# Patient Record
Sex: Female | Born: 1980 | Race: Black or African American | Hispanic: No | Marital: Married | Smoking: Never smoker
Health system: Southern US, Community
[De-identification: ages and names within clinical notes are randomized; demographics above are authoritative.]

## PROBLEM LIST (undated history)

## (undated) ENCOUNTER — Inpatient Hospital Stay (HOSPITAL_COMMUNITY): Payer: Self-pay

## (undated) ENCOUNTER — Emergency Department (HOSPITAL_COMMUNITY): Admission: EM | Payer: Medicaid Other | Source: Home / Self Care

## (undated) DIAGNOSIS — F329 Major depressive disorder, single episode, unspecified: Secondary | ICD-10-CM

## (undated) DIAGNOSIS — Z3490 Encounter for supervision of normal pregnancy, unspecified, unspecified trimester: Secondary | ICD-10-CM

## (undated) DIAGNOSIS — I10 Essential (primary) hypertension: Secondary | ICD-10-CM

## (undated) DIAGNOSIS — D573 Sickle-cell trait: Secondary | ICD-10-CM

## (undated) DIAGNOSIS — F32A Depression, unspecified: Secondary | ICD-10-CM

## (undated) HISTORY — PX: WISDOM TOOTH EXTRACTION: SHX21

## (undated) HISTORY — DX: Depression, unspecified: F32.A

## (undated) HISTORY — DX: Major depressive disorder, single episode, unspecified: F32.9

## (undated) HISTORY — PX: TUBAL LIGATION: SHX77

---
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
## 2000-02-29 ENCOUNTER — Inpatient Hospital Stay (HOSPITAL_COMMUNITY): Admission: EM | Admit: 2000-02-29 | Discharge: 2000-02-29 | Payer: Self-pay | Admitting: Obstetrics & Gynecology

## 2000-04-02 ENCOUNTER — Emergency Department (HOSPITAL_COMMUNITY): Admission: EM | Admit: 2000-04-02 | Discharge: 2000-04-02 | Payer: Self-pay

## 2000-04-19 ENCOUNTER — Emergency Department (HOSPITAL_COMMUNITY): Admission: EM | Admit: 2000-04-19 | Discharge: 2000-04-19 | Payer: Self-pay | Admitting: Emergency Medicine

## 2000-04-26 ENCOUNTER — Encounter: Admission: RE | Admit: 2000-04-26 | Discharge: 2000-04-26 | Payer: Self-pay | Admitting: Family Medicine

## 2000-05-25 ENCOUNTER — Encounter: Admission: RE | Admit: 2000-05-25 | Discharge: 2000-05-25 | Payer: Self-pay | Admitting: Family Medicine

## 2000-05-25 ENCOUNTER — Emergency Department (HOSPITAL_COMMUNITY): Admission: EM | Admit: 2000-05-25 | Discharge: 2000-05-25 | Payer: Self-pay | Admitting: Emergency Medicine

## 2000-11-07 ENCOUNTER — Emergency Department (HOSPITAL_COMMUNITY): Admission: EM | Admit: 2000-11-07 | Discharge: 2000-11-07 | Payer: Self-pay | Admitting: Emergency Medicine

## 2001-02-15 ENCOUNTER — Emergency Department (HOSPITAL_COMMUNITY): Admission: EM | Admit: 2001-02-15 | Discharge: 2001-02-15 | Payer: Self-pay | Admitting: Emergency Medicine

## 2001-02-16 ENCOUNTER — Inpatient Hospital Stay (HOSPITAL_COMMUNITY): Admission: AD | Admit: 2001-02-16 | Discharge: 2001-02-16 | Payer: Self-pay | Admitting: Obstetrics & Gynecology

## 2001-02-17 ENCOUNTER — Inpatient Hospital Stay (HOSPITAL_COMMUNITY): Admission: AD | Admit: 2001-02-17 | Discharge: 2001-02-17 | Payer: Self-pay | Admitting: *Deleted

## 2001-02-18 ENCOUNTER — Inpatient Hospital Stay (HOSPITAL_COMMUNITY): Admission: AD | Admit: 2001-02-18 | Discharge: 2001-02-18 | Payer: Self-pay | Admitting: *Deleted

## 2001-03-26 ENCOUNTER — Inpatient Hospital Stay (HOSPITAL_COMMUNITY): Admission: AD | Admit: 2001-03-26 | Discharge: 2001-03-26 | Payer: Self-pay | Admitting: Obstetrics & Gynecology

## 2001-04-19 ENCOUNTER — Encounter: Admission: RE | Admit: 2001-04-19 | Discharge: 2001-04-19 | Payer: Self-pay | Admitting: Obstetrics & Gynecology

## 2001-04-28 ENCOUNTER — Ambulatory Visit (HOSPITAL_COMMUNITY): Admission: RE | Admit: 2001-04-28 | Discharge: 2001-04-28 | Payer: Self-pay | Admitting: Obstetrics

## 2001-05-10 ENCOUNTER — Encounter: Admission: RE | Admit: 2001-05-10 | Discharge: 2001-05-10 | Payer: Self-pay | Admitting: Obstetrics & Gynecology

## 2001-05-20 ENCOUNTER — Inpatient Hospital Stay (HOSPITAL_COMMUNITY): Admission: AD | Admit: 2001-05-20 | Discharge: 2001-05-20 | Payer: Self-pay | Admitting: Obstetrics

## 2001-05-30 ENCOUNTER — Inpatient Hospital Stay (HOSPITAL_COMMUNITY): Admission: AD | Admit: 2001-05-30 | Discharge: 2001-05-30 | Payer: Self-pay | Admitting: *Deleted

## 2001-06-07 ENCOUNTER — Encounter: Admission: RE | Admit: 2001-06-07 | Discharge: 2001-06-07 | Payer: Self-pay | Admitting: Obstetrics & Gynecology

## 2001-06-21 ENCOUNTER — Encounter: Admission: RE | Admit: 2001-06-21 | Discharge: 2001-06-21 | Payer: Self-pay | Admitting: Obstetrics & Gynecology

## 2001-06-22 ENCOUNTER — Encounter: Admission: RE | Admit: 2001-06-22 | Discharge: 2001-06-22 | Payer: Self-pay | Admitting: Obstetrics

## 2001-06-28 ENCOUNTER — Encounter: Payer: Self-pay | Admitting: Obstetrics

## 2001-06-28 ENCOUNTER — Inpatient Hospital Stay (HOSPITAL_COMMUNITY): Admission: AD | Admit: 2001-06-28 | Discharge: 2001-06-28 | Payer: Self-pay | Admitting: Obstetrics

## 2001-07-06 ENCOUNTER — Encounter: Admission: RE | Admit: 2001-07-06 | Discharge: 2001-07-06 | Payer: Self-pay | Admitting: Obstetrics

## 2001-07-13 ENCOUNTER — Encounter: Admission: RE | Admit: 2001-07-13 | Discharge: 2001-07-13 | Payer: Self-pay | Admitting: Obstetrics

## 2001-07-18 ENCOUNTER — Inpatient Hospital Stay (HOSPITAL_COMMUNITY): Admission: RE | Admit: 2001-07-18 | Discharge: 2001-07-18 | Payer: Self-pay | Admitting: *Deleted

## 2001-07-20 ENCOUNTER — Encounter: Admission: RE | Admit: 2001-07-20 | Discharge: 2001-07-20 | Payer: Self-pay | Admitting: Obstetrics

## 2001-07-26 ENCOUNTER — Encounter: Admission: RE | Admit: 2001-07-26 | Discharge: 2001-07-26 | Payer: Self-pay | Admitting: Obstetrics & Gynecology

## 2001-08-03 ENCOUNTER — Encounter: Admission: RE | Admit: 2001-08-03 | Discharge: 2001-08-03 | Payer: Self-pay | Admitting: Obstetrics

## 2001-08-07 ENCOUNTER — Observation Stay (HOSPITAL_COMMUNITY): Admission: AD | Admit: 2001-08-07 | Discharge: 2001-08-08 | Payer: Self-pay | Admitting: Obstetrics

## 2001-08-09 ENCOUNTER — Encounter: Admission: RE | Admit: 2001-08-09 | Discharge: 2001-08-09 | Payer: Self-pay | Admitting: Obstetrics & Gynecology

## 2001-08-17 ENCOUNTER — Encounter: Admission: RE | Admit: 2001-08-17 | Discharge: 2001-08-17 | Payer: Self-pay | Admitting: Obstetrics

## 2001-08-24 ENCOUNTER — Encounter: Admission: RE | Admit: 2001-08-24 | Discharge: 2001-08-24 | Payer: Self-pay | Admitting: Obstetrics

## 2001-08-24 ENCOUNTER — Inpatient Hospital Stay (HOSPITAL_COMMUNITY): Admission: RE | Admit: 2001-08-24 | Discharge: 2001-08-24 | Payer: Self-pay | Admitting: Obstetrics

## 2001-08-31 ENCOUNTER — Inpatient Hospital Stay (HOSPITAL_COMMUNITY): Admission: AD | Admit: 2001-08-31 | Discharge: 2001-08-31 | Payer: Self-pay | Admitting: Obstetrics & Gynecology

## 2001-09-06 ENCOUNTER — Inpatient Hospital Stay: Admission: AD | Admit: 2001-09-06 | Discharge: 2001-09-06 | Payer: Self-pay | Admitting: Obstetrics

## 2001-09-12 ENCOUNTER — Encounter (HOSPITAL_COMMUNITY): Admission: RE | Admit: 2001-09-12 | Discharge: 2001-09-28 | Payer: Self-pay | Admitting: Obstetrics & Gynecology

## 2001-09-18 ENCOUNTER — Inpatient Hospital Stay (HOSPITAL_COMMUNITY): Admission: AD | Admit: 2001-09-18 | Discharge: 2001-09-18 | Payer: Self-pay | Admitting: Obstetrics

## 2001-09-28 ENCOUNTER — Encounter: Admission: RE | Admit: 2001-09-28 | Discharge: 2001-09-28 | Payer: Self-pay | Admitting: Obstetrics

## 2001-09-30 ENCOUNTER — Observation Stay (HOSPITAL_COMMUNITY): Admission: AD | Admit: 2001-09-30 | Discharge: 2001-10-01 | Payer: Self-pay | Admitting: *Deleted

## 2001-10-02 ENCOUNTER — Inpatient Hospital Stay (HOSPITAL_COMMUNITY): Admission: AD | Admit: 2001-10-02 | Discharge: 2001-10-02 | Payer: Self-pay | Admitting: Obstetrics

## 2001-10-03 ENCOUNTER — Encounter (INDEPENDENT_AMBULATORY_CARE_PROVIDER_SITE_OTHER): Payer: Self-pay

## 2001-10-03 ENCOUNTER — Inpatient Hospital Stay (HOSPITAL_COMMUNITY): Admission: AD | Admit: 2001-10-03 | Discharge: 2001-10-08 | Payer: Self-pay | Admitting: *Deleted

## 2001-10-26 ENCOUNTER — Inpatient Hospital Stay (HOSPITAL_COMMUNITY): Admission: AD | Admit: 2001-10-26 | Discharge: 2001-10-26 | Payer: Self-pay | Admitting: Obstetrics & Gynecology

## 2001-11-26 ENCOUNTER — Inpatient Hospital Stay (HOSPITAL_COMMUNITY): Admission: AD | Admit: 2001-11-26 | Discharge: 2001-11-26 | Payer: Self-pay | Admitting: Obstetrics

## 2002-01-20 ENCOUNTER — Emergency Department (HOSPITAL_COMMUNITY): Admission: EM | Admit: 2002-01-20 | Discharge: 2002-01-20 | Payer: Self-pay | Admitting: Emergency Medicine

## 2002-05-23 ENCOUNTER — Emergency Department (HOSPITAL_COMMUNITY): Admission: EM | Admit: 2002-05-23 | Discharge: 2002-05-23 | Payer: Self-pay | Admitting: Emergency Medicine

## 2002-06-22 ENCOUNTER — Emergency Department (HOSPITAL_COMMUNITY): Admission: EM | Admit: 2002-06-22 | Discharge: 2002-06-22 | Payer: Self-pay | Admitting: Emergency Medicine

## 2002-10-30 ENCOUNTER — Inpatient Hospital Stay (HOSPITAL_COMMUNITY): Admission: AD | Admit: 2002-10-30 | Discharge: 2002-10-30 | Payer: Self-pay | Admitting: *Deleted

## 2002-10-31 ENCOUNTER — Inpatient Hospital Stay (HOSPITAL_COMMUNITY): Admission: AD | Admit: 2002-10-31 | Discharge: 2002-10-31 | Payer: Self-pay | Admitting: Obstetrics and Gynecology

## 2002-10-31 ENCOUNTER — Encounter: Payer: Self-pay | Admitting: Obstetrics and Gynecology

## 2003-01-21 ENCOUNTER — Emergency Department (HOSPITAL_COMMUNITY): Admission: EM | Admit: 2003-01-21 | Discharge: 2003-01-21 | Payer: Self-pay | Admitting: Emergency Medicine

## 2003-01-21 ENCOUNTER — Encounter: Payer: Self-pay | Admitting: Emergency Medicine

## 2003-04-11 ENCOUNTER — Emergency Department (HOSPITAL_COMMUNITY): Admission: EM | Admit: 2003-04-11 | Discharge: 2003-04-11 | Payer: Self-pay | Admitting: Emergency Medicine

## 2003-04-17 ENCOUNTER — Encounter: Payer: Self-pay | Admitting: Obstetrics and Gynecology

## 2003-04-17 ENCOUNTER — Inpatient Hospital Stay (HOSPITAL_COMMUNITY): Admission: AD | Admit: 2003-04-17 | Discharge: 2003-04-17 | Payer: Self-pay | Admitting: Obstetrics and Gynecology

## 2003-05-08 ENCOUNTER — Inpatient Hospital Stay (HOSPITAL_COMMUNITY): Admission: AD | Admit: 2003-05-08 | Discharge: 2003-05-08 | Payer: Self-pay | Admitting: Obstetrics and Gynecology

## 2003-05-23 ENCOUNTER — Other Ambulatory Visit: Admission: RE | Admit: 2003-05-23 | Discharge: 2003-05-23 | Payer: Self-pay | Admitting: Obstetrics and Gynecology

## 2003-05-23 ENCOUNTER — Other Ambulatory Visit: Admission: RE | Admit: 2003-05-23 | Discharge: 2003-05-23 | Payer: Self-pay | Admitting: *Deleted

## 2003-06-07 ENCOUNTER — Inpatient Hospital Stay (HOSPITAL_COMMUNITY): Admission: AD | Admit: 2003-06-07 | Discharge: 2003-06-07 | Payer: Self-pay | Admitting: Obstetrics and Gynecology

## 2003-08-06 ENCOUNTER — Inpatient Hospital Stay (HOSPITAL_COMMUNITY): Admission: AD | Admit: 2003-08-06 | Discharge: 2003-08-06 | Payer: Self-pay | Admitting: Obstetrics and Gynecology

## 2003-10-07 ENCOUNTER — Emergency Department (HOSPITAL_COMMUNITY): Admission: EM | Admit: 2003-10-07 | Discharge: 2003-10-07 | Payer: Self-pay | Admitting: Emergency Medicine

## 2003-10-28 ENCOUNTER — Inpatient Hospital Stay (HOSPITAL_COMMUNITY): Admission: AD | Admit: 2003-10-28 | Discharge: 2003-10-28 | Payer: Self-pay | Admitting: Obstetrics and Gynecology

## 2003-12-02 ENCOUNTER — Inpatient Hospital Stay (HOSPITAL_COMMUNITY): Admission: AD | Admit: 2003-12-02 | Discharge: 2003-12-02 | Payer: Self-pay | Admitting: Obstetrics and Gynecology

## 2003-12-05 ENCOUNTER — Inpatient Hospital Stay (HOSPITAL_COMMUNITY): Admission: RE | Admit: 2003-12-05 | Discharge: 2003-12-08 | Payer: Self-pay | Admitting: Obstetrics and Gynecology

## 2003-12-12 ENCOUNTER — Encounter: Admission: RE | Admit: 2003-12-12 | Discharge: 2004-01-11 | Payer: Self-pay | Admitting: Obstetrics and Gynecology

## 2004-01-12 ENCOUNTER — Encounter: Admission: RE | Admit: 2004-01-12 | Discharge: 2004-02-11 | Payer: Self-pay | Admitting: Obstetrics and Gynecology

## 2004-03-14 ENCOUNTER — Encounter: Admission: RE | Admit: 2004-03-14 | Discharge: 2004-04-13 | Payer: Self-pay | Admitting: Obstetrics and Gynecology

## 2004-05-14 ENCOUNTER — Encounter: Admission: RE | Admit: 2004-05-14 | Discharge: 2004-06-13 | Payer: Self-pay | Admitting: Obstetrics and Gynecology

## 2004-06-04 ENCOUNTER — Emergency Department (HOSPITAL_COMMUNITY): Admission: EM | Admit: 2004-06-04 | Discharge: 2004-06-04 | Payer: Self-pay | Admitting: Family Medicine

## 2004-06-29 ENCOUNTER — Emergency Department (HOSPITAL_COMMUNITY): Admission: EM | Admit: 2004-06-29 | Discharge: 2004-06-29 | Payer: Self-pay | Admitting: Family Medicine

## 2005-03-01 ENCOUNTER — Emergency Department (HOSPITAL_COMMUNITY): Admission: EM | Admit: 2005-03-01 | Discharge: 2005-03-01 | Payer: Self-pay | Admitting: Family Medicine

## 2005-03-04 ENCOUNTER — Inpatient Hospital Stay (HOSPITAL_COMMUNITY): Admission: AD | Admit: 2005-03-04 | Discharge: 2005-03-04 | Payer: Self-pay | Admitting: *Deleted

## 2005-03-17 ENCOUNTER — Inpatient Hospital Stay (HOSPITAL_COMMUNITY): Admission: AD | Admit: 2005-03-17 | Discharge: 2005-03-17 | Payer: Self-pay | Admitting: Obstetrics and Gynecology

## 2005-04-18 ENCOUNTER — Emergency Department (HOSPITAL_COMMUNITY): Admission: EM | Admit: 2005-04-18 | Discharge: 2005-04-18 | Payer: Self-pay | Admitting: Emergency Medicine

## 2005-04-22 ENCOUNTER — Inpatient Hospital Stay (HOSPITAL_COMMUNITY): Admission: AD | Admit: 2005-04-22 | Discharge: 2005-04-22 | Payer: Self-pay | Admitting: Obstetrics & Gynecology

## 2005-05-07 ENCOUNTER — Inpatient Hospital Stay (HOSPITAL_COMMUNITY): Admission: AD | Admit: 2005-05-07 | Discharge: 2005-05-07 | Payer: Self-pay | Admitting: Obstetrics and Gynecology

## 2005-07-05 ENCOUNTER — Emergency Department (HOSPITAL_COMMUNITY): Admission: EM | Admit: 2005-07-05 | Discharge: 2005-07-05 | Payer: Self-pay | Admitting: Emergency Medicine

## 2005-07-08 ENCOUNTER — Inpatient Hospital Stay (HOSPITAL_COMMUNITY): Admission: AD | Admit: 2005-07-08 | Discharge: 2005-07-08 | Payer: Self-pay | Admitting: *Deleted

## 2005-07-08 ENCOUNTER — Ambulatory Visit: Payer: Self-pay | Admitting: Family Medicine

## 2005-07-18 ENCOUNTER — Ambulatory Visit: Payer: Self-pay | Admitting: Obstetrics and Gynecology

## 2005-07-18 ENCOUNTER — Inpatient Hospital Stay (HOSPITAL_COMMUNITY): Admission: AD | Admit: 2005-07-18 | Discharge: 2005-07-18 | Payer: Self-pay | Admitting: Obstetrics & Gynecology

## 2005-09-05 ENCOUNTER — Inpatient Hospital Stay (HOSPITAL_COMMUNITY): Admission: AD | Admit: 2005-09-05 | Discharge: 2005-09-05 | Payer: Self-pay | Admitting: Obstetrics & Gynecology

## 2005-09-09 ENCOUNTER — Encounter: Payer: Self-pay | Admitting: Family Medicine

## 2005-09-09 ENCOUNTER — Ambulatory Visit: Payer: Self-pay | Admitting: Family Medicine

## 2005-09-20 ENCOUNTER — Inpatient Hospital Stay (HOSPITAL_COMMUNITY): Admission: AD | Admit: 2005-09-20 | Discharge: 2005-09-20 | Payer: Self-pay | Admitting: Obstetrics and Gynecology

## 2005-09-20 ENCOUNTER — Ambulatory Visit: Payer: Self-pay | Admitting: Obstetrics and Gynecology

## 2005-10-05 ENCOUNTER — Ambulatory Visit: Payer: Self-pay | Admitting: *Deleted

## 2005-10-11 ENCOUNTER — Ambulatory Visit: Payer: Self-pay | Admitting: *Deleted

## 2005-10-11 ENCOUNTER — Encounter (INDEPENDENT_AMBULATORY_CARE_PROVIDER_SITE_OTHER): Payer: Self-pay | Admitting: Specialist

## 2005-10-11 ENCOUNTER — Inpatient Hospital Stay (HOSPITAL_COMMUNITY): Admission: RE | Admit: 2005-10-11 | Discharge: 2005-10-14 | Payer: Self-pay | Admitting: Family Medicine

## 2005-10-15 ENCOUNTER — Inpatient Hospital Stay (HOSPITAL_COMMUNITY): Admission: AD | Admit: 2005-10-15 | Discharge: 2005-10-15 | Payer: Self-pay | Admitting: *Deleted

## 2006-07-12 ENCOUNTER — Emergency Department (HOSPITAL_COMMUNITY): Admission: EM | Admit: 2006-07-12 | Discharge: 2006-07-12 | Payer: Self-pay | Admitting: Emergency Medicine

## 2006-10-07 ENCOUNTER — Emergency Department (HOSPITAL_COMMUNITY): Admission: EM | Admit: 2006-10-07 | Discharge: 2006-10-07 | Payer: Self-pay | Admitting: Emergency Medicine

## 2006-10-19 ENCOUNTER — Emergency Department (HOSPITAL_COMMUNITY): Admission: EM | Admit: 2006-10-19 | Discharge: 2006-10-19 | Payer: Self-pay | Admitting: Emergency Medicine

## 2006-11-07 ENCOUNTER — Emergency Department (HOSPITAL_COMMUNITY): Admission: EM | Admit: 2006-11-07 | Discharge: 2006-11-07 | Payer: Self-pay | Admitting: Family Medicine

## 2006-11-23 ENCOUNTER — Emergency Department (HOSPITAL_COMMUNITY): Admission: EM | Admit: 2006-11-23 | Discharge: 2006-11-23 | Payer: Self-pay | Admitting: Emergency Medicine

## 2007-01-30 ENCOUNTER — Emergency Department (HOSPITAL_COMMUNITY): Admission: EM | Admit: 2007-01-30 | Discharge: 2007-01-30 | Payer: Self-pay | Admitting: Family Medicine

## 2007-03-08 ENCOUNTER — Inpatient Hospital Stay (HOSPITAL_COMMUNITY): Admission: AD | Admit: 2007-03-08 | Discharge: 2007-03-08 | Payer: Self-pay | Admitting: Gynecology

## 2007-03-09 ENCOUNTER — Inpatient Hospital Stay (HOSPITAL_COMMUNITY): Admission: AD | Admit: 2007-03-09 | Discharge: 2007-03-09 | Payer: Self-pay | Admitting: Gynecology

## 2007-03-09 ENCOUNTER — Emergency Department (HOSPITAL_COMMUNITY): Admission: EM | Admit: 2007-03-09 | Discharge: 2007-03-09 | Payer: Self-pay | Admitting: Family Medicine

## 2007-03-11 ENCOUNTER — Emergency Department (HOSPITAL_COMMUNITY): Admission: EM | Admit: 2007-03-11 | Discharge: 2007-03-12 | Payer: Self-pay | Admitting: Emergency Medicine

## 2007-03-11 ENCOUNTER — Inpatient Hospital Stay (HOSPITAL_COMMUNITY): Admission: AD | Admit: 2007-03-11 | Discharge: 2007-03-11 | Payer: Self-pay | Admitting: Family Medicine

## 2007-03-14 ENCOUNTER — Inpatient Hospital Stay (HOSPITAL_COMMUNITY): Admission: AD | Admit: 2007-03-14 | Discharge: 2007-03-14 | Payer: Self-pay | Admitting: Gynecology

## 2007-03-22 ENCOUNTER — Emergency Department (HOSPITAL_COMMUNITY): Admission: EM | Admit: 2007-03-22 | Discharge: 2007-03-22 | Payer: Self-pay | Admitting: Emergency Medicine

## 2007-04-03 ENCOUNTER — Emergency Department (HOSPITAL_COMMUNITY): Admission: EM | Admit: 2007-04-03 | Discharge: 2007-04-03 | Payer: Self-pay | Admitting: Emergency Medicine

## 2007-04-04 ENCOUNTER — Other Ambulatory Visit: Admission: RE | Admit: 2007-04-04 | Discharge: 2007-04-04 | Payer: Self-pay | Admitting: Obstetrics and Gynecology

## 2007-05-19 ENCOUNTER — Inpatient Hospital Stay (HOSPITAL_COMMUNITY): Admission: AD | Admit: 2007-05-19 | Discharge: 2007-05-19 | Payer: Self-pay | Admitting: Obstetrics and Gynecology

## 2007-05-31 ENCOUNTER — Inpatient Hospital Stay (HOSPITAL_COMMUNITY): Admission: AD | Admit: 2007-05-31 | Discharge: 2007-05-31 | Payer: Self-pay | Admitting: Obstetrics and Gynecology

## 2007-06-09 ENCOUNTER — Ambulatory Visit (HOSPITAL_COMMUNITY): Admission: RE | Admit: 2007-06-09 | Discharge: 2007-06-09 | Payer: Self-pay | Admitting: Obstetrics and Gynecology

## 2007-06-22 ENCOUNTER — Emergency Department (HOSPITAL_COMMUNITY): Admission: EM | Admit: 2007-06-22 | Discharge: 2007-06-22 | Payer: Self-pay | Admitting: Emergency Medicine

## 2007-06-26 ENCOUNTER — Ambulatory Visit (HOSPITAL_COMMUNITY): Admission: RE | Admit: 2007-06-26 | Discharge: 2007-06-26 | Payer: Self-pay | Admitting: Obstetrics and Gynecology

## 2007-07-07 ENCOUNTER — Inpatient Hospital Stay (HOSPITAL_COMMUNITY): Admission: AD | Admit: 2007-07-07 | Discharge: 2007-07-07 | Payer: Self-pay | Admitting: Obstetrics and Gynecology

## 2007-07-20 ENCOUNTER — Emergency Department (HOSPITAL_COMMUNITY): Admission: EM | Admit: 2007-07-20 | Discharge: 2007-07-20 | Payer: Self-pay | Admitting: Emergency Medicine

## 2007-08-30 ENCOUNTER — Inpatient Hospital Stay (HOSPITAL_COMMUNITY): Admission: AD | Admit: 2007-08-30 | Discharge: 2007-08-30 | Payer: Self-pay | Admitting: Obstetrics and Gynecology

## 2007-09-21 ENCOUNTER — Emergency Department (HOSPITAL_COMMUNITY): Admission: EM | Admit: 2007-09-21 | Discharge: 2007-09-21 | Payer: Self-pay | Admitting: Emergency Medicine

## 2007-10-30 ENCOUNTER — Encounter (INDEPENDENT_AMBULATORY_CARE_PROVIDER_SITE_OTHER): Payer: Self-pay | Admitting: Obstetrics and Gynecology

## 2007-10-30 ENCOUNTER — Inpatient Hospital Stay (HOSPITAL_COMMUNITY): Admission: RE | Admit: 2007-10-30 | Discharge: 2007-11-05 | Payer: Self-pay | Admitting: Obstetrics and Gynecology

## 2007-12-14 ENCOUNTER — Inpatient Hospital Stay (HOSPITAL_COMMUNITY): Admission: AD | Admit: 2007-12-14 | Discharge: 2007-12-14 | Payer: Self-pay | Admitting: Obstetrics and Gynecology

## 2008-02-05 ENCOUNTER — Emergency Department (HOSPITAL_COMMUNITY): Admission: EM | Admit: 2008-02-05 | Discharge: 2008-02-05 | Payer: Self-pay | Admitting: Emergency Medicine

## 2008-02-19 ENCOUNTER — Emergency Department (HOSPITAL_COMMUNITY): Admission: EM | Admit: 2008-02-19 | Discharge: 2008-02-19 | Payer: Self-pay | Admitting: Emergency Medicine

## 2008-05-05 ENCOUNTER — Emergency Department (HOSPITAL_COMMUNITY): Admission: EM | Admit: 2008-05-05 | Discharge: 2008-05-05 | Payer: Self-pay | Admitting: Emergency Medicine

## 2008-08-01 ENCOUNTER — Emergency Department (HOSPITAL_COMMUNITY): Admission: EM | Admit: 2008-08-01 | Discharge: 2008-08-01 | Payer: Self-pay | Admitting: Emergency Medicine

## 2008-10-15 ENCOUNTER — Emergency Department (HOSPITAL_COMMUNITY): Admission: EM | Admit: 2008-10-15 | Discharge: 2008-10-15 | Payer: Self-pay | Admitting: Emergency Medicine

## 2008-11-19 ENCOUNTER — Emergency Department (HOSPITAL_COMMUNITY): Admission: EM | Admit: 2008-11-19 | Discharge: 2008-11-19 | Payer: Self-pay | Admitting: Emergency Medicine

## 2008-12-14 ENCOUNTER — Inpatient Hospital Stay (HOSPITAL_COMMUNITY): Admission: AD | Admit: 2008-12-14 | Discharge: 2008-12-14 | Payer: Self-pay | Admitting: Obstetrics & Gynecology

## 2009-02-22 ENCOUNTER — Ambulatory Visit: Payer: Self-pay | Admitting: Obstetrics and Gynecology

## 2009-02-22 ENCOUNTER — Inpatient Hospital Stay (HOSPITAL_COMMUNITY): Admission: AD | Admit: 2009-02-22 | Discharge: 2009-02-22 | Payer: Self-pay | Admitting: Obstetrics & Gynecology

## 2009-03-09 ENCOUNTER — Inpatient Hospital Stay (HOSPITAL_COMMUNITY): Admission: AD | Admit: 2009-03-09 | Discharge: 2009-03-09 | Payer: Self-pay | Admitting: Obstetrics and Gynecology

## 2009-03-11 ENCOUNTER — Inpatient Hospital Stay (HOSPITAL_COMMUNITY): Admission: AD | Admit: 2009-03-11 | Discharge: 2009-03-11 | Payer: Self-pay | Admitting: Obstetrics & Gynecology

## 2009-05-13 ENCOUNTER — Inpatient Hospital Stay (HOSPITAL_COMMUNITY): Admission: AD | Admit: 2009-05-13 | Discharge: 2009-05-13 | Payer: Self-pay | Admitting: Obstetrics and Gynecology

## 2009-05-26 ENCOUNTER — Ambulatory Visit: Payer: Self-pay | Admitting: Physician Assistant

## 2009-05-26 ENCOUNTER — Inpatient Hospital Stay (HOSPITAL_COMMUNITY): Admission: AD | Admit: 2009-05-26 | Discharge: 2009-05-26 | Payer: Self-pay | Admitting: Obstetrics & Gynecology

## 2009-06-11 ENCOUNTER — Inpatient Hospital Stay (HOSPITAL_COMMUNITY): Admission: AD | Admit: 2009-06-11 | Discharge: 2009-06-11 | Payer: Self-pay | Admitting: Obstetrics and Gynecology

## 2009-07-29 ENCOUNTER — Inpatient Hospital Stay (HOSPITAL_COMMUNITY): Admission: RE | Admit: 2009-07-29 | Discharge: 2009-08-02 | Payer: Self-pay | Admitting: Obstetrics and Gynecology

## 2009-08-08 ENCOUNTER — Emergency Department (HOSPITAL_COMMUNITY): Admission: EM | Admit: 2009-08-08 | Discharge: 2009-08-08 | Payer: Self-pay | Admitting: Emergency Medicine

## 2009-08-10 ENCOUNTER — Inpatient Hospital Stay (HOSPITAL_COMMUNITY): Admission: AD | Admit: 2009-08-10 | Discharge: 2009-08-10 | Payer: Self-pay | Admitting: Obstetrics and Gynecology

## 2009-11-23 ENCOUNTER — Emergency Department (HOSPITAL_COMMUNITY): Admission: EM | Admit: 2009-11-23 | Discharge: 2009-11-23 | Payer: Self-pay | Admitting: Family Medicine

## 2009-12-26 ENCOUNTER — Emergency Department (HOSPITAL_COMMUNITY): Admission: EM | Admit: 2009-12-26 | Discharge: 2009-12-26 | Payer: Self-pay | Admitting: Family Medicine

## 2010-05-03 ENCOUNTER — Emergency Department (HOSPITAL_COMMUNITY): Admission: EM | Admit: 2010-05-03 | Discharge: 2010-05-03 | Payer: Self-pay | Admitting: Emergency Medicine

## 2010-05-04 ENCOUNTER — Emergency Department (HOSPITAL_COMMUNITY): Admission: EM | Admit: 2010-05-04 | Discharge: 2010-05-04 | Payer: Self-pay | Admitting: Emergency Medicine

## 2010-06-11 ENCOUNTER — Emergency Department (HOSPITAL_COMMUNITY): Admission: EM | Admit: 2010-06-11 | Discharge: 2010-06-11 | Payer: Self-pay | Admitting: Emergency Medicine

## 2010-11-19 ENCOUNTER — Emergency Department (HOSPITAL_COMMUNITY): Admission: EM | Admit: 2010-11-19 | Discharge: 2010-07-14 | Payer: Self-pay | Admitting: Emergency Medicine

## 2010-12-12 ENCOUNTER — Emergency Department (HOSPITAL_COMMUNITY)
Admission: EM | Admit: 2010-12-12 | Discharge: 2010-12-12 | Payer: Self-pay | Source: Home / Self Care | Admitting: Emergency Medicine

## 2011-01-03 ENCOUNTER — Encounter: Payer: Self-pay | Admitting: *Deleted

## 2011-02-26 LAB — RAPID URINE DRUG SCREEN, HOSP PERFORMED
Amphetamines: NOT DETECTED
Benzodiazepines: NOT DETECTED
Tetrahydrocannabinol: NOT DETECTED

## 2011-03-20 LAB — CBC
HCT: 26.3 % — ABNORMAL LOW (ref 36.0–46.0)
HCT: 39.9 % (ref 36.0–46.0)
Hemoglobin: 13.5 g/dL (ref 12.0–15.0)
MCHC: 33.8 g/dL (ref 30.0–36.0)
MCHC: 34.3 g/dL (ref 30.0–36.0)
MCV: 85.8 fL (ref 78.0–100.0)
Platelets: 146 10*3/uL — ABNORMAL LOW (ref 150–400)
RBC: 3.07 MIL/uL — ABNORMAL LOW (ref 3.87–5.11)
RBC: 4.66 MIL/uL (ref 3.87–5.11)
RDW: 14.6 % (ref 11.5–15.5)
WBC: 8.4 10*3/uL (ref 4.0–10.5)

## 2011-03-20 LAB — WOUND CULTURE: Gram Stain: NONE SEEN

## 2011-03-22 LAB — URINALYSIS, ROUTINE W REFLEX MICROSCOPIC
Bilirubin Urine: NEGATIVE
Glucose, UA: NEGATIVE mg/dL
Hgb urine dipstick: NEGATIVE
Ketones, ur: NEGATIVE mg/dL
Nitrite: NEGATIVE
Protein, ur: NEGATIVE mg/dL
Specific Gravity, Urine: 1.02 (ref 1.005–1.030)
Urobilinogen, UA: 0.2 mg/dL (ref 0.0–1.0)
pH: 6 (ref 5.0–8.0)

## 2011-03-25 LAB — CBC
HCT: 35.8 % — ABNORMAL LOW (ref 36.0–46.0)
MCHC: 33.2 g/dL (ref 30.0–36.0)
MCV: 85.3 fL (ref 78.0–100.0)
Platelets: 261 10*3/uL (ref 150–400)
Platelets: 275 10*3/uL (ref 150–400)
RBC: 4.3 MIL/uL (ref 3.87–5.11)
RBC: 3.96 MIL/uL (ref 3.87–5.11)
RDW: 14.1 % (ref 11.5–15.5)
RDW: 14.5 % (ref 11.5–15.5)
WBC: 6.6 10*3/uL (ref 4.0–10.5)
WBC: 7.4 10*3/uL (ref 4.0–10.5)

## 2011-03-25 LAB — URINALYSIS, ROUTINE W REFLEX MICROSCOPIC
Bilirubin Urine: NEGATIVE
Glucose, UA: NEGATIVE mg/dL
Hgb urine dipstick: NEGATIVE
Ketones, ur: NEGATIVE mg/dL
Specific Gravity, Urine: 1.015 (ref 1.005–1.030)
pH: 5.5 (ref 5.0–8.0)
pH: 6 (ref 5.0–8.0)

## 2011-03-25 LAB — URINE CULTURE
Colony Count: NO GROWTH
Culture: NO GROWTH

## 2011-03-25 LAB — GC/CHLAMYDIA PROBE AMP, GENITAL
Chlamydia, DNA Probe: NEGATIVE
GC Probe Amp, Genital: NEGATIVE

## 2011-03-25 LAB — RAPID URINE DRUG SCREEN, HOSP PERFORMED
Amphetamines: NOT DETECTED
Barbiturates: NOT DETECTED
Benzodiazepines: NOT DETECTED
Cocaine: NOT DETECTED
Opiates: NOT DETECTED

## 2011-03-25 LAB — COMPREHENSIVE METABOLIC PANEL
AST: 23 U/L (ref 0–37)
CO2: 26 mEq/L (ref 19–32)
Calcium: 9 mg/dL (ref 8.4–10.5)
Creatinine, Ser: 0.54 mg/dL (ref 0.4–1.2)
GFR calc Af Amer: >60 mL/min (ref >60)
GFR calc non Af Amer: >60 mL/min (ref >60)
Glucose, Bld: 76 mg/dL (ref 70–99)
Total Protein: 7.1 g/dL (ref 6.0–8.3)

## 2011-03-25 LAB — DIFFERENTIAL
Basophils Absolute: 0 10*3/uL (ref 0.0–0.1)
Eosinophils Relative: 2 % (ref 0–5)
Lymphocytes Relative: 17 % (ref 12–46)
Lymphocytes Relative: 21 % (ref 12–46)
Lymphs Abs: 1.5 10*3/uL (ref 0.7–4.0)
Neutro Abs: 4.7 10*3/uL (ref 1.7–7.7)
Neutrophils Relative %: 71 % (ref 43–77)
Neutrophils Relative %: 71 % (ref 43–77)

## 2011-03-25 LAB — WET PREP, GENITAL
Clue Cells Wet Prep HPF POC: NONE SEEN
Trich, Wet Prep: NONE SEEN

## 2011-03-25 LAB — URINE MICROSCOPIC-ADD ON

## 2011-03-29 LAB — WET PREP, GENITAL
Clue Cells Wet Prep HPF POC: NONE SEEN
Yeast Wet Prep HPF POC: NONE SEEN

## 2011-03-29 LAB — URINALYSIS, ROUTINE W REFLEX MICROSCOPIC
Glucose, UA: NEGATIVE mg/dL
Hgb urine dipstick: NEGATIVE
Specific Gravity, Urine: 1.02 (ref 1.005–1.030)
Urobilinogen, UA: 0.2 mg/dL (ref 0.0–1.0)
pH: 5.5 (ref 5.0–8.0)

## 2011-03-29 LAB — POCT PREGNANCY, URINE: Preg Test, Ur: POSITIVE

## 2011-04-27 NOTE — Op Note (Signed)
[name] [name], [name] [name]               ACCOUNT NO.:  [id number]   MEDICAL RECORD NO.:  [id number]          PATIENT TYPE:  INP   LOCATION:  9372                          FACILITY:  [facility]   PHYSICIAN:  Rudy Jew. Ashley Royalty, M.D.DATE OF BIRTH:  Dec 27, 1980   DATE OF PROCEDURE:  10/30/2007  DATE OF DISCHARGE:                               OPERATIVE REPORT   PREOPERATIVE DIAGNOSES:  1. Intrauterine pregnancy at [redacted] weeks gestation.  2. Previous cesarean section x3.   POSTOPERATIVE DIAGNOSES:  1. Intrauterine pregnancy at [redacted] weeks gestation.  2. Adhesions - dense from the uterus to the anterior abdominal wall.   PROCEDURE:  Repeat low transverse cesarean section.   SURGEON:  Sylvester Harder, M.D.   ANESTHESIA:  Spinal.   FINDINGS:  A 6 pound 7 ounces female, Apgar's 9 at 1 minute and 9 at 5  minutes sent to newborn nursery.   ESTIMATED BLOOD LOSS:   900 ML DRAINS:  1. Foley.  2. Jackson-Pratt to the anterior cul-de-sac.   PROCEDURE:  The patient was taken to the operating room and placed in  the sitting position.  After spinal anesthetic was administered she was  placed in the dorsal supine position, prepped and draped in the usual  manner for abdominal surgery.  Foley catheter was placed.  A  Pfannenstiel incision was made down to the level of the fascia which was  nicked with a knife and incised transversely with Mayo scissors.  The  underlying rectus muscles were separated from the fascia using sharp and  blunt dissection.  The rectus muscles were separated in the midline.  An  attempt was made to enter the peritoneal cavity, but this was not  possible in the fundal area.  It became apparent that the uterus was  adherent to the anterior abdominal wall.  There was no discernible  intervening peritoneum.  Dissection was carried inferiorly.  It was  possible to enter the peritoneal cavity below the fundal area.  It was  necessary to dissect the anterior aspect of the uterus free from  the  anterior abdominal wall in order to accomplish the cesarean section.  This was accomplished completely on the right and nearly completely on  the left.  A bladder flap was created by incising the intrauterine  serosa and sharply and bluntly dissecting the bladder inferiorly.  It  was held in place with a bladder blade.  The uterus was then entered  through a low transverse incision through sharp and blunt dissection.  The fluid was clear.  The infant was delivered from a vertex  presentation.  The infant was suctioned.  The cord was doubly clamped,  cut and the infant given immediately to the awaiting pediatrics team.  The placenta and membranes were removed in their entirety and submitted  to pathology for histologic studies.  The uterus was then closed in a  running locking layer of #1 Vicryl.  Additional figure-of-eight sutures  were required to obtain hemostasis.  At this point the anterior fundal  aspect of the uterus in the area of the adhesions to the anterior  abdominal  wall was bleeding from numerous sites.  It was reconstructed  as best as possible using a combination of interrupted figure-of-eight  sutures as well as running sutures of 0 chromic catgut.  Ultimately the  peritoneal cavity was successfully entered after the superior adhesions  were lysed and the adnexa were visualized.  There were both noted to be  normal.  Hemostasis was difficult to achieve due to the raw surface area  left over from the previous adhesions and the necessary dissection.  Ultimately it was felt that the patient was adequately hemostatic to  close.  Surgicel was placed over the denuded area and a Jackson-Pratt  drain was brought in to drain the area immediately anterior to the  uterus and brought out through a separate stab incision.  As mentioned,  hemostasis appeared to be adequate to close and closure was initiated.  The fascia was closed 0 Vicryl in a running fashion.  The skin was   closed with staples.  At the conclusion of the procedure, the urine was  clear and copious with only a modest amount of fluid in the [employer] grenade.  The patient was then taken to the recovery room in  satisfactory condition.      James A. Ashley Royalty, M.D.  Electronically Signed     JAM/MEDQ  D:  10/30/2007  T:  10/31/2007  Job:  [id number]

## 2011-04-27 NOTE — H&P (Signed)
[name] [name], [name] [name]               ACCOUNT NO.:  [id number]   MEDICAL RECORD NO.:  [id number]          PATIENT TYPE:  INP   LOCATION:  9199                          FACILITY:  [facility]   PHYSICIAN:  Rudy Jew. Ashley Royalty, M.D.DATE OF BIRTH:  02-Jul-1981   DATE OF ADMISSION:  10/30/2007  DATE OF DISCHARGE:                              HISTORY & PHYSICAL   This is a 30 year old, gravida 5, para 3-0-1-3, [facility] November 06, 2007,  [redacted] weeks gestation. The patient has had 3 previous C sections. She has  sickle trait. The pregnancy has also been complicated by noncompliance  with her care. She is for repeat cesarean section.   MEDICATIONS:  Vitamins.   PAST MEDICAL HISTORY:  As above.   PAST SURGICAL HISTORY:  As above.   ALLERGIES:  None.   FAMILY HISTORY:  Noncontributory.   SOCIAL HISTORY:  The patient denies the use of tobacco or significant  alcohol.   REVIEW OF SYSTEMS:  Noncontributory.   PHYSICAL EXAMINATION:  Well-developed, well-nourished, pleasant, black  female in no acute distress.  Afebrile, vital signs stable.  CHEST:  Lungs are clear.  CARDIAC:  Regular rate and rhythm.  ABDOMEN:  Gravid with a term fundal height. Fetal heart tones are  auscultated.  MUSCULOSKELETAL:  No significant edema.  PELVIC:  Deferred.   IMPRESSION:  1. Intrauterine pregnancy at [redacted] weeks gestation.  2. Previous cesarean section x3.  3. Sickle trait.  4. History of noncompliance with OB/GYN care.   PLAN:  Repeat low transverse cesarean section. The risks and benefits,  complications, and alternatives were fully discussed with the patient.  She states she understands and accepts. Questions were invited and  answered.      James A. Ashley Royalty, M.D.  Electronically Signed     JAM/MEDQ  D:  10/30/2007  T:  10/30/2007  Job:  [id number]

## 2011-04-27 NOTE — Op Note (Signed)
[name] [name], [name] [name]            ACCOUNT NO.:  [id number]   MEDICAL RECORD NO.:  [id number]          PATIENT TYPE:  INP   LOCATION:  9130                          [facility]ACILITY:  [facility]   PHYSICIAN:  Malva Limes, M.D.    DATE OF BIRTH:  1981/01/07   DATE OF PROCEDURE:  07/29/2009  DATE OF DISCHARGE:                               OPERATIVE REPORT   PREOPERATIVE DIAGNOSES:  1. Intrauterine pregnancy at term.  2. History of prior cesarean section x4.  3. The patient desires repeat cesarean section.   POSTOPERATIVE DIAGNOSES:  1. Intrauterine pregnancy at term.  2. History of prior cesarean section x4.  3. The patient desires repeat cesarean section.  4. Dense adhesions.   PROCEDURE:  1. Repeat low transverse cesarean section.  2. Lysis of adhesions.   SURGEON:  Malva Limes, MD   ANESTHESIA:  Spinal.   ANTIBIOTICS:  Ancef 1 g.   ESTIMATED BLOOD LOSS:  900 mL.   SPECIMENS:  None.   COMPLICATIONS:  None.   FINDINGS:  The patient had multiple omental adhesions to the anterior  abdominal wall.  Also, the uterus was adherent to the anterior abdominal  wall with thick, dense adhesions.   PROCEDURE:  The patient was taken to the operating room where spinal  anesthetic was administered without difficulty.  She was then placed in  dorsal supine position with a left lateral tilt.  Once an adequate level  was reached, the patient was prepped and draped in usual fashion for  this procedure.  A Foley catheter was placed.  A Pfannenstiel incision  was made through the previous scar.  On reaching the fascia, it was  noted that there was omentum coming through a fascia window in the  superior margin of the incision.  This was taken down with the Bovie.  Once this was completed, it was noted that the uterus was adherent to  the anterior abdominal wall with thick, dense adhesions in multiple  areas.  These were all taken down with the Bovie.  Once this was  accomplished, a low-transverse  uterine incision was made in the midline  and extended laterally with blunt dissection.  Amniotic fluid was noted  to be clear.  The infant was delivered in vertex presentation.  On  delivery of the head, the oropharynx and nostrils bulb suctioned.  The  cord doubly clamped and cut and the infant handed to awaiting NICU team.  The placenta was manually removed.  The uterus was exteriorized and  wiped with wet lap.  Uterine cavity appeared to be normal.  The incision  was closed in a single layer of 0 Monocryl suture in a running locking  fashion.  The remaining omental adhesions were taken down with Bovie.  Once this was accomplished, the rectus muscles and the peritoneum were  reapproximated in the midline using 2-0 Monocryl in an interrupted  fashion.  The fascia was closed using 0 Monocryl suture in a running  fashion.  Fascia was made hemostatic with Bovie.  Stainless clips used  to close the skin.  The patient tolerated procedure well.  She was  taken  to recovery room in stable condition.  Instrument counts correct x2.           ______________________________  Malva Limes, M.D.     MA/MEDQ  D:  07/29/2009  T:  07/30/2009  Job:  [id number]

## 2011-04-30 NOTE — Op Note (Signed)
[facility]  Patient:    [name] [name], [name] [name] Visit Number: [id number] MRN: [id number]          Service Type: OBS Location: 9400 [id number] Attending Physician:  Michaelle Copas Dictated by:   Bing Neighbors Clearance Coots, M.D. Proc. Date: 10/05/01 Admit Date:  10/03/2001                             Operative Report  PREOPERATIVE DIAGNOSES:       Arrest of first stage of labor, preeclampsia, oliguria.  POSTOPERATIVE DIAGNOSES:      Arrest of first stage of labor, preeclampsia, oliguria, occiput posterior position.  PROCEDURE:                    Primary low transverse cesarean section.  SURGEON:                      Charles A. Clearance Coots, M.D.  ASSISTANT:                    Ed Blalock. Burnadette Peter, M.D.  ANESTHESIA:                   Epidural.  ESTIMATED BLOOD LOSS:         800 ml.  INTRAVENOUS FLUIDS:           1800 ml.  URINE OUTPUT:                 300 ml concentrated.  COMPLICATIONS:                None.  DRAINS:                       Foley to gravity.  FINDINGS:                     Viable female at 2353.  Apgars of 5 at one minute, 8 at five minutes.  Weight 7 pounds 11 ounces.  Cord pH 7.30.  Normal uterus, ovaries, and fallopian tubes.  OPERATION:                    The patient was brought to the operating room and after satisfactory redosing of the epidural the abdomen was prepped and draped in the usual sterile fashion.  Pfannenstiel skin incision was made with the scalpel that was deep and down to the fascia with the scalpel.  The fascia was nicked in the midline.  The fascial incision was extended to the left and to the right with curved Mayo scissors.  The superior and inferior fascial edges were taken off of the rectus muscle with both blunt and sharp dissection.  Rectus muscle was bluntly and sharply divided in the midline. Peritoneum was entered digitally and was digitally extended to the left and to the right.  Bladder blade was positioned  and the vesicouterine fold of peritoneum above the reflection of the urinary bladder was grasped with forceps and was incised and undermined with Metzenbaum scissors.  The incision was extended to the left and to the right with the Metzenbaum scissors.  The bladder flap was bluntly developed and the bladder blade was repositioned in front of the urinary bladder, placing it well out of the operative field. Uterus was entered in the lower uterine segment transversely with the scalpel. Clear amniotic fluid was expelled.  The  uterine incision was extended to the left and to the right with the bandage scissors.  The vertex was noted to be left occiput posterior in position.  The occiput was elevated into the incision and rotated to the occiput anterior position and was delivered with the aid of fundal pressure from the assistant.  The delivery was then completed with the aid of fundal pressure from the assistant.  Infants mouth and nose were suctioned with the suction bulb.  The umbilical cord was doubly clamped and cut and the infant was handed off to the nursing staff.  Cord pH and cord blood were obtained.  Placenta was then spontaneously expelled from the uterine cavity intact.  The uterus was exteriorized and the endometrial surface was thoroughly debrided with a dry lap sponge.  The edges of the uterine incision were grasped with ring forceps and the uterus was closed with a continuous interlocking suture of 0 Monocryl from each corner to the center. Hemostasis was excellent.  The uterus was placed back in its normal anatomic position.  Pelvic cavity was thoroughly irrigated with warm saline solution and all clots were removed.  Closure of the uterus was again observed for hemostasis and there was no active bleeding noted.  The abdomen was then closed as follows.  The fascia was closed with a continuous suture of 0 PDS from each corner to the center.  Subcutaneous tissue was thoroughly  irrigated with warm saline solution and all areas of subcutaneous bleeding were coagulated with the Bovie.  Skin was then approximated with surgical stainless steel staples.  Surgical technician indicated that all needle, sponge, and instrument counts were correct.  Sterile bandage was applied to the incision closure.  The patient tolerated the procedure well.  Was transported to the recovery room in satisfactory condition. Dictated by:   Bing Neighbors Clearance Coots, M.D. Attending Physician:  Michaelle Copas DD:  10/05/01 TD:  10/06/01 Job: [id number]

## 2011-04-30 NOTE — Discharge Summary (Signed)
[facility]  Patient:    [name] [name], [name] [name] Visit Number: [id number] MRN: [id number]          Service Type: EMS Location: [facility] Attending Physician:  Nelia Shi Dictated by:   Andrey Spearman, M.D. Admit Date:  01/20/2002 Discharge Date: 01/20/2002                             Discharge Summary  DISCHARGE DIAGNOSES:          1. Status post preeclampsia.                               2. Status post low transverse cesarean section                                  for nonfunctional labor and failure to                                  progress productive of a viable female infant.                               3. Status post maternal fever during labor and                                  group B Strep positivity treated with                                  antibiotics.  DISCHARGE MEDICATIONS:        1. Tylox one to two p.o. q.4-6h. p.r.n.                               2. Ibuprofen 600 mg q.6h. p.r.n.                               3. Prenatal vitamins one p.o. q.d.                               4. Ferrous sulfate 325 mg one p.o. b.i.d.  BRIEF HISTORY AND PHYSICAL:   This is a 30 year old G1, now P1-0-0-1 who presented at 40 and 2 weeks for induction of labor for post dates and preeclampsia.  On admission she was having contractions every three minutes with good fetal activity.  Her membranes were intact.  She denied any symptoms of preeclampsia including headache, visual changes, or right upper quadrant pain.  Her prenatal course had been significant for sickle cell trait and multiple AMA hospital leaves and refusing examinations and induction of labor. On examination her cervix was fingertip, long, and high.  Fetal heart rate was baseline of 150 with good variability and reactivity and no decelerations. Her [facility] laboratories showed platelets 162,000.  No urine protein.  Uric acid 6.8, AST 38, ALT 19, LDH 239.  The patient was admitted as planned  for induction of labor.  HOSPITAL  COURSE:              Patient was admitted.  She was given Cytotec x1 for induction of labor.  Her cervix at the time of placement of Cytotec was a loose 1, 60%, and -2.  She was having contractions every one to two minutes. Therefore, after the Cytotec she was allowed to labor on her own with blood pressures in the 140s-150s/80s-90s.  After approximately two hours her cervix was a tight 2, 80%, and -2 with a bulging bag of water.  Therefore, she underwent artificial rupture of membranes with clear fluid and Ancef was started for group B Strep positivity.  After six hours her cervix had progressed to 3-4, 100%, -1 and she continued to labor naturally with contractions every one to two minutes.  After an additional four hours she did have a temperature to 101.1 so the Ancef was changed to Unasyn and at this point her cervix had not changed.  She also was started on magnesium when her cervix reached 4 cm for preeclampsia.  After approximately 10 hours in labor her cervix still had not changed so she was started on low dose Pitocin to try to augment her labor, continued on Unasyn and magnesium.  Her cervix had progressed to 8, 100%, at a 0 station.  Pitocin was continued as well as magnesium and antibiotics.  However, after an additional several hours her cervix had not progressed.  Her urine output had decreased and she continued with high blood pressures.  For this reason she was diagnosed with abnormal labor, decreased fetal variability, and failure to progress and a low transverse cesarean section was performed (please see operative note) with production of a viable female with Apgars 5 and 8 and a cord pH of 7.30.  The baby was taken to the neonatal intensive care unit for respiratory distress while the patient received fairly routine postoperative care.  She continued IV antibiotics x2 doses status post delivery and remained afebrile after the delivery.   She remained on magnesium for two days postpartum at which point she had improvement of her blood pressures of 100s-114s/50s-60s as well as good urine output.  She was breast and bottle feeding off antibiotics, off magnesium and doing well and deemed ready for discharge on October 27.  She used Depo for birth control prior to discharge and she will return to MAU in two days for staple removal.  CONDITION ON DISCHARGE:       Stable. Dictated by:   Andrey Spearman, M.D. Attending Physician:  Nelia Shi DD:  02/19/02 TD:  02/20/02 Job: [id number]

## 2011-04-30 NOTE — Discharge Summary (Signed)
[facility]  Patient:    [name] [name], [name] [name] Visit Number: [id number] MRN: [id number]          Service Type: GYN Location: [facility] Attending Physician:  Tammi Sou Dictated by:   Marlinda Mike, CNM Proc. Date: 09/30/01 Admit Date:  11/26/2001 Discharge Date: 11/26/2001                             Discharge Summary  ADMISSION DIAGNOSES:          1. Thirty-nine weeks gestation.                               2. Preeclampsia.  HOSPITAL COURSE:              The patient was to collect a 24-hour urine, receive serial blood pressures, to remain on bed rest with continuous fetal monitoring and to have PIH labs completed.  LABORATORY DATA:              On September 30, 2001, CBC: Hemoglobin was 11.5, hematocrit 32.9, platelet count was 162,000. Comprehensive metabolic panel results included a creatinine of 0.8, AST of 33, ALT of 15, LDH of 226, and a uric acid of 6.8.  Blood pressures during hospitalization were elevated: 148/105, 146/83, 140/90. The patient was noncompliant with care. She refused to remain on bed rest. The patient was AMA from the hospital on October 01, 2001.  DISCHARGE DIAGNOSES:          1. Thirty-nine weeks gestation.                               2. Preeclampsia.                               3. Noncompliance.  SIGNIFICANT OBSTETRICAL HISTORY:                      [facility]. She was a gravida 1, para 0 with EDC of October 07, 2001. She was followed in [facility] related to adolescence, sickle cell trait, psychiatric disorder, and preeclampsia. Dictated by:   Marlinda Mike, CNM Attending Physician:  Tammi Sou DD:  12/20/01 TD:  12/21/01 Job: [id number]

## 2011-04-30 NOTE — Op Note (Signed)
[name] [name], [name] [name]               ACCOUNT NO.:  [id number]   MEDICAL RECORD NO.:  [id number]          PATIENT TYPE:  INP   LOCATION:  9120                          [facility]   PHYSICIAN:  Tanya S. Shawnie Pons, M.D.   DATE OF BIRTH:  28-Aug-1981   DATE OF PROCEDURE:  10/11/2005  DATE OF DISCHARGE:                                 OPERATIVE REPORT   PREOPERATIVE DIAGNOSES:  1.  A 39-week intrauterine pregnancy.  2.  History of two previous cesarean sections.   POSTOPERATIVE DIAGNOSES:  1.  A 39-week intrauterine pregnancy.  2.  History of two previous cesarean sections.   PROCEDURE:  Repeat low transverse cesarean section via Pfannenstiel.   SURGEON:  Shelbie Proctor. Shawnie Pons, M.D.   ASSISTANTSMichele Mcalpine D. Okey Dupre, M.D.; Marc Morgans. Mayford Knife, M.D.   ANESTHESIA:  Spinal.   COMPLICATIONS:  None.   ESTIMATED BLOOD LOSS:  600 mL.   FLUIDS:  3900 mL lactated Ringer's.   URINE OUTPUT:  200 mL clear urine.   INDICATION:  The patient is a 30 year old, G4, P2-0-1-2, at 39-4/[redacted] weeks  gestation who presented for scheduled repeat cesarean section.  The patient  had been counseled on repeat cesarean section and wanted to procedure.   FINDINGS:  Female infant in a cephalic presentation.  Apgars 7 and 9.  Significant scarring in her abdominal cavity with her uterus being adhesed  to her anterior abdominal wall.   DESCRIPTION OF PROCEDURE:  The patient was taken to the operating room where  spinal anesthesia was found to be adequate.  She was then prepped and draped  in the normal sterile fashion in the dorsal supine position with a leftward  tilt.  A Pfannenstiel skin incision was then made with the scalpel and  carried through to the underlying layer of fascia.  The fascia was nicked in  the midline and the incision extended laterally with Mayo scissors and also  using the Bovie.  The superior aspect of the fascial incision was then  grasped with Kocher clamps, elevated, and the underlying rectus  muscles  dissected off.  The Bovie was also used.  Please note that there was a  significant amount of scarring and the planes were not very well defined.   Attention was then turned to the inferior aspect of the incision which, in a  similar fashion, was grasped, tented up with Kocher clamps, and the rectus  muscles dissected off bluntly.  The rectus muscles were separated in the  midline and it was attempted to take in superiorly to avoid any bladder.  Once again, there was a significant amount of adhesions.  We were able to  get in just a little more inferior going down by layers using the Bovie.  Once in the peritoneal cavity, examination revealed that the uterus was  indeed adhesed to the anterior abdominal wall.  The peritoneum incision was  extended superiorly and inferiorly.  The bladder flap was adhesed to the  uterus.   Bladder blade was inserted and the vesicouterine peritoneum identified,  grasped with pickups, and entered with the Metzenbaum scissors.  A bladder  flap was attempted and the bladder was able to be taken down somewhat.  It  was adhesed higher on the right side.  The bladder blade was then reinserted  and the lower uterine segment incised in a transverse fashion with the  scalpel.  The uterine incision was then extended laterally with bandage  scissors.  The bladder blade was removed and the infant's head and body  delivered atraumatically.   The nose and mouth were bulb suctioned and the cord clamped and cut.  The  infant was handed off to the awaiting pediatricians.   The placenta was then removed manually.  The uterus was cleared of all clots  and debris.  The uterine incision was then repaired with 0 Vicryl in a  running locked fashion.  The area was hemostatic.   Attention was then turned to the superior area of the uterus where there was  some bleeding.  A running stitch using 2-0 chromic was done and excellent  hemostasis was obtained.   Attention  was then returned back down to the uterine incision and there was  a little bit of oozing so a second layer of 0 Vicryl was used to reinforce  it.  Finally, excellent hemostasis was obtained.  The fascia was  reapproximated with 0 Vicryl in a running fashion.  The skin was then closed  with staples.   The patient tolerated the procedure well.  Sponge, lap, and needle counts  were correct x2.  One gram of Ancef was given at cord clamp.  The patient  was taken to the recovery room in stable condition and there were no  complications.     ______________________________  Marc Morgans Mayford Knife, M.D.    ______________________________  Shelbie Proctor. Shawnie Pons, M.D.    TLW/MEDQ  D:  10/11/2005  T:  10/11/2005  Job:  [id number]

## 2011-04-30 NOTE — Discharge Summary (Signed)
[name] [name], STRENG               ACCOUNT NO.:  [id number]   MEDICAL RECORD NO.:  [id number]          PATIENT TYPE:  INP   LOCATION:  9120                          [facility]   PHYSICIAN:  Tracy L. Mayford Knife, M.D.DATE OF BIRTH:  09-27-1981   DATE OF ADMISSION:  10/11/2005  DATE OF DISCHARGE:  10/14/2005                                 DISCHARGE SUMMARY   REASON FOR ADMISSION:  Scheduled repeat cesarean section.   DISCHARGE DIAGNOSES:  Status post repeat low transverse cesarean section.   Dictation ended at this point.           ______________________________  Marc Morgans. Mayford Knife, M.D.     TLW/MEDQ  D:  10/14/2005  T:  10/14/2005  Job:  [id number]

## 2011-04-30 NOTE — Discharge Summary (Signed)
[name] [name], [name] [name]               ACCOUNT NO.:  [id number]   MEDICAL RECORD NO.:  [id number]          PATIENT TYPE:  INP   LOCATION:  9120                          [facility]   PHYSICIAN:  [name] [name], M.D.   DATE OF BIRTH:  01-Mar-1981   DATE OF ADMISSION:  10/11/2005  DATE OF DISCHARGE:  10/14/2005                                 DISCHARGE SUMMARY   REASON FOR ADMISSION:  Scheduled repeat low transverse Cesarean section.   DISCHARGE DIAGNOSES:  1.  Status post repeat low transverse Cesarean section.  2.  Anemia of pregnancy aggravated by acute blood loss.   PROCEDURE:  Repeat low transverse Cesarean section.   PERTINENT LABORATORY DATA:  Admit hemoglobin 10.9, postoperative day #1  hemoglobin 9.1.   HOSPITAL COURSE:  The patient is a 30 year old G4, P2-0-1-2 at 39-4/7 weeks  who presented for scheduled repeat low transverse Cesarean section.  She had  two prior Cesarean sections.  Initially she had late prenatal care with this  pregnancy and reported that she had broken up with the father of the baby  which she would not have done if she had known she was pregnant.  She  initially said that she wanted a tubal but then changed her mind and then  decided that she did want to have a tubal but she wanted to wait until six  week postpartum visit.  I explained to her that this would be another  surgery and that is still what she wanted to do.  Please see the operative  note for full details of the Cesarean section.  There was a significant  amount of adhesions but a viable female infant was born without complications.  Postoperative course was complicated by the patient's lower than average  intelligence.  She was prone to answering yes to any question that you would  ask.  She complained of some abdominal pain that was improved with Percocet.  She did develop a seroma but did not show any signs of infection and  remained afebrile during her hospital course.  Social work was  consulted and  was extremely helpful.  The patient is followed by Bonnetta Barry, a case  Child psychotherapist.  She has been followed closely since the birth of her first  child and they felt comfortable with her being discharged to home with the  baby.   DISPOSITION:  To home.   FOLLOW UP:  1.  She will be seen by Seward Grater who is with Siri Cole tomorrow to      evaluate the incision and seroma.  2.  She will be seen in OB/GYN Clinic in three weeks.  3.  Case worker will see her a couple of times a week along with [facility] who will see her two to three times a week.  4.  She will need to follow up at [facility] for a six week appointment.  5.  Social work is going to arrange for [employer] to have her sign the      tubal paper and then  they will arrange her appointment with Dr. Kirkland Hun for a tubal if that is still what she desires.   DISCHARGE MEDICATIONS:  1.  Percocet 5, one to two p.o. q.4-6h. PRN pain.  2.  Motrin 600 mg p.o. q.6h. PRN pain.  3.  Colace 100 mg p.o. b.i.d.  4.  Prenatal vitamins p.o. daily.   DISCHARGE INSTRUCTIONS:  The patient was counseled when to return to the MAU  including for signs of infection.   DIET:  Regular.   ACTIVITY:  As tolerated but no heavy lifting and nothing per vagina for 6  weeks.     ______________________________  [name] [name], M.D.    ______________________________  [name] [name], M.D.    TLW/MEDQ  D:  10/14/2005  T:  10/14/2005  Job:  [id number]
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
## 2011-04-30 NOTE — Discharge Summary (Signed)
NAMEARIADNA, [name]               ACCOUNT NO.:  [id number]   MEDICAL RECORD NO.:  [id number]          PATIENT TYPE:  INP   LOCATION:  9117                          [facility]   PHYSICIAN:  Rudy Jew. Ashley [name], M.D.DATE OF BIRTH:  Apr 01, 1981   DATE OF ADMISSION:  10/30/2007  DATE OF DISCHARGE:  11/05/2007                               DISCHARGE SUMMARY   DISCHARGE DIAGNOSES:  1. Intrauterine pregnancy at [redacted] weeks gestation, delivered.  2. Previous cesarean section x3.  3. Sickle trait.  4. History of noncompliance with gynecological care.  5. Adhesions - dense, from the uterus to the anterior abdominal wall.   OPERATIONS AND SPECIAL PROCEDURES:  Repeat low transverse cesarean  section.   COMPLICATIONS:  None.   DISCHARGE MEDICATIONS:  Percocet, Motrin, iron.   HISTORY AND PHYSICAL:  This is a 25-year gravida 5, para 3-0-1-3 with an  EDC of November 06, 2007, 39 weeks' gestation.  The patient had previous  cesarean sections x3.  Also, she has sickle trait.  Pregnancy also  complicated by noncompliance with obstetrical care.  She was admitted  for repeat cesarean section.  Operative notes were reviewed, and all but  the first cesarean section was complicated by severe adhesions from the  uterus to the abdominal wall.  For the remainder of the history and  physical, please see chart.   HOSPITAL COURSE:  The patient was admitted to [facility] of  [place].  Admission laboratory studies were drawn.  On October 30, 2007, she was taken to the operating room and underwent repeat low  transverse cesarean section.  The procedure was characterized by dense  adhesions from the uterus to the anterior abdominal wall requiring  extensive dissection and some difficulty with hemostasis of the uterine  wall.  The procedure yielded a 6 pound 7 ounce female, Apgars 9 at 1  minute and 9 at 5 minutes,  sent to the newborn nursery.  A Al Pimple drain was placed in the anterior  cul-de-sac to aid in  postoperative drainage of that space.  A Foley catheter was left in  place as well.   Initial postoperative course was characterized by substantial Al Pimple output.  Over time, however, this output diminished.  The patient  was noted to have a hemoglobin on November 01, 2007 of 8.0.  At that  point, it was asymptomatic.  On November 11, 2007, hemoglobin was 6.9.  The patient was complaining of slight dizziness with ambulating.  It was  advised that she accept 2 units of packed red blood cells.  However,  after discussion of the risks and benefits, the patient refused the  transfusion.  She was hence maintained in the hospital on iron therapy  and careful observation.  Later on November 02, 2007, the hemoglobin was  noted to be 8.0.  The Jackson-Pratt drain was removed.  On November 03, 2007, the hemoglobin was noted to be 6.7.  The patient continued to  refuse transfusion, and supportive care was rendered.  On November 04, 2007, the hemoglobin was noted to be 7.5.  As the patient still voiced  slight dizziness on ambulating, she was maintained in the hospital for  supportive care.  By November 05, 2007, the patient admitted her  dizziness had resolved.  Pulse was 84 and vital signs stable.  She was  felt to be a candidate for discharge at this point and was discharged  home afebrile and in satisfactory condition.   DISPOSITION:  The patient is return to [facility] on approximately November 24, 2007 for postpartum evaluation.      [name] [name], M.D.  Electronically Signed     JAM/MEDQ  D:  11/22/2007  T:  11/23/2007  Job:  [id number]

## 2011-04-30 NOTE — Discharge Summary (Signed)
NAME:  [name] [name], [name] [name]                         ACCOUNT NO.:  [id number]   MEDICAL RECORD NO.:  [id number]                   PATIENT TYPE:  INP   LOCATION:  9111                                 [facility]   PHYSICIAN:  Janine Limbo, M.D.            DATE OF BIRTH:  Apr 22, 1981   DATE OF ADMISSION:  12/05/2003  DATE OF DISCHARGE:  12/08/2003                                 DISCHARGE SUMMARY   ADMISSION DIAGNOSES:  1. Term pregnancy.  2. Prior cesarean section.  3. Desires repeat cesarean section.   DISCHARGE DIAGNOSES:  1. Term pregnancy.  2. Prior cesarean section.  3. Desires repeat cesarean section.  4. Status post repeat low transverse cesarean section of a viable female     infant named Marcelino Duster, 7 pounds 15 ounces, Apgars 7 and 8.  5. Adhesions.   HOSPITAL PROCEDURES:  1. Spinal anesthesia.  2. Repeat low transverse cesarean section.   HOSPITAL COURSE:  Patient was admitted for an elective C-section which was  performed by Dr. Stefano Gaul under spinal anesthesia with no complications.  EBL was 800 mL.  On postop day #1 she was ambulating and voiding without  difficulty, bottle-feeding her infant and breasts were soft, abdomen was  soft and appropriately tender, incision was clean, dry, and intact with JP  draining scant amount.  The JP drain was removed on postop day #2 without  difficulty.  She continued to recover and on postop day #3 her vital signs  were stable, she was afebrile, abdomen was soft and appropriately tender,  chest was clear, heart rate regular rate and rhythm, incision was clean,  dry, and intact, lochia was small, extremities within normal limits, and she  was deemed to have received full benefit of her hospital stay and was  discharged home.   DISCHARGE MEDICATIONS:  1. Motrin 600 mg p.o. q.6h. p.r.n.  2. Tylox one to two p.o. q.4h. p.r.n.  3. Prenatal vitamins.   DISCHARGE LABORATORIES:  Hemoglobin 9.8.   DISCHARGE INSTRUCTIONS:  Per [facility]  handout.   DISCHARGE FOLLOWUP:  In 6 weeks or p.r.n.     Marie L. Williams, C.N.M.                 Janine Limbo, M.D.    MLW/MEDQ  D:  12/08/2003  T:  12/08/2003  Job:  [id number]

## 2011-04-30 NOTE — Op Note (Signed)
NAME:  [name] [name], [name] [name]                         ACCOUNT NO.:  [id number]   MEDICAL RECORD NO.:  [id number]                   PATIENT TYPE:  INP   LOCATION:  9111                                 [facility]   PHYSICIAN:  Janine Limbo, M.D.            DATE OF BIRTH:  [birth year]   DATE OF PROCEDURE:  12/05/2003  DATE OF DISCHARGE:                                 OPERATIVE REPORT   PREOPERATIVE DIAGNOSES:  1. Term intrauterine pregnancy.  2. Prior cesarean section.  3. Desires repeat cesarean section.   POSTOPERATIVE DIAGNOSES:  1. Term intrauterine pregnancy.  2. Prior cesarean section.  3. Desires repeat cesarean section.  4. Severe adhesions.   PROCEDURE:  Repeat low transverse cesarean section.   SURGEON:  Janine Limbo, M.D.   FIRST ASSISTANT:  Renaldo Reel. Emilee Hero, C.N.M.   ANESTHESIA:  Spinal.   DISPOSITION:  [name] is a 30 year old female, gravida 3, para 1-0-1-1,  who presents at 39-1/2 weeks' gestation.  She has the above-mentioned  diagnoses.  She had previously considered an attempt at vaginal delivery but  now has decided to proceed with cesarean section.  She understands the  indications for her procedure and she accepts the risks of, but not limited  to, anesthetic complications, bleeding, infections, and possible damage to  the surrounding organs.   FINDINGS:  A 7 pound 15 ounce female infant Marcelino Duster) was delivered with  the assistance of a Mityvac vacuum extractor.  The Apgars were 7 at one  minute and 9 at five minutes.  There were dense adhesions between the  anterior uterus and the anterior peritoneum.  The fallopian tubes and the  ovaries appeared normal.   DESCRIPTION OF PROCEDURE:  The patient was taken to the operating room,  where a spinal anesthetic was given.  The patient's abdomen, perineum, and  vagina were prepped with multiple layers of Betadine.  The Foley catheter  was placed in the bladder.  The patient was sterilely draped.   The lower  abdomen was injected with 15 mL of 0.5% Marcaine with epinephrine.  A low  transverse incision was made and carried sharply through the subcutaneous  tissue, the fascia, and the anterior peritoneum.  Dense adhesions were  encountered between the anterior uterus and the anterior peritoneum.  A  combination of sharp and blunt dissection was required to lyse the  adhesions.  It was difficult to reach the lower uterine segment but once we  were able to reach the lower uterine segment, an incision was made and the  incision was extended transversely.  The fetal head was delivered with the  assistance of a Mityvac vacuum extractor.  The mouth and nose were  suctioned.  The infant was handed to the awaiting pediatric team.  Routine  cord blood studies were obtained.  The placenta was removed.  The uterine  cavity was cleaned of amniotic fluid, clotted blood, and membranes.  The  uterine incision was closed using a running locking suture of 2-0 Vicryl,  followed by an imbricating suture of 2-0 Vicryl.  Hemostasis was adequate.  There was some bleeding from the anterior uterus where the dense adhesions  were cut.  Figure-of-eight sutures were required for hemostasis.  Interceed  was placed over the anterior uterus to hopefully decrease the possibility of  future adhesions.  The anterior peritoneum and the abdominal musculature  were reapproximated in the midline using 2-0 Vicryl.  The abdominal  musculature and the fascia were irrigated.  Hemostasis was adequate.  The  fascia was closed using a running suture of 0 Vicryl, followed by three  interrupted sutures of 0 Vicryl.  A Jackson-Pratt drain was placed in the  subcutaneous space and brought out through the left lower quadrant.  The  Jackson-Pratt drain was sutured into place using 4-0 silk.  The subcutaneous  layer was closed using 0 Vicryl.  The skin was reapproximated using a 4-0  suture of Monocryl.  Sponge, needle, and instrument  counts were correct on  two occasions.  The estimated blood loss was 800 mL.  The patient tolerated  her procedure well.  The infant was taken to the full-term nursery in stable  condition.  The patient was taken to the recovery room in stable condition.  She was noted to drain clear yellow urine at the end of her procedure.                                               Janine Limbo, M.D.    AVS/MEDQ  D:  12/06/2003  T:  12/06/2003  Job:  [id number]

## 2011-07-28 ENCOUNTER — Emergency Department (HOSPITAL_COMMUNITY)
Admission: EM | Admit: 2011-07-28 | Discharge: 2011-07-28 | Disposition: A | Discharge status: Home / Self Care | Payer: Medicaid Other | Attending: Emergency Medicine | Admitting: Emergency Medicine

## 2011-07-28 DIAGNOSIS — Z79899 Other long term (current) drug therapy: Secondary | ICD-10-CM | POA: Insufficient documentation

## 2011-07-28 DIAGNOSIS — F411 Generalized anxiety disorder: Secondary | ICD-10-CM | POA: Insufficient documentation

## 2011-07-28 DIAGNOSIS — F319 Bipolar disorder, unspecified: Secondary | ICD-10-CM | POA: Insufficient documentation

## 2011-07-28 LAB — COMPREHENSIVE METABOLIC PANEL
Albumin: 3.8 g/dL (ref 3.5–5.2)
BUN: 10 mg/dL (ref 6–23)
Creatinine, Ser: 0.85 mg/dL (ref 0.50–1.10)
Potassium: 3.6 mEq/L (ref 3.5–5.1)
Total Protein: 8.3 g/dL (ref 6.0–8.3)

## 2011-07-28 LAB — DIFFERENTIAL
Basophils Relative: 0 % (ref 0–1)
Eosinophils Absolute: 0.2 10*3/uL (ref 0.0–0.7)
Eosinophils Relative: 2 % (ref 0–5)
Lymphs Abs: 1.9 10*3/uL (ref 0.7–4.0)

## 2011-07-28 LAB — CBC
MCH: 27.6 pg (ref 26.0–34.0)
MCV: 81.5 fL (ref 78.0–100.0)
Platelets: 302 10*3/uL (ref 150–400)
RDW: 13.5 % (ref 11.5–15.5)
WBC: 6.4 10*3/uL (ref 4.0–10.5)

## 2011-07-28 LAB — URINE MICROSCOPIC-ADD ON

## 2011-07-28 LAB — URINALYSIS, ROUTINE W REFLEX MICROSCOPIC
Protein, ur: 100 mg/dL — AB
Urobilinogen, UA: 0.2 mg/dL (ref 0.0–1.0)

## 2011-07-28 LAB — RAPID URINE DRUG SCREEN, HOSP PERFORMED
Amphetamines: NOT DETECTED
Tetrahydrocannabinol: NOT DETECTED

## 2011-07-28 LAB — ETHANOL: Alcohol, Ethyl (B): <11 mg/dL (ref 0–11)

## 2011-07-28 LAB — LIPASE, BLOOD: Lipase: 39 U/L (ref 11–59)

## 2011-09-02 LAB — CBC
HCT: 28.9 — ABNORMAL LOW
MCV: 78.4
RBC: 3.69 — ABNORMAL LOW
WBC: 5.6

## 2011-09-02 LAB — POCT PREGNANCY, URINE
Operator id: 280671
Preg Test, Ur: NEGATIVE

## 2011-09-02 LAB — WET PREP, GENITAL

## 2011-09-17 LAB — URINALYSIS, ROUTINE W REFLEX MICROSCOPIC
Glucose, UA: NEGATIVE mg/dL
Ketones, ur: NEGATIVE mg/dL
Specific Gravity, Urine: 1.014 (ref 1.005–1.030)
pH: 5.5 (ref 5.0–8.0)

## 2011-09-21 LAB — CBC
HCT: 19.5 — ABNORMAL LOW
HCT: 19.9 — ABNORMAL LOW
HCT: 21.8 — ABNORMAL LOW
HCT: 23.4 — ABNORMAL LOW
HCT: 24.9 — ABNORMAL LOW
Hemoglobin: 6.9 — CL
Hemoglobin: 7.2 — CL
Hemoglobin: 8 — ABNORMAL LOW
MCHC: 33.8
MCHC: 34
MCHC: 34
MCHC: 34.5
MCHC: 34.5
MCHC: 34.7
MCV: 84.3
MCV: 85.6
MCV: 85.7
MCV: 87.1
Platelets: 173
Platelets: 187
Platelets: 189
Platelets: 189
Platelets: 209
Platelets: 218
Platelets: 234
RBC: 4.25
RDW: 14.4
RDW: 14.6
RDW: 14.6
RDW: 14.8
RDW: 14.9
WBC: 11 — ABNORMAL HIGH
WBC: 11.7 — ABNORMAL HIGH
WBC: 11.8 — ABNORMAL HIGH
WBC: 5.7
WBC: 7.1

## 2011-09-21 LAB — DIFFERENTIAL
Basophils Absolute: 0
Basophils Absolute: 0
Basophils Relative: 0
Eosinophils Relative: 1
Lymphocytes Relative: 10 — ABNORMAL LOW
Lymphocytes Relative: 7 — ABNORMAL LOW
Lymphs Abs: 1.1
Monocytes Absolute: 1.1 — ABNORMAL HIGH
Neutro Abs: 8.8 — ABNORMAL HIGH
Neutro Abs: 9.8 — ABNORMAL HIGH

## 2011-09-21 LAB — PROTIME-INR: Prothrombin Time: 13.7

## 2011-09-21 LAB — HEMOGLOBIN AND HEMATOCRIT, BLOOD: Hemoglobin: 7.5 — CL

## 2011-09-21 LAB — TYPE AND SCREEN: ABO/RH(D): B POS

## 2011-09-21 LAB — RPR: RPR Ser Ql: NONREACTIVE

## 2011-09-27 LAB — URINALYSIS, ROUTINE W REFLEX MICROSCOPIC
Bilirubin Urine: NEGATIVE
Nitrite: NEGATIVE
Specific Gravity, Urine: 1.01
Urobilinogen, UA: 0.2
pH: 6

## 2011-09-27 LAB — GC/CHLAMYDIA PROBE AMP, GENITAL: Chlamydia, DNA Probe: NEGATIVE

## 2011-09-27 LAB — WET PREP, GENITAL

## 2011-09-27 LAB — URINE MICROSCOPIC-ADD ON

## 2011-09-28 LAB — I-STAT 8, (EC8 V) (CONVERTED LAB)
Acid-base deficit: 3 — ABNORMAL HIGH
Chloride: 105
Glucose, Bld: 86
Hemoglobin: 11.6 — ABNORMAL LOW
Potassium: 3.7
Sodium: 138
TCO2: 23

## 2011-09-28 LAB — POCT I-STAT CREATININE: Operator id: 288831

## 2011-09-28 LAB — POCT CARDIAC MARKERS: CKMB, poc: <1 — ABNORMAL LOW

## 2011-09-29 LAB — URINE MICROSCOPIC-ADD ON

## 2011-09-29 LAB — URINALYSIS, ROUTINE W REFLEX MICROSCOPIC
Bilirubin Urine: NEGATIVE
Nitrite: NEGATIVE
Specific Gravity, Urine: 1.02
pH: 6

## 2011-09-30 LAB — URINALYSIS, ROUTINE W REFLEX MICROSCOPIC
Glucose, UA: NEGATIVE
Hgb urine dipstick: NEGATIVE
Specific Gravity, Urine: 1.02
pH: 5.5

## 2011-09-30 LAB — WET PREP, GENITAL: Yeast Wet Prep HPF POC: NONE SEEN

## 2011-09-30 LAB — URINE MICROSCOPIC-ADD ON

## 2011-10-07 ENCOUNTER — Inpatient Hospital Stay (INDEPENDENT_AMBULATORY_CARE_PROVIDER_SITE_OTHER)
Admission: RE | Admit: 2011-10-07 | Discharge: 2011-10-07 | Disposition: A | Discharge status: Home / Self Care | Payer: Medicaid Other | Source: Ambulatory Visit | Attending: Family Medicine | Admitting: Family Medicine

## 2011-10-07 DIAGNOSIS — J019 Acute sinusitis, unspecified: Secondary | ICD-10-CM

## 2011-10-09 ENCOUNTER — Emergency Department (HOSPITAL_COMMUNITY)
Admission: EM | Admit: 2011-10-09 | Discharge: 2011-10-09 | Disposition: A | Discharge status: Home / Self Care | Payer: Medicaid Other | Attending: Emergency Medicine | Admitting: Emergency Medicine

## 2011-10-09 DIAGNOSIS — I10 Essential (primary) hypertension: Secondary | ICD-10-CM | POA: Insufficient documentation

## 2011-10-09 DIAGNOSIS — R51 Headache: Secondary | ICD-10-CM | POA: Insufficient documentation

## 2011-10-09 DIAGNOSIS — H53149 Visual discomfort, unspecified: Secondary | ICD-10-CM | POA: Insufficient documentation

## 2011-11-13 ENCOUNTER — Encounter: Payer: Self-pay | Admitting: Emergency Medicine

## 2011-11-13 ENCOUNTER — Emergency Department (HOSPITAL_COMMUNITY)
Admission: EM | Admit: 2011-11-13 | Discharge: 2011-11-13 | Disposition: A | Discharge status: Home / Self Care | Payer: Medicaid Other | Attending: Emergency Medicine | Admitting: Emergency Medicine

## 2011-11-13 DIAGNOSIS — H53149 Visual discomfort, unspecified: Secondary | ICD-10-CM | POA: Insufficient documentation

## 2011-11-13 DIAGNOSIS — Z79899 Other long term (current) drug therapy: Secondary | ICD-10-CM | POA: Insufficient documentation

## 2011-11-13 DIAGNOSIS — G43909 Migraine, unspecified, not intractable, without status migrainosus: Secondary | ICD-10-CM | POA: Insufficient documentation

## 2011-11-13 DIAGNOSIS — B9789 Other viral agents as the cause of diseases classified elsewhere: Secondary | ICD-10-CM | POA: Insufficient documentation

## 2011-11-13 DIAGNOSIS — I10 Essential (primary) hypertension: Secondary | ICD-10-CM | POA: Insufficient documentation

## 2011-11-13 DIAGNOSIS — H9209 Otalgia, unspecified ear: Secondary | ICD-10-CM | POA: Insufficient documentation

## 2011-11-13 DIAGNOSIS — R070 Pain in throat: Secondary | ICD-10-CM | POA: Insufficient documentation

## 2011-11-13 DIAGNOSIS — B349 Viral infection, unspecified: Secondary | ICD-10-CM

## 2011-11-13 DIAGNOSIS — R0982 Postnasal drip: Secondary | ICD-10-CM | POA: Insufficient documentation

## 2011-11-13 HISTORY — DX: Essential (primary) hypertension: I10

## 2011-11-13 HISTORY — DX: Sickle-cell trait: D57.3

## 2011-11-13 MED ORDER — KETOROLAC TROMETHAMINE 60 MG/2ML IM SOLN
60.0000 mg | Freq: Once | INTRAMUSCULAR | Status: AC
  Administered 2011-11-13: 60 mg via INTRAMUSCULAR
  Filled 2011-11-13: qty 2

## 2011-11-13 NOTE — ED Provider Notes (Signed)
History     CSN: [id number] Arrival date & time: 11/13/2011 10:54 AM   First MD Initiated Contact with Patient 11/13/11 1130      Chief Complaint  Patient presents with  . Migraine    (Consider location/radiation/quality/duration/timing/severity/associated sxs/prior treatment) Patient is a 30 y.o. female presenting with migraine. The history is provided by the patient.  Migraine This is a new problem. The current episode started 3 to 5 hours ago. The problem occurs constantly. The problem has been gradually worsening. Associated symptoms include headaches. Pertinent negatives include no chest pain, no abdominal pain and no shortness of breath. Associated symptoms comments: Right ear pain and sore throat for the last 2 days. The symptoms are aggravated by stress ( Light, sound). The symptoms are relieved by acetaminophen. She has tried acetaminophen and rest for the symptoms. The treatment provided no relief.    Past Medical History  Diagnosis Date  . Migraine   . Hypertension   . Sickle cell trait     Past Surgical History  Procedure Date  . Cesarean section     History reviewed. No pertinent family history.  History  Substance Use Topics  . Smoking status: Never Smoker   . Smokeless tobacco: Never Used  . Alcohol Use: No    OB History    Grav Para Term Preterm Abortions TAB SAB Ect Mult Living                  Review of Systems  Constitutional: Negative for fever and chills.  HENT: Positive for ear pain, sore throat and postnasal drip. Negative for congestion, rhinorrhea, neck pain, neck stiffness, dental problem, sinus pressure and ear discharge.   Respiratory: Negative for shortness of breath.   Cardiovascular: Negative for chest pain.  Gastrointestinal: Negative for abdominal pain.  Genitourinary: Negative for dysuria.  Neurological: Positive for headaches.  All other systems reviewed and are negative.    Allergies  Review of patient's allergies  indicates no known allergies.  Home Medications   Current Outpatient Rx  Name Route Sig Dispense Refill  . DIPHENHYDRAMINE-APAP (SLEEP) 25-500 MG PO TABS Oral Take 1 tablet by mouth at bedtime as needed. For headache and sleep.     . DULOXETINE HCL 30 MG PO CPEP Oral Take 30 mg by mouth daily.      Marland Kitchen LISINOPRIL 10 MG PO TABS Oral Take 10 mg by mouth daily.      Marland Kitchen RIZATRIPTAN BENZOATE 10 MG PO TBDP Oral Take 10 mg by mouth as needed. May repeat in 2 hours if needed. For migraine.       BP 131/81  Pulse 88  Temp(Src) 98.3 F (36.8 C) (Oral)  Resp 18  SpO2 100%  LMP 10/15/2011  Physical Exam  Nursing note and vitals reviewed. Constitutional: She is oriented to person, place, and time. She appears well-developed and well-nourished. She appears distressed.  HENT:  Head: Normocephalic and atraumatic.  Right Ear: Tympanic membrane and ear canal normal.  Left Ear: Tympanic membrane and ear canal normal.  Mouth/Throat: Mucous membranes are not dry. Posterior oropharyngeal erythema present.  Eyes: EOM are normal. Pupils are equal, round, and reactive to light.  Cardiovascular: Normal rate, regular rhythm, normal heart sounds and intact distal pulses.  Exam reveals no friction rub.   No murmur heard. Pulmonary/Chest: Effort normal and breath sounds normal. She has no wheezes. She has no rales.  Abdominal: Soft. Bowel sounds are normal. She exhibits no distension. There is no tenderness. There  is no rebound and no guarding.  Musculoskeletal: Normal range of motion. She exhibits no tenderness.       No edema  Lymphadenopathy:    She has no cervical adenopathy.  Neurological: She is alert and oriented to person, place, and time. No cranial nerve deficit.  Skin: Skin is warm and dry. No rash noted.  Psychiatric: She has a normal mood and affect. Her behavior is normal.    ED Course  Procedures (including critical care time)  Results for orders placed during the hospital encounter of  11/13/11  RAPID STREP SCREEN      Component Value Range   Streptococcus, Group A Screen (Direct) NEGATIVE  NEGATIVE    No results found.    No diagnosis found.    MDM   Pt with symptoms consistent with viral URI with ear pain and sore throat.  Well appearing here.  No signs of breathing difficulty  No signs of  otitis or abnormal abdominal findings. Lungs sounds are clear and denies any shortness of breath or cough.  Rapid strep sent to to pharyngeal erythema. Feel that the symptoms which have been going on for 2 days had triggered her to develop a migraine. Patient has frequent migraines. She states this feels just like her normal migraines.  Patient given IM Toradol for migraine.   12:40 PM Rapid strep negative. Will discharge home with viral URI and migraine headache.     Gwyneth Sprout, MD 11/13/11 1240

## 2011-11-13 NOTE — ED Notes (Signed)
Pt reports sore throat, right ear pain onset x 2 days. Pt also reports migraine onset 0300 this morning.

## 2011-11-17 NOTE — ED Notes (Signed)
11/30  1710 Monique Watts from [facility]. called and said she sent a release of information for patient [name] [name].  She asked if we did a pregnancy test on the pt. 1745 Fax received with a signed release form. I called and left message for Ms. Watts to call. 1810  She called back and  I said no, we did not do one.  She would have to check with the ED to see if they did one. ( no U- preg. tests done by [facility] since 2010 that I could see). Vassie Moselle 11/17/2011

## 2011-11-25 ENCOUNTER — Emergency Department (HOSPITAL_COMMUNITY)
Admission: EM | Admit: 2011-11-25 | Discharge: 2011-11-25 | Disposition: A | Discharge status: Home / Self Care | Payer: Medicaid Other | Attending: Emergency Medicine | Admitting: Emergency Medicine

## 2011-11-25 ENCOUNTER — Encounter (HOSPITAL_COMMUNITY): Payer: Self-pay | Admitting: *Deleted

## 2011-11-25 DIAGNOSIS — G43909 Migraine, unspecified, not intractable, without status migrainosus: Secondary | ICD-10-CM | POA: Insufficient documentation

## 2011-11-25 DIAGNOSIS — Z79899 Other long term (current) drug therapy: Secondary | ICD-10-CM | POA: Insufficient documentation

## 2011-11-25 DIAGNOSIS — I10 Essential (primary) hypertension: Secondary | ICD-10-CM | POA: Insufficient documentation

## 2011-11-25 DIAGNOSIS — R42 Dizziness and giddiness: Secondary | ICD-10-CM | POA: Insufficient documentation

## 2011-11-25 MED ORDER — KETOROLAC TROMETHAMINE 30 MG/ML IJ SOLN
30.0000 mg | Freq: Once | INTRAMUSCULAR | Status: AC
  Administered 2011-11-25: 30 mg via INTRAVENOUS
  Filled 2011-11-25: qty 1

## 2011-11-25 MED ORDER — METOCLOPRAMIDE HCL 5 MG/ML IJ SOLN
10.0000 mg | Freq: Once | INTRAMUSCULAR | Status: AC
  Administered 2011-11-25: 10 mg via INTRAVENOUS
  Filled 2011-11-25: qty 2

## 2011-11-25 MED ORDER — SODIUM CHLORIDE 0.9 % IV BOLUS (SEPSIS)
1000.0000 mL | Freq: Once | INTRAVENOUS | Status: AC
  Administered 2011-11-25: 1000 mL via INTRAVENOUS

## 2011-11-25 MED ORDER — MORPHINE SULFATE 4 MG/ML IJ SOLN
4.0000 mg | Freq: Once | INTRAMUSCULAR | Status: DC
  Filled 2011-11-25: qty 1

## 2011-11-25 MED ORDER — DIPHENHYDRAMINE HCL 50 MG/ML IJ SOLN
12.5000 mg | Freq: Once | INTRAMUSCULAR | Status: AC
  Administered 2011-11-25: 12.5 mg via INTRAVENOUS
  Filled 2011-11-25: qty 1

## 2011-11-25 NOTE — ED Provider Notes (Signed)
History    this is a generally healthy 30 year old female presenting to the ED with a chief complaint of migraine headache. Patient states she has a history of migraine headache and has recurrent headache. The headache today is described as sharp, throbbing, and affecting her right scalp region. Patient states light and sounds does worsen her headache. She described the headache as gradual in onset. She does have some lightheadedness. She denies fever, chills, vision changes, hearing change, sore throat, sneezing, coughing, neck pain, chest pain, shortness of breath, rash, abdominal pain. Her last headache that was similar were 3 days ago. Patient states usually Excedrin migraine medication usually helps but today it did not.  CSN: [id number] Arrival date & time: 11/25/2011  5:05 PM   First MD Initiated Contact with Patient 11/25/11 1836      Chief Complaint  Patient presents with  . Migraine    (Consider location/radiation/quality/duration/timing/severity/associated sxs/prior treatment) HPI  Past Medical History  Diagnosis Date  . Migraine   . Hypertension   . Sickle cell trait     Past Surgical History  Procedure Date  . Cesarean section     No family history on file.  History  Substance Use Topics  . Smoking status: Never Smoker   . Smokeless tobacco: Never Used  . Alcohol Use: No    OB History    Grav Para Term Preterm Abortions TAB SAB Ect Mult Living                  Review of Systems  All other systems reviewed and are negative.    Allergies  Review of patient's allergies indicates no known allergies.  Home Medications   Current Outpatient Rx  Name Route Sig Dispense Refill  . ASPIRIN-ACETAMINOPHEN-CAFFEINE 250-250-65 MG PO TABS Oral Take 4 tablets by mouth every 8 (eight) hours as needed. For migraine.     Marland Kitchen DIPHENHYDRAMINE-APAP (SLEEP) 25-500 MG PO TABS Oral Take 1 tablet by mouth at bedtime as needed. For headache and sleep.     Marland Kitchen LEVONORGEST-ETH  ESTRAD 91-DAY 0.15-0.03 &0.01 MG PO TABS Oral Take 1 tablet by mouth daily.        BP 133/91  Pulse 92  Temp(Src) 98.5 F (36.9 C) (Oral)  Resp 16  SpO2 100%  LMP 10/15/2011  Physical Exam  Constitutional: She is oriented to person, place, and time. She appears well-developed and well-nourished. No distress.  HENT:  Head: Normocephalic and atraumatic.  Right Ear: External ear normal.  Left Ear: External ear normal.  Eyes: Conjunctivae are normal.  Neck: Normal range of motion and full passive range of motion without pain. Neck supple. No Brudzinski's sign noted.  Cardiovascular: Normal rate and regular rhythm.  Exam reveals no gallop and no friction rub.   No murmur heard. Pulmonary/Chest: Effort normal. No respiratory distress. She has no wheezes.  Abdominal: Soft. Bowel sounds are normal. There is no tenderness.  Musculoskeletal: Normal range of motion.  Neurological: She is alert and oriented to person, place, and time.    ED Course  Procedures (including critical care time)  Labs Reviewed - No data to display No results found.   No diagnosis found.    MDM  Patient with recurrent headache, and no concerning signs for meningitis. No URI symptoms.  She is afebrile and her vital signs stable. Migraine cocktail given. I will continue to monitor her care.   9:27 PM sxs improves after migraine cocktail.  Pt request to be discharge.  She is  currently in NAD.       Fayrene Helper, [place] 11/25/11 2127

## 2011-11-25 NOTE — ED Notes (Signed)
Pt stated that she has been having a migraine x1 day. No nausea/vomiting or  blurred vision. However, pt states that she is having light sensitivity. Pt stated that she took Tylenol at home, but it did not help with the headache. Pt stated she has a hx of migraines, and normally takes Tylenol PM and Excedrin. But did not take this because she had her kids. Will continue to monitor pt.

## 2011-11-25 NOTE — ED Notes (Signed)
Peripheral IV removed at discharge.

## 2011-11-25 NOTE — ED Provider Notes (Signed)
Medical screening examination/treatment/procedure(s) were performed by non-physician practitioner and as supervising physician I was immediately available for consultation/collaboration.  Flint Melter, MD 11/25/11 [phone]

## 2011-11-25 NOTE — ED Notes (Signed)
Pt began having HA this morning. Associated with "lightheadedness", no vision changes.  Pt has some sensitivity to light.  No neuro deficits.  No URI with this.

## 2011-11-25 NOTE — ED Notes (Signed)
Pt stated that she does not want pain medication anymore, but instead she would like to go home and take her Tylenol PM. Dr. Anitra Lauth is aware.

## 2011-12-18 ENCOUNTER — Emergency Department (HOSPITAL_COMMUNITY)
Admission: EM | Admit: 2011-12-18 | Discharge: 2011-12-19 | Disposition: A | Discharge status: Home / Self Care | Payer: Medicaid Other | Attending: Emergency Medicine | Admitting: Emergency Medicine

## 2011-12-18 DIAGNOSIS — Z79899 Other long term (current) drug therapy: Secondary | ICD-10-CM | POA: Insufficient documentation

## 2011-12-18 DIAGNOSIS — T887XXA Unspecified adverse effect of drug or medicament, initial encounter: Secondary | ICD-10-CM

## 2011-12-18 DIAGNOSIS — R4182 Altered mental status, unspecified: Secondary | ICD-10-CM | POA: Insufficient documentation

## 2011-12-18 DIAGNOSIS — D573 Sickle-cell trait: Secondary | ICD-10-CM | POA: Insufficient documentation

## 2011-12-18 DIAGNOSIS — T426X5A Adverse effect of other antiepileptic and sedative-hypnotic drugs, initial encounter: Secondary | ICD-10-CM | POA: Insufficient documentation

## 2011-12-18 DIAGNOSIS — I10 Essential (primary) hypertension: Secondary | ICD-10-CM | POA: Insufficient documentation

## 2011-12-18 MED ORDER — AMMONIA AROMATIC IN INHA
RESPIRATORY_TRACT | Status: AC
  Administered 2011-12-18: 21:00:00
  Filled 2011-12-18: qty 30

## 2011-12-18 NOTE — ED Notes (Signed)
Left wrist 18 gauge PTA  EMS

## 2011-12-19 NOTE — ED Provider Notes (Signed)
History     CSN: [id number]  Arrival date & time 12/18/11  2029   First MD Initiated Contact with Patient 12/18/11 2035      Chief Complaint  Patient presents with  . Altered Mental Status    (Consider location/radiation/quality/duration/timing/severity/associated sxs/prior treatment) Patient is a 31 y.o. female presenting with altered mental status. The history is provided by the patient.  Altered Mental Status   the patient reports she's been struggling with sleep as of recently and was recently prescribed Ambien 5 mg at bedtime.  She reports at 5 PM today she took an Ambien which made her nauseated and according to her mom, "made her eyes flip back in her head and she started saying odd things and acting strangely".  Her mother reports she is at baseline mental status now.  The patient reports she is no longer nauseated.  She reports she is only sleeping at this time.  Nothing worsens her symptoms.  Nothing improves her symptoms.  Her symptoms have resolved  Past Medical History  Diagnosis Date  . Migraine   . Hypertension   . Sickle cell trait     Past Surgical History  Procedure Date  . Cesarean section     No family history on file.  History  Substance Use Topics  . Smoking status: Never Smoker   . Smokeless tobacco: Never Used  . Alcohol Use: No    OB History    Grav Para Term Preterm Abortions TAB SAB Ect Mult Living                  Review of Systems  Psychiatric/Behavioral: Positive for altered mental status.  All other systems reviewed and are negative.    Allergies  Review of patient's allergies indicates no known allergies.  Home Medications   Current Outpatient Rx  Name Route Sig Dispense Refill  . ASPIRIN-ACETAMINOPHEN-CAFFEINE 250-250-65 MG PO TABS Oral Take 4 tablets by mouth every 8 (eight) hours as needed. For migraine.     Marland Kitchen DIPHENHYDRAMINE-APAP (SLEEP) 25-500 MG PO TABS Oral Take 1 tablet by mouth at bedtime as needed. For headache and  sleep.     Marland Kitchen LEVONORGEST-ETH ESTRAD 91-DAY 0.15-0.03 &0.01 MG PO TABS Oral Take 1 tablet by mouth daily.      Marland Kitchen ZOLPIDEM TARTRATE 5 MG PO TABS Oral Take 5 mg by mouth at bedtime as needed. For insomnia        BP 119/69  Pulse 109  Temp(Src) 98.4 F (36.9 C) (Oral)  Resp 20  SpO2 100%  Physical Exam  Nursing note and vitals reviewed. Constitutional: She is oriented to person, place, and time. She appears well-developed and well-nourished. No distress.  HENT:  Head: Normocephalic and atraumatic.  Eyes: EOM are normal.  Neck: Normal range of motion.  Cardiovascular: Normal rate, regular rhythm and normal heart sounds.   Pulmonary/Chest: Effort normal and breath sounds normal.  Abdominal: Soft. She exhibits no distension. There is no tenderness. There is no rebound and no guarding.  Musculoskeletal: Normal range of motion.  Neurological: She is alert and oriented to person, place, and time.  Skin: Skin is warm and dry.  Psychiatric: She has a normal mood and affect. Judgment normal.    ED Course  Procedures (including critical care time)   Labs Reviewed  POCT PREGNANCY, URINE  POCT PREGNANCY, URINE   No results found.   1. Medication side effect       MDM  Discharge home as this is  likely a reaction to the patient's Ambien.  Her pulse rate when I checked her was 94.         Lyanne Co, MD 12/19/11 [phone]

## 2012-03-12 ENCOUNTER — Encounter (HOSPITAL_COMMUNITY): Payer: Self-pay

## 2012-03-12 ENCOUNTER — Emergency Department (HOSPITAL_COMMUNITY)
Admission: EM | Admit: 2012-03-12 | Discharge: 2012-03-12 | Discharge status: Against Medical Advice | Payer: Medicaid Other | Attending: Emergency Medicine | Admitting: Emergency Medicine

## 2012-03-12 ENCOUNTER — Emergency Department (HOSPITAL_COMMUNITY)
Admission: EM | Admit: 2012-03-12 | Discharge: 2012-03-12 | Disposition: A | Discharge status: Home / Self Care | Payer: Medicaid Other | Source: Home / Self Care | Attending: Family Medicine | Admitting: Family Medicine

## 2012-03-12 DIAGNOSIS — G43909 Migraine, unspecified, not intractable, without status migrainosus: Secondary | ICD-10-CM

## 2012-03-12 DIAGNOSIS — M79609 Pain in unspecified limb: Secondary | ICD-10-CM | POA: Insufficient documentation

## 2012-03-12 DIAGNOSIS — R51 Headache: Secondary | ICD-10-CM | POA: Insufficient documentation

## 2012-03-12 LAB — GLUCOSE, CAPILLARY: Glucose-Capillary: 76 mg/dL (ref 70–99)

## 2012-03-12 MED ORDER — RIZATRIPTAN BENZOATE 5 MG PO TABS: 5.0000 mg | ORAL_TABLET | ORAL | Status: DC | PRN

## 2012-03-12 MED ORDER — CYCLOBENZAPRINE HCL 10 MG PO TABS: 10.0000 mg | ORAL_TABLET | Freq: Three times a day (TID) | ORAL | Status: AC | PRN

## 2012-03-12 MED ORDER — IBUPROFEN 600 MG PO TABS: 600.0000 mg | ORAL_TABLET | Freq: Three times a day (TID) | ORAL | Status: AC

## 2012-03-12 NOTE — ED Notes (Signed)
Pt called in triage and main ED waiting areas twice by Mellody Dance, EMT and Verlon Au, RN with no response

## 2012-03-12 NOTE — ED Notes (Signed)
Pt. Reports that she is having lt. Leg pain and a headache,  She thinks it started this morning.
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
## 2012-03-12 NOTE — Discharge Instructions (Signed)
Your neuro exam is normal here today. My impression is that your headache is caused by your migraines. Take the prescribed medications as instructed. Followup with your primary care provider or with a neurology specialist for recurrent headaches. Or go to the emergency department if new symptoms like loss of consciousness difficulty moving arms or legs, difficult with balance or gait, visual changes, seizures, not keeping fluids down or worsening headache that does not improve with your prescribed medications.
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
## 2012-03-15 ENCOUNTER — Encounter (HOSPITAL_COMMUNITY): Payer: Self-pay | Admitting: Emergency Medicine

## 2012-03-15 ENCOUNTER — Emergency Department (HOSPITAL_COMMUNITY)
Admission: EM | Admit: 2012-03-15 | Discharge: 2012-03-15 | Disposition: A | Discharge status: Home / Self Care | Payer: Medicaid Other | Attending: Emergency Medicine | Admitting: Emergency Medicine

## 2012-03-15 ENCOUNTER — Emergency Department (HOSPITAL_COMMUNITY): Payer: Medicaid Other

## 2012-03-15 DIAGNOSIS — S139XXA Sprain of joints and ligaments of unspecified parts of neck, initial encounter: Secondary | ICD-10-CM | POA: Insufficient documentation

## 2012-03-15 DIAGNOSIS — I10 Essential (primary) hypertension: Secondary | ICD-10-CM | POA: Insufficient documentation

## 2012-03-15 DIAGNOSIS — S161XXA Strain of muscle, fascia and tendon at neck level, initial encounter: Secondary | ICD-10-CM

## 2012-03-15 DIAGNOSIS — Z79899 Other long term (current) drug therapy: Secondary | ICD-10-CM | POA: Insufficient documentation

## 2012-03-15 DIAGNOSIS — M542 Cervicalgia: Secondary | ICD-10-CM | POA: Insufficient documentation

## 2012-03-15 DIAGNOSIS — R51 Headache: Secondary | ICD-10-CM | POA: Insufficient documentation

## 2012-03-15 MED ORDER — HYDROCODONE-ACETAMINOPHEN 5-325 MG PO TABS: 1.0000 | ORAL_TABLET | ORAL | Status: DC | PRN
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
## 2012-03-15 NOTE — Discharge Instructions (Signed)
X-ray of head and neck were normal. Will be sore for several days. Medication for pain. Alternate heat and ice

## 2012-03-15 NOTE — ED Notes (Signed)
Patient being transported to CT
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
## 2012-03-15 NOTE — ED Notes (Signed)
Patient ask for her mother Gillian Scarce [phone])  And brother (Commie Roney Marion [phone]) to be notified that the patient is in the [facility]. Both were called and brief message was left per patient request.
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
## 2012-03-15 NOTE — ED Notes (Signed)
Patient resting with NAD at this tijme.

## 2012-03-24 ENCOUNTER — Encounter (HOSPITAL_COMMUNITY): Payer: Self-pay | Admitting: *Deleted

## 2012-03-24 ENCOUNTER — Emergency Department (HOSPITAL_COMMUNITY)
Admission: EM | Admit: 2012-03-24 | Discharge: 2012-03-24 | Disposition: A | Discharge status: Home / Self Care | Payer: Medicaid Other | Attending: Emergency Medicine | Admitting: Emergency Medicine

## 2012-03-24 ENCOUNTER — Emergency Department (HOSPITAL_COMMUNITY)
Admission: EM | Admit: 2012-03-24 | Discharge: 2012-03-24 | Disposition: A | Discharge status: Other Acute Inpatient Hospital | Payer: Medicaid Other | Source: Home / Self Care

## 2012-03-24 MED ORDER — METHYLPREDNISOLONE SODIUM SUCC 125 MG IJ SOLR: 125.0000 mg | Freq: Once | INTRAMUSCULAR | Status: DC

## 2012-03-24 MED ORDER — SODIUM CHLORIDE 0.9 % IV BOLUS (SEPSIS): 1000.0000 mL | Freq: Once | INTRAVENOUS | Status: DC

## 2012-03-24 MED ORDER — MAGNESIUM SULFATE 40 MG/ML IJ SOLN
2.0000 g | INTRAMUSCULAR | Status: DC
  Filled 2012-03-24: qty 50

## 2012-03-24 NOTE — ED Notes (Signed)
Patient not in stretcher, called for in waiting room, no reply

## 2012-03-24 NOTE — ED Notes (Signed)
Patient not in room.  Patient not in triage.

## 2012-03-24 NOTE — ED Notes (Signed)
Patient reports she has a real bad headache since last night.  She reports she does have a hx of migraines

## 2012-05-06 ENCOUNTER — Emergency Department (HOSPITAL_COMMUNITY)
Admission: EM | Admit: 2012-05-06 | Discharge: 2012-05-06 | Disposition: A | Discharge status: Home Health | Payer: Medicaid Other | Attending: Emergency Medicine | Admitting: Emergency Medicine

## 2012-05-06 ENCOUNTER — Encounter (HOSPITAL_COMMUNITY): Payer: Self-pay

## 2012-05-06 DIAGNOSIS — N898 Other specified noninflammatory disorders of vagina: Secondary | ICD-10-CM | POA: Insufficient documentation

## 2012-05-06 DIAGNOSIS — Z711 Person with feared health complaint in whom no diagnosis is made: Secondary | ICD-10-CM

## 2012-05-06 DIAGNOSIS — D573 Sickle-cell trait: Secondary | ICD-10-CM | POA: Insufficient documentation

## 2012-05-06 DIAGNOSIS — I10 Essential (primary) hypertension: Secondary | ICD-10-CM | POA: Insufficient documentation

## 2012-05-06 NOTE — Discharge Instructions (Signed)
Return here as needed. °

## 2012-05-06 NOTE — ED Provider Notes (Signed)
Medical screening examination/treatment/procedure(s) were performed by non-physician practitioner and as supervising physician I was immediately available for consultation/collaboration.   Lyanne Co, MD 05/06/12 2029

## 2012-05-06 NOTE — ED Notes (Signed)
Vaginal bleeding for 3 weeks and also developed headache, dizziness and memory loss 3 days ago.

## 2012-05-06 NOTE — ED Provider Notes (Signed)
History     CSN: [id number]  Arrival date & time 05/06/12  1209   First MD Initiated Contact with Patient 05/06/12 1255      Chief Complaint  Patient presents with  . Vaginal Bleeding    headache, dizziness and memory loss , Hx of migraines    (Consider location/radiation/quality/duration/timing/severity/associated sxs/prior treatment) HPI I walk-in to the patient's room to evaluate her and she states that she has no complaints and does not want to be treated or seen for anything.  I asked her if she was having vaginal bleeding or headaches and she states to me no.  I asked her if there is a reason why she came to the hospital and she is does not feel comfortable talking about it and she says no.  Patient states that she does not want to be seen for anything. Past Medical History  Diagnosis Date  . Migraine   . Hypertension   . Sickle cell trait     Past Surgical History  Procedure Date  . Cesarean section     No family history on file.  History  Substance Use Topics  . Smoking status: Never Smoker   . Smokeless tobacco: Never Used  . Alcohol Use: Yes    OB History    Grav Para Term Preterm Abortions TAB SAB Ect Mult Living                  Review of Systems None done Allergies  Fish allergy and Penicillins  Home Medications   Current Outpatient Rx  Name Route Sig Dispense Refill  . DROSPIRENONE-ETHINYL ESTRADIOL 3-0.02 MG PO TABS Oral Take 1 tablet by mouth daily.    Marland Kitchen RIZATRIPTAN BENZOATE 10 MG PO TBDP Oral Take 10 mg by mouth 2 (two) times daily as needed. May repeat in 2 hours if needed. For migraine      BP 118/75  Pulse 92  Temp(Src) 98.7 F (37.1 C) (Oral)  Resp 18  SpO2 100%  LMP 04/08/2012  Physical Exam No PE as patient does not want to be evaluated ED Course  Procedures (including critical care time)   I asked the patient if she is sure she does not want to be treated or seen for any complaint.  She again reaffirms that she is fine  and does not want to be seen or treated.  She is refused lab test and evaluation  MDM          Carlyle Dolly, PA-C 05/06/12 1406

## 2012-05-06 NOTE — ED Notes (Signed)
Pt decide not to receive treatment for c/o voiced in triage.  Informed pt to return as needed.

## 2012-09-24 ENCOUNTER — Emergency Department (HOSPITAL_COMMUNITY)
Admission: EM | Admit: 2012-09-24 | Discharge: 2012-09-24 | Disposition: A | Discharge status: Home / Self Care | Payer: Medicaid Other | Attending: Emergency Medicine | Admitting: Emergency Medicine

## 2012-09-24 ENCOUNTER — Encounter (HOSPITAL_COMMUNITY): Payer: Self-pay | Admitting: Emergency Medicine

## 2012-09-24 DIAGNOSIS — I10 Essential (primary) hypertension: Secondary | ICD-10-CM | POA: Insufficient documentation

## 2012-09-24 DIAGNOSIS — R51 Headache: Secondary | ICD-10-CM | POA: Insufficient documentation

## 2012-09-24 DIAGNOSIS — Z880 Allergy status to penicillin: Secondary | ICD-10-CM | POA: Insufficient documentation

## 2012-09-24 DIAGNOSIS — D573 Sickle-cell trait: Secondary | ICD-10-CM | POA: Insufficient documentation

## 2012-09-24 DIAGNOSIS — Z91018 Allergy to other foods: Secondary | ICD-10-CM | POA: Insufficient documentation

## 2012-09-24 MED ORDER — DIPHENHYDRAMINE HCL 25 MG PO CAPS
25.0000 mg | ORAL_CAPSULE | Freq: Once | ORAL | Status: AC
  Administered 2012-09-24: 25 mg via ORAL
  Filled 2012-09-24: qty 1

## 2012-09-24 MED ORDER — METOCLOPRAMIDE HCL 5 MG/ML IJ SOLN: 10.0000 mg | Freq: Once | INTRAMUSCULAR | Status: DC

## 2012-09-24 MED ORDER — HYDROCODONE-ACETAMINOPHEN 5-325 MG PO TABS
1.0000 | ORAL_TABLET | Freq: Once | ORAL | Status: AC
  Administered 2012-09-24: 1 via ORAL
  Filled 2012-09-24: qty 1

## 2012-09-24 MED ORDER — SODIUM CHLORIDE 0.9 % IV BOLUS (SEPSIS): 1000.0000 mL | Freq: Once | INTRAVENOUS | Status: DC

## 2012-09-24 MED ORDER — DIPHENHYDRAMINE HCL 50 MG/ML IJ SOLN: 12.5000 mg | Freq: Once | INTRAMUSCULAR | Status: DC

## 2012-09-24 MED ORDER — METOCLOPRAMIDE HCL 10 MG PO TABS
10.0000 mg | ORAL_TABLET | Freq: Once | ORAL | Status: AC
  Administered 2012-09-24: 10 mg via ORAL
  Filled 2012-09-24: qty 1

## 2012-09-24 NOTE — ED Notes (Signed)
C/o headache and dizziness since 5am.  Reports bilateral ear pain x 3 weeks. Reports history of migraines.

## 2012-09-24 NOTE — ED Provider Notes (Signed)
History     CSN: [id number]  Arrival date & time 09/24/12  1607   First MD Initiated Contact with Patient 09/24/12 1627      Chief Complaint  Patient presents with  . Headache     HPI     31 year old female history of migraines presents with headache similar to prior migraine flareups. Patient states she has some photophobia, sonophobia but denies nausea and vomiting. Patient has not tried any OTC analgesics for her headache. She denies any trauma, fevers, neck stiffness. She further denies any numbness tingling or weakness of the extremities. Headache is global and has not noted any alleviating factors.  Past Medical History  Diagnosis Date  . Migraine   . Hypertension   . Sickle cell trait     Past Surgical History  Procedure Date  . Cesarean section     No family history on file.  History  Substance Use Topics  . Smoking status: Never Smoker   . Smokeless tobacco: Never Used  . Alcohol Use: Yes    OB History    Grav Para Term Preterm Abortions TAB SAB Ect Mult Living                  Review of Systems  Constitutional: Negative for fever, chills, activity change and appetite change.  HENT: Negative for ear pain, congestion, rhinorrhea and neck pain.   Eyes: Negative for pain.  Respiratory: Negative for cough and shortness of breath.   Cardiovascular: Negative for chest pain and palpitations.  Gastrointestinal: Negative for nausea, vomiting and abdominal pain.  Genitourinary: Negative for dysuria, difficulty urinating and pelvic pain.  Musculoskeletal: Negative for back pain.  Skin: Negative for rash and wound.  Neurological: Positive for headaches. Negative for weakness.  Psychiatric/Behavioral: Negative for behavioral problems, confusion and agitation.    Allergies  Fish allergy and Penicillins  Home Medications   Current Outpatient Rx  Name Route Sig Dispense Refill  . DROSPIRENONE-ETHINYL ESTRADIOL 3-0.02 MG PO TABS Oral Take 1 tablet by mouth  daily.    Marland Kitchen RIZATRIPTAN BENZOATE 10 MG PO TBDP Oral Take 10 mg by mouth 2 (two) times daily as needed. May repeat in 2 hours if needed. For migraine      BP 128/74  Pulse 102  Temp 98.5 F (36.9 C) (Oral)  Resp 16  SpO2 100%  LMP 08/09/2012  Physical Exam  Constitutional: She is oriented to person, place, and time. She appears well-developed and well-nourished. No distress.  HENT:  Head: Normocephalic and atraumatic.  Nose: Nose normal.  Mouth/Throat: Oropharynx is clear and moist.  Eyes: EOM are normal. Pupils are equal, round, and reactive to light.  Neck: Normal range of motion. Neck supple. No tracheal deviation present.  Cardiovascular: Normal rate, regular rhythm, normal heart sounds and intact distal pulses.   Pulmonary/Chest: Effort normal and breath sounds normal. She has no rales.  Abdominal: Soft. Bowel sounds are normal. She exhibits no distension. There is no tenderness. There is no rebound and no guarding.  Musculoskeletal: Normal range of motion. She exhibits no tenderness.  Neurological: She is alert and oriented to person, place, and time. She has normal strength. No cranial nerve deficit or sensory deficit. She displays a negative Romberg sign. GCS eye subscore is 4. GCS verbal subscore is 5. GCS motor subscore is 6.  Reflex Scores:      Patellar reflexes are 2+ on the right side and 2+ on the left side.      Achilles  reflexes are 2+ on the right side and 2+ on the left side. Skin: Skin is warm and dry. No rash noted.  Psychiatric: She has a normal mood and affect. Her behavior is normal.    ED Course  Procedures    1. Head ache       MDM    31 year old female in no acute distress, afebrile, vital signs stable, non toxic appearing who presents with headache. Headache described as similar to prior migraine headaches. Offered migraine cocktail, patient declined. Gave by mouth Reglan, Benadryl, and Norco. Neuro exam nonfocal. Funduscopic exam normal. After  administration of medications waited approximately one hour to recheck patient. Patient was no longer in her room. Patient reportedly eloped prior to completion of care.         Nadara Mustard, MD 09/25/12 [phone]

## 2012-09-24 NOTE — ED Notes (Signed)
In to see patient to start IV reporting does not want IV or IV meds.

## 2012-09-24 NOTE — ED Notes (Signed)
Pt not in room x 30 minutes.  Cannot find pt in waiting.

## 2012-09-24 NOTE — ED Notes (Signed)
Pt states that she does not want an IV unless that is the only option. MD notified.

## 2012-09-25 NOTE — ED Provider Notes (Signed)
I was present consultation and the patient in question during their ED stay and agree with the resident's documentation and resident's medical plan. Patient did elope prior to completion of medical therapy or prior to my personal examination.  Jones Skene, MD   Jones Skene, MD 09/25/12 3086

## 2012-10-10 ENCOUNTER — Emergency Department (HOSPITAL_COMMUNITY): Payer: Medicaid Other

## 2012-10-10 ENCOUNTER — Emergency Department (HOSPITAL_COMMUNITY)
Admission: EM | Admit: 2012-10-10 | Discharge: 2012-10-10 | Disposition: A | Discharge status: Home / Self Care | Payer: Medicaid Other | Attending: Emergency Medicine | Admitting: Emergency Medicine

## 2012-10-10 DIAGNOSIS — Z79899 Other long term (current) drug therapy: Secondary | ICD-10-CM | POA: Insufficient documentation

## 2012-10-10 DIAGNOSIS — S134XXA Sprain of ligaments of cervical spine, initial encounter: Secondary | ICD-10-CM

## 2012-10-10 DIAGNOSIS — I10 Essential (primary) hypertension: Secondary | ICD-10-CM | POA: Insufficient documentation

## 2012-10-10 DIAGNOSIS — Y9389 Activity, other specified: Secondary | ICD-10-CM | POA: Insufficient documentation

## 2012-10-10 DIAGNOSIS — D573 Sickle-cell trait: Secondary | ICD-10-CM | POA: Insufficient documentation

## 2012-10-10 DIAGNOSIS — S0993XA Unspecified injury of face, initial encounter: Secondary | ICD-10-CM | POA: Insufficient documentation

## 2012-10-10 DIAGNOSIS — S199XXA Unspecified injury of neck, initial encounter: Secondary | ICD-10-CM | POA: Insufficient documentation

## 2012-10-10 DIAGNOSIS — G43909 Migraine, unspecified, not intractable, without status migrainosus: Secondary | ICD-10-CM | POA: Insufficient documentation

## 2012-10-10 MED ORDER — OXYCODONE-ACETAMINOPHEN 5-325 MG PO TABS
2.0000 | ORAL_TABLET | Freq: Once | ORAL | Status: AC
  Administered 2012-10-10: 2 via ORAL
  Filled 2012-10-10: qty 2

## 2012-10-10 MED ORDER — CYCLOBENZAPRINE HCL 10 MG PO TABS: 10.0000 mg | ORAL_TABLET | Freq: Two times a day (BID) | ORAL | Status: DC | PRN

## 2012-10-10 MED ORDER — IBUPROFEN 800 MG PO TABS: 800.0000 mg | ORAL_TABLET | Freq: Three times a day (TID) | ORAL | Status: DC

## 2012-10-10 MED ORDER — CYCLOBENZAPRINE HCL 10 MG PO TABS
10.0000 mg | ORAL_TABLET | Freq: Once | ORAL | Status: AC
  Administered 2012-10-10: 10 mg via ORAL
  Filled 2012-10-10: qty 1

## 2012-10-10 MED ORDER — OXYCODONE-ACETAMINOPHEN 5-325 MG PO TABS: 1.0000 | ORAL_TABLET | Freq: Four times a day (QID) | ORAL | Status: DC | PRN

## 2012-10-10 MED ORDER — HYDROMORPHONE HCL PF 1 MG/ML IJ SOLN
1.0000 mg | Freq: Once | INTRAMUSCULAR | Status: DC
  Filled 2012-10-10: qty 1

## 2012-10-10 NOTE — ED Notes (Signed)
Pt refused Dilaudid injection, PA aware.

## 2012-10-10 NOTE — ED Notes (Signed)
Pt taken off backboard per protocol, c-collar remains on pt.
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
## 2012-10-10 NOTE — ED Notes (Signed)
Patient transported to X-ray 

## 2012-10-11 NOTE — ED Provider Notes (Signed)
Medical screening examination/treatment/procedure(s) were performed by non-physician practitioner and as supervising physician I was immediately available for consultation/collaboration.  Lorri Fukuhara, MD 10/11/12 0035 

## 2012-11-25 ENCOUNTER — Emergency Department (HOSPITAL_COMMUNITY)
Admission: EM | Admit: 2012-11-25 | Discharge: 2012-11-25 | Discharge status: Against Medical Advice | Payer: Medicaid Other | Attending: Emergency Medicine | Admitting: Emergency Medicine

## 2012-11-25 ENCOUNTER — Encounter (HOSPITAL_COMMUNITY): Payer: Self-pay | Admitting: Physical Medicine and Rehabilitation

## 2012-11-25 DIAGNOSIS — R51 Headache: Secondary | ICD-10-CM | POA: Insufficient documentation

## 2012-11-25 DIAGNOSIS — R42 Dizziness and giddiness: Secondary | ICD-10-CM | POA: Insufficient documentation

## 2012-11-25 NOTE — ED Notes (Signed)
Pt called x3 w/ no answer  

## 2012-11-25 NOTE — ED Notes (Signed)
Pt presents to department for evaluation of headache and dizziness. Onset Thursday. States she has chronic issues with migraine headaches. Denies N/V. She is conscious alert and oriented x4. 9/10 pain at the time.

## 2012-11-29 ENCOUNTER — Emergency Department (HOSPITAL_COMMUNITY)
Admission: EM | Admit: 2012-11-29 | Discharge: 2012-11-29 | Disposition: A | Discharge status: Home / Self Care | Payer: Medicaid Other | Source: Home / Self Care

## 2012-11-29 ENCOUNTER — Emergency Department (HOSPITAL_COMMUNITY)
Admission: EM | Admit: 2012-11-29 | Discharge: 2012-11-30 | Discharge status: Against Medical Advice | Payer: Medicaid Other | Attending: Emergency Medicine | Admitting: Emergency Medicine

## 2012-11-29 ENCOUNTER — Encounter (HOSPITAL_COMMUNITY): Payer: Self-pay | Admitting: Emergency Medicine

## 2012-11-29 DIAGNOSIS — Z9889 Other specified postprocedural states: Secondary | ICD-10-CM | POA: Insufficient documentation

## 2012-11-29 DIAGNOSIS — R109 Unspecified abdominal pain: Secondary | ICD-10-CM | POA: Insufficient documentation

## 2012-11-29 DIAGNOSIS — Z8679 Personal history of other diseases of the circulatory system: Secondary | ICD-10-CM | POA: Insufficient documentation

## 2012-11-29 DIAGNOSIS — I10 Essential (primary) hypertension: Secondary | ICD-10-CM | POA: Insufficient documentation

## 2012-11-29 DIAGNOSIS — O9989 Other specified diseases and conditions complicating pregnancy, childbirth and the puerperium: Secondary | ICD-10-CM | POA: Insufficient documentation

## 2012-11-29 DIAGNOSIS — D573 Sickle-cell trait: Secondary | ICD-10-CM | POA: Insufficient documentation

## 2012-11-29 DIAGNOSIS — R110 Nausea: Secondary | ICD-10-CM | POA: Insufficient documentation

## 2012-11-29 NOTE — ED Notes (Signed)
C/o R sided abd pain and nausea since yesterday.  Pt reports being [redacted] weeks pregnant and relates symptoms to prenatal vitamins that she started taking 2-3 days ago.

## 2012-11-29 NOTE — ED Notes (Signed)
Pt presented to ED with abdominal pain since yesterday.

## 2012-11-29 NOTE — ED Provider Notes (Signed)
History     CSN: [id number]  Arrival date & time 11/29/12  2133   First MD Initiated Contact with Patient 11/29/12 2325      Chief Complaint  Patient presents with  . Abdominal Pain  . Nausea    (Consider location/radiation/quality/duration/timing/severity/associated sxs/prior treatment) Patient is a 31 y.o. female presenting with abdominal pain. The history is provided by the patient.  Abdominal Pain The primary symptoms of the illness include abdominal pain and nausea. The primary symptoms of the illness do not include vomiting, dysuria, vaginal discharge or vaginal bleeding. The current episode started yesterday. The onset of the illness was gradual. The problem has not changed since onset. The abdominal pain began yesterday. The pain came on gradually. The abdominal pain has been unchanged since its onset. The abdominal pain is located in the suprapubic region. The abdominal pain does not radiate. The abdominal pain is relieved by nothing. Exacerbated by: nothing.  Nausea began more than 1 week ago. Associated with: positive pregnancy test.  The patient has not had a change in bowel habit. Significant associated medical issues do not include HIV.  Originally states LMP was October the first.    Past Medical History  Diagnosis Date  . Migraine   . Hypertension   . Sickle cell trait     Past Surgical History  Procedure Date  . Cesarean section     No family history on file.  History  Substance Use Topics  . Smoking status: Never Smoker   . Smokeless tobacco: Never Used  . Alcohol Use: Yes    OB History    Grav Para Term Preterm Abortions TAB SAB Ect Mult Living   1               Review of Systems  Gastrointestinal: Positive for nausea and abdominal pain. Negative for vomiting.  Genitourinary: Negative for dysuria, vaginal bleeding and vaginal discharge.  All other systems reviewed and are negative.    Allergies  Fish allergy and Penicillins  Home  Medications   Current Outpatient Rx  Name  Route  Sig  Dispense  Refill  . PRENATAL 27-0.8 MG PO TABS   Oral   Take 1 tablet by mouth daily.           BP 127/80  Pulse 84  Temp 98.1 F (36.7 C) (Oral)  Resp 18  SpO2 98%  LMP 09/09/2012  Physical Exam  Constitutional: She is oriented to person, place, and time. She appears well-developed and well-nourished.  HENT:  Head: Normocephalic and atraumatic.  Mouth/Throat: Oropharynx is clear and moist.  Eyes: Pupils are equal, round, and reactive to light.  Neck: Normal range of motion. Neck supple.  Cardiovascular: Normal rate and regular rhythm.   Pulmonary/Chest: Effort normal and breath sounds normal.  Abdominal: Soft. Bowel sounds are normal. There is no tenderness. There is no rebound and no guarding.  Musculoskeletal: Normal range of motion.  Neurological: She is alert and oriented to person, place, and time.  Skin: Skin is warm and dry.  Psychiatric: She has a normal mood and affect.    ED Course  Procedures (including critical care time)   Labs Reviewed  URINALYSIS, ROUTINE W REFLEX MICROSCOPIC   No results found.   No diagnosis found.    MDM  Awaiting urine for pregnancy test patient decided to leave against medical advice.  Patient has decision making capacity to refuse care.  Risks informed are but are not limited to death, perforation from  ectopic, hemoperitoneum, ongoing morbidity.  Patient verbalizes understanding.  She is welcomed to return at any time.         Jasmine Awe, MD 11/30/12 [phone]

## 2012-11-30 NOTE — ED Notes (Signed)
Pt discharged herself against  medical advice.GCS 15 and painfree.Risk explained by Dr Dewayne Shorter but pt discharged herself.

## 2012-11-30 NOTE — ED Notes (Signed)
Pt  Discharged herself against medical advice.Risk explained to her but she insisted to leave and did not want to sign the AMA form.

## 2012-12-05 ENCOUNTER — Emergency Department (HOSPITAL_COMMUNITY)
Admission: EM | Admit: 2012-12-05 | Discharge: 2012-12-05 | Disposition: A | Discharge status: Home / Self Care | Payer: Medicaid Other | Source: Home / Self Care

## 2012-12-05 ENCOUNTER — Encounter (HOSPITAL_COMMUNITY): Payer: Self-pay | Admitting: Emergency Medicine

## 2012-12-05 DIAGNOSIS — R51 Headache: Secondary | ICD-10-CM

## 2012-12-05 DIAGNOSIS — Z3490 Encounter for supervision of normal pregnancy, unspecified, unspecified trimester: Secondary | ICD-10-CM

## 2012-12-05 DIAGNOSIS — R110 Nausea: Secondary | ICD-10-CM

## 2012-12-05 MED ORDER — ACETAMINOPHEN 325 MG PO TABS
650.0000 mg | ORAL_TABLET | Freq: Once | ORAL | Status: AC
  Administered 2012-12-05: 650 mg via ORAL

## 2012-12-05 MED ORDER — ONDANSETRON 4 MG PO TBDP
ORAL_TABLET | ORAL | Status: AC
  Filled 2012-12-05: qty 1

## 2012-12-05 MED ORDER — ACETAMINOPHEN 325 MG PO TABS
ORAL_TABLET | ORAL | Status: AC
  Filled 2012-12-05: qty 2

## 2012-12-05 MED ORDER — ONDANSETRON HCL 4 MG PO TABS: 4.0000 mg | ORAL_TABLET | Freq: Four times a day (QID) | ORAL | Status: DC

## 2012-12-05 MED ORDER — ONDANSETRON 4 MG PO TBDP
4.0000 mg | ORAL_TABLET | Freq: Once | ORAL | Status: AC
  Administered 2012-12-05: 4 mg via ORAL

## 2012-12-05 NOTE — ED Notes (Signed)
Pt c/o constant headache since 02:00 this am... Sx include: nauseas, dizziness.. Denies: vomiting, diarrhea, fevers... Pt is 5 weeks preg... She is alert w/no signs of acute distress.
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
## 2012-12-05 NOTE — Discharge Instructions (Signed)
General Headache Without Cause A headache is pain or discomfort felt around the head or neck area. The specific cause of a headache may not be found. There are many causes and types of headaches. A few common ones are:  Tension headaches.  Migraine headaches.  Cluster headaches.  Chronic daily headaches. HOME CARE INSTRUCTIONS   Keep all follow-up appointments with your caregiver or any specialist referral.  Only take over-the-counter or prescription medicines for pain or discomfort as directed by your caregiver.  Lie down in a dark, quiet room when you have a headache.  Keep a headache journal to find out what may trigger your migraine headaches. For example, write down:  What you eat and drink.  How much sleep you get.  Any change to your diet or medicines.  Try massage or other relaxation techniques.  Put ice packs or heat on the head and neck. Use these 3 to 4 times per day for 15 to 20 minutes each time, or as needed.  Limit stress.  Sit up straight, and do not tense your muscles.  Quit smoking if you smoke.  Limit alcohol use.  Decrease the amount of caffeine you drink, or stop drinking caffeine.  Eat and sleep on a regular schedule.  Get 7 to 9 hours of sleep, or as recommended by your caregiver.  Keep lights dim if bright lights bother you and make your headaches worse. SEEK MEDICAL CARE IF:   You have problems with the medicines you were prescribed.  Your medicines are not working.  You have a change from the usual headache.  You have nausea or vomiting. SEEK IMMEDIATE MEDICAL CARE IF:   Your headache becomes severe.  You have a fever.  You have a stiff neck.  You have loss of vision.  You have muscular weakness or loss of muscle control.  You start losing your balance or have trouble walking.  You feel faint or pass out.  You have severe symptoms that are different from your first symptoms. MAKE SURE YOU:   Understand these  instructions.  Will watch your condition.  Will get help right away if you are not doing well or get worse. Document Released: 11/29/2005 Document Revised: 02/21/2012 Document Reviewed: 12/15/2011 [facility] Patient Information 2013 [place], [place]. Medicines During Pregnancy During pregnancy, there are medicines that are either safe or unsafe to take. Medicines include prescriptions from your caregiver, over-the-counter medicines, topical creams applied to the skin, and all herbal substances. Medicines are put into either Class A, B, C, or D. Class A and B medicines have been shown to be safe in pregnancy. Class C medicines are also considered to be safe in pregnancy, but these medicines should only be used when necessary. Class D medicines should not be used at all in pregnancy. They can be harmful to a baby.  It is best to take as little medicine as possible while pregnant. However, some medicines are necessary to take for the mother and baby's health. Sometimes, it is more dangerous to stop taking certain medicines than to stay on them. This is often the case for people with long-term (chronic) conditions such as asthma, diabetes, or high blood pressure (hypertension). If you are pregnant and have a chronic illness, call your caregiver right away. Bring a list of your medicines and their doses to your appointments. If you are planning to become pregnant, schedule a doctor's appointment and discuss your medicines with your caregiver. Lastly, write down the phone number to your pharmacist.  They can answer questions regarding a medicine's class and safety. They cannot give advice as to whether you should or should not be on a medicine.  SAFE AND UNSAFE MEDICINES There is a long list of medicines that are considered safe in pregnancy. Below is a shorter list. For specific medicines, ask your caregiver.  AllergyMedicines Loratadine, cetirizine, and chlorpheniramine are safe to take. Certain nasal steroid  sprays are safe. Talk to your caregiver about specific brands that are safe. Analgesics Acetaminophen and acetaminophen with codeine are safe to take. All other nonsteroidal anti-inflammatory drugs (NSAIDS) are not safe. This includes ibuprofen.  Antacids Many over-the-counter antacids are safe to take. Talk to your caregiver about specific brands that are safe. Famotidine, ranitidine, and lansoprazole are safe. Omepresole is considered safe to take in the second trimester. Antibiotic Medicines There are several antibiotics to avoid. These include, but are not limited to, tetracyline, quinolones, and sulfa medications. Talk to your caregiver before taking any antibiotic.  Antihistamines Talk to your caregiver about specific brands that are safe.  Asthma Medicines Most asthma steroid inhalers are safe to take. Talk to your caregiver for specific details. Calcium Calcium supplements are safe to take. Do not take oyster shell calcium.  Cough and Cold Medicines It is safe to take products with guaifenesin or dextromethorphan. Talk to your caregiver about specific brands that are safe. It is not safe to take products that contain aspirin or ibuprofen. Decongestant Medicines Pseudoephedrine-containing products are safe to take in the second and third trimester.  Depression Medicines Talk about these medicines with your caregiver.  Antidiarrheal Medicines It is safe to take loperamide. Talk to your caregiver about specific brands that are safe. It is not safe to take any antidiarrheal medicine that contains bismuth. Eyedrops Allergy eyedrops should be limited.  Iron It is safe to use certain iron-containing medicines for anemia in pregnancy. They require a prescription.  Antinausea Medicines It is safe to take doxylamine and vitamin B6 as directed. There are other prescription medicines available, if needed.  Sleep aids It is safe to take diphenhydramine and acetaminophen with  diphenhydramine.  Steroids Hydrocortisone creams are safe to use as directed. Oral steroids require a prescription. It is not safe to take any hemorrhoid cream with pramoxine or phenylephrine. Stool softener It is safe to take stool softener medicines. Avoid daily or prolonged use of stool softeners. Thyroid Medicine It is important to stay on this thyroid medicine. It needs to be followed by your caregiver.  Vaginal Medicines Your caregiver will prescribe a medicine to you if you have a vaginal infection. Certain antifungal medicines are safe to use if you have a sexually transmitted infection (STI). Talk to your caregiver.  Document Released: 11/29/2005 Document Revised: 02/21/2012 Document Reviewed: 11/30/2011 [facility] Patient Information 2013 [place], [place]. Nausea, Adult Nausea is the feeling that you have an upset stomach or have to vomit. Nausea by itself is not likely a serious concern, but it may be an early sign of more serious medical problems. As nausea gets worse, it can lead to vomiting. If vomiting develops, there is the risk of dehydration.  CAUSES   Viral infections.  Food poisoning.  Medicines.  Pregnancy.  Motion sickness.  Migraine headaches.  Emotional distress.  Severe pain from any source.  Alcohol intoxication. HOME CARE INSTRUCTIONS  Get plenty of rest.  Ask your caregiver about specific rehydration instructions.  Eat small amounts of food and sip liquids more often.  Take all medicines as told by your  caregiver. SEEK MEDICAL CARE IF:  You have not improved after 2 days, or you get worse.  You have a headache. SEEK IMMEDIATE MEDICAL CARE IF:   You have a fever.  You faint.  You keep vomiting or have blood in your vomit.  You are extremely weak or dehydrated.  You have dark or bloody stools.  You have severe chest or abdominal pain. MAKE SURE YOU:  Understand these instructions.  Will watch your condition.  Will get help  right away if you are not doing well or get worse. Document Released: 01/06/2005 Document Revised: 02/21/2012 Document Reviewed: 08/11/2011 [facility] Patient Information 2013 [place], [place].

## 2012-12-11 NOTE — ED Provider Notes (Signed)
Medical screening examination/treatment/procedure(s) were performed by resident physician or non-physician practitioner and as supervising physician I was immediately available for consultation/collaboration.   KINDL,JAMES DOUGLAS MD.    James D Kindl, MD 12/11/12 2049 

## 2012-12-19 ENCOUNTER — Emergency Department (HOSPITAL_COMMUNITY)
Admission: EM | Admit: 2012-12-19 | Discharge: 2012-12-19 | Disposition: A | Discharge status: Home / Self Care | Payer: Medicaid Other

## 2012-12-19 NOTE — ED Notes (Signed)
Pt did not answer x 1 

## 2012-12-19 NOTE — ED Notes (Signed)
Pt did not answer x 2 

## 2012-12-19 NOTE — ED Notes (Signed)
Pt did not answer x 3 

## 2013-02-21 ENCOUNTER — Encounter (HOSPITAL_COMMUNITY): Payer: Self-pay | Admitting: *Deleted

## 2013-02-21 ENCOUNTER — Emergency Department (HOSPITAL_COMMUNITY)
Admission: EM | Admit: 2013-02-21 | Discharge: 2013-02-21 | Disposition: A | Discharge status: Home / Self Care | Payer: Medicaid Other | Source: Home / Self Care | Attending: Emergency Medicine | Admitting: Emergency Medicine

## 2013-02-21 DIAGNOSIS — K047 Periapical abscess without sinus: Secondary | ICD-10-CM

## 2013-02-21 DIAGNOSIS — G43909 Migraine, unspecified, not intractable, without status migrainosus: Secondary | ICD-10-CM

## 2013-02-21 MED ORDER — DEXAMETHASONE SODIUM PHOSPHATE 10 MG/ML IJ SOLN
INTRAMUSCULAR | Status: AC
  Filled 2013-02-21: qty 1

## 2013-02-21 MED ORDER — IBUPROFEN 800 MG PO TABS: 800.0000 mg | ORAL_TABLET | Freq: Three times a day (TID) | ORAL | Status: DC

## 2013-02-21 MED ORDER — KETOROLAC TROMETHAMINE 60 MG/2ML IM SOLN
INTRAMUSCULAR | Status: AC
  Filled 2013-02-21: qty 2

## 2013-02-21 MED ORDER — DEXAMETHASONE SODIUM PHOSPHATE 10 MG/ML IJ SOLN: 10.0000 mg | Freq: Once | INTRAMUSCULAR | Status: DC

## 2013-02-21 MED ORDER — HYDROCODONE-ACETAMINOPHEN 5-325 MG PO TABS: ORAL_TABLET | ORAL | Status: DC

## 2013-02-21 MED ORDER — SUMATRIPTAN SUCCINATE 6 MG/0.5ML ~~LOC~~ SOLN
SUBCUTANEOUS | Status: AC
  Filled 2013-02-21: qty 0.5

## 2013-02-21 MED ORDER — KETOROLAC TROMETHAMINE 60 MG/2ML IM SOLN: 60.0000 mg | Freq: Once | INTRAMUSCULAR | Status: DC

## 2013-02-21 MED ORDER — AMOXICILLIN 500 MG PO CAPS: 500.0000 mg | ORAL_CAPSULE | Freq: Three times a day (TID) | ORAL | Status: DC

## 2013-02-21 MED ORDER — SUMATRIPTAN SUCCINATE 6 MG/0.5ML ~~LOC~~ SOLN
6.0000 mg | Freq: Once | SUBCUTANEOUS | Status: DC
Start: 2013-02-21 — End: 2013-02-21

## 2013-02-21 MED ORDER — SUMATRIPTAN SUCCINATE 50 MG PO TABS: 50.0000 mg | ORAL_TABLET | ORAL | Status: DC | PRN

## 2013-02-21 NOTE — ED Provider Notes (Signed)
Chief Complaint  Patient presents with  . Migraine    History of Present Illness:   [name] [name] is a 32 year old female who presents today with toothache and migraine headache. The toothache involves the right, upper, first molar. It hurts to chew on that side. She denies any injury to the tooth. There is no swelling of the gingiva, no purulent drainage, no difficulty swallowing, breathing, fever, or chills. She also has had a migraine headache since yesterday in the right side. This is rated 10 over 10. It's throbbing and pounding. She has no nausea or vomiting but does have photophobia, phonophobia, and worsening of the pain with activity. The pain is better if she lies down and a cool, dark, quiet room. She's had some blurring of her vision in both eyes. No spots or flashing lights. No diplopia. No other neurological symptoms such as paresthesias, numbness, weakness, difficulty with speech, coordination, dizziness, or ambulation. She has had migraines for about 8 years. She's tried ibuprofen and another medicine which he did not recall the name of.  Review of Systems:  Other than noted above, the patient denies any of the following symptoms: Systemic:  No fever, chills, fatigue, photophobia, stiff neck. Eye:  No redness, eye pain, discharge, blurred vision, or diplopia. ENT:  No nasal congestion, rhinorrhea, sinus pressure or pain, sneezing, earache, or sore throat.  No jaw claudication. Neuro:  No paresthesias, loss of consciousness, seizure activity, muscle weakness, trouble with coordination or gait, trouble speaking or swallowing. Psych:  No depression, anxiety or trouble sleeping.  PMFSH:  Past medical history, family history, social history, meds, and allergies were reviewed.  Physical Exam:   Vital signs:  BP 128/72  Pulse 72  Temp(Src) 98.6 F (37 C) (Oral)  SpO2 18%  LMP 09/09/2012 General:  Alert and oriented.  In no distress. Eye:  Lids and conjunctivas normal.  PERRL,   Full EOMs.  Fundi benign with normal discs and vessels. ENT:  No cranial or facial tenderness to palpation.  TMs and canals clear.  Nasal mucosa was normal and uncongested without any drainage. No intra oral lesions, pharynx clear, mucous membranes moist, dentition normal. There is pain to percussion over the right, upper, first molar. The tooth has a filling, and there is no visible decay. No swelling of the gingiva or the floor the mouth. Neck:  Supple, full ROM, no tenderness to palpation.  No adenopathy or mass. Neuro:  Alert and orented times 3.  Speech was clear, fluent, and appropriate.  Cranial nerves intact. No pronator drift, muscle strength normal. Finger to nose normal.  DTRs were 2+ and symmetrical.Station and gait were normal.  Romberg's sign was normal.  Able to perform tandem gait well. Psych:  Normal affect.  Medications given in U[facility]:  She was offered injections of Toradol, Decadron, Imitrex, however she refuses these.  Assessment:  The primary encounter diagnosis was Migraine headache. A diagnosis of Dental abscess was also pertinent to this visit.  Plan:   1.  The following meds were prescribed:   Discharge Medication List as of 02/21/2013  8:02 PM    START taking these medications   Details  amoxicillin (AMOXIL) 500 MG capsule Take 1 capsule (500 mg total) by mouth 3 (three) times daily., Starting 02/21/2013, Until Discontinued, Normal    HYDROcodone-acetaminophen (NORCO/VICODIN) 5-325 MG per tablet 1 to 2 tabs every 4 to 6 hours as needed for pain., Print    ibuprofen (ADVIL,MOTRIN) 800 MG tablet Take 1 tablet (800  mg total) by mouth 3 (three) times daily., Starting 02/21/2013, Until Discontinued, Normal    SUMAtriptan (IMITREX) 50 MG tablet Take 1 tablet (50 mg total) by mouth every 2 (two) hours as needed for migraine., Starting 02/21/2013, Until Discontinued, Normal       2.  The patient was instructed in symptomatic care and handouts were given. 3.  The patient was  told to return if becoming worse in any way, if no better in 3 or 4 days, and given some red flag symptoms that would indicate earlier return.    Reuben Likes, MD 02/21/13 2117

## 2013-02-21 NOTE — ED Notes (Signed)
Pt  Refused  All  The  Injections

## 2013-02-21 NOTE — ED Notes (Signed)
Pt  Has   History  opf  Migraines   In  Past      She  Reports  Headache  As  Well  As   A  Toothache    Which  She reports  Started  About      2  Days  Ago           denys  Any  Vomiting

## 2013-03-11 ENCOUNTER — Emergency Department (HOSPITAL_COMMUNITY)
Admission: EM | Admit: 2013-03-11 | Discharge: 2013-03-11 | Disposition: A | Discharge status: Home / Self Care | Payer: Medicaid Other

## 2013-03-11 NOTE — ED Notes (Signed)
Called for pt, no answer

## 2013-03-11 NOTE — ED Notes (Signed)
Called at triage x3 with no answer 

## 2013-07-08 ENCOUNTER — Encounter (HOSPITAL_COMMUNITY): Payer: Self-pay | Admitting: *Deleted

## 2013-07-08 ENCOUNTER — Emergency Department (HOSPITAL_COMMUNITY)
Admission: EM | Admit: 2013-07-08 | Discharge: 2013-07-08 | Disposition: A | Discharge status: Home / Self Care | Payer: Medicaid Other | Attending: Emergency Medicine | Admitting: Emergency Medicine

## 2013-07-08 DIAGNOSIS — Z3490 Encounter for supervision of normal pregnancy, unspecified, unspecified trimester: Secondary | ICD-10-CM

## 2013-07-08 DIAGNOSIS — R42 Dizziness and giddiness: Secondary | ICD-10-CM | POA: Insufficient documentation

## 2013-07-08 DIAGNOSIS — R599 Enlarged lymph nodes, unspecified: Secondary | ICD-10-CM | POA: Insufficient documentation

## 2013-07-08 DIAGNOSIS — R112 Nausea with vomiting, unspecified: Secondary | ICD-10-CM

## 2013-07-08 DIAGNOSIS — Z3201 Encounter for pregnancy test, result positive: Secondary | ICD-10-CM | POA: Insufficient documentation

## 2013-07-08 LAB — URINALYSIS, ROUTINE W REFLEX MICROSCOPIC
Bilirubin Urine: NEGATIVE
Glucose, UA: NEGATIVE mg/dL
Hgb urine dipstick: NEGATIVE
Ketones, ur: NEGATIVE mg/dL
Leukocytes, UA: NEGATIVE
Nitrite: NEGATIVE
Protein, ur: NEGATIVE mg/dL
Specific Gravity, Urine: 1.017 (ref 1.005–1.030)
Urobilinogen, UA: 0.2 mg/dL (ref 0.0–1.0)
pH: 5.5 (ref 5.0–8.0)

## 2013-07-08 LAB — PREGNANCY, URINE: Preg Test, Ur: POSITIVE — AB

## 2013-07-08 MED ORDER — ONDANSETRON HCL 4 MG PO TABS: 4.0000 mg | ORAL_TABLET | Freq: Four times a day (QID) | ORAL | Status: DC

## 2013-07-08 MED ORDER — ONDANSETRON 4 MG PO TBDP
8.0000 mg | ORAL_TABLET | Freq: Once | ORAL | Status: AC
  Administered 2013-07-08: 8 mg via ORAL
  Filled 2013-07-08: qty 2

## 2013-07-08 NOTE — ED Provider Notes (Signed)
CSN: [id number]     Arrival date & time 07/08/13  1623 History     First MD Initiated Contact with Patient 07/08/13 1714     Chief Complaint  Patient presents with  . multiple complaints    (Consider location/radiation/quality/duration/timing/severity/associated sxs/prior Treatment) Patient is a 32 y.o. female presenting with vomiting. The history is provided by the patient.  Emesis Severity:  Moderate Duration:  3 days Timing:  Intermittent Number of daily episodes:  3, worse in the morning Quality:  Stomach contents Able to tolerate:  Liquids How soon after eating does vomiting occur:  20 minutes Progression:  Worsening Chronicity:  New Recent urination:  Normal Context: not post-tussive and not self-induced   Relieved by:  Nothing Worsened by:  Nothing tried Ineffective treatments:  None tried Associated symptoms: no abdominal pain, no arthralgias, no chills, no cough, no diarrhea, no fever, no headaches, no myalgias and no sore throat   Associated symptoms comment:  Right sided posterior ear pain, associated with a "bump", it is unchanged, started 4 days ago, no change in hearing, no ear pain Risk factors: no alcohol use, no diabetes, not pregnant now ("I don't think so, I checked a pregnancy test this morning but I didn't know how to read it. ), no sick contacts, no suspect food intake and no travel to endemic areas     History reviewed. No pertinent past medical history. History reviewed. No pertinent past surgical history. No family history on file. History  Substance Use Topics  . Smoking status: Never Smoker   . Smokeless tobacco: Not on file  . Alcohol Use: No   OB History   Grav Para Term Preterm Abortions TAB SAB Ect Mult Living                 Review of Systems  Constitutional: Negative for fever, chills, diaphoresis, activity change and appetite change.  HENT: Negative for sore throat, rhinorrhea, sneezing, drooling and trouble swallowing.   Eyes:  Negative for discharge and redness.  Respiratory: Negative for cough, chest tightness, shortness of breath, wheezing and stridor.   Cardiovascular: Negative for chest pain and leg swelling.  Gastrointestinal: Positive for nausea and vomiting. Negative for abdominal pain, diarrhea, constipation and blood in stool.  Genitourinary: Negative for difficulty urinating.  Musculoskeletal: Negative for myalgias and arthralgias.  Skin: Negative for pallor.  Neurological: Positive for light-headedness. Negative for dizziness, syncope, speech difficulty, weakness and headaches.  Hematological: Negative for adenopathy. Does not bruise/bleed easily.  Psychiatric/Behavioral: Negative for confusion and agitation.    Allergies  Review of patient's allergies indicates not on file.  Home Medications   Current Outpatient Rx  Name  Route  Sig  Dispense  Refill  . diphenhydramine-acetaminophen (TYLENOL PM) 25-500 MG TABS   Oral   Take 4 tablets by mouth at bedtime as needed.         . ondansetron (ZOFRAN) 4 MG tablet   Oral   Take 1 tablet (4 mg total) by mouth every 6 (six) hours.   12 tablet   0    BP 116/66  Pulse 99  Temp(Src) 98.3 F (36.8 C) (Oral)  Resp 20  SpO2 100%  LMP 05/08/2013 Physical Exam  Constitutional: She is oriented to person, place, and time. She appears well-developed and well-nourished. No distress.  HENT:  Head: Normocephalic and atraumatic.  Right Ear: External ear normal.  Left Ear: External ear normal.  Eyes: Conjunctivae and EOM are normal. Right eye exhibits no discharge. Left  eye exhibits no discharge.  Neck: Normal range of motion. Neck supple. No JVD present.  Cardiovascular: Normal rate, regular rhythm and normal heart sounds.  Exam reveals no gallop and no friction rub.   No murmur heard. Pulmonary/Chest: Effort normal and breath sounds normal. No stridor. No respiratory distress. She has no wheezes. She has no rales. She exhibits no tenderness.   Abdominal: Soft. Bowel sounds are normal. She exhibits no distension. There is no tenderness. There is no rebound and no guarding.  Musculoskeletal: Normal range of motion. She exhibits no edema.  Lymphadenopathy:       Head (right side): Posterior auricular (tender, 1.5 cm, no overlying erythema) adenopathy present. No submental, no submandibular, no preauricular and no occipital adenopathy present.       Head (left side): No submental, no submandibular, no preauricular, no posterior auricular and no occipital adenopathy present.  Neurological: She is alert and oriented to person, place, and time.  Skin: Skin is warm. No rash noted. She is not diaphoretic.  Psychiatric: She has a normal mood and affect. Her behavior is normal.    ED Course   Procedures (including critical care time)  Labs Reviewed  PREGNANCY, URINE - Abnormal; Notable for the following:    Preg Test, Ur POSITIVE (*)    All other components within normal limits  URINALYSIS, ROUTINE W REFLEX MICROSCOPIC   No results found. 1. Pregnant   2. Nausea & vomiting     MDM  [name] 32 y.o. presents for nausea, vomiting, and a right reactive posterior auricular lymph node. Afebrile vital signs are stable. Slightly tachycardic. No acute distress. Pregnancy positive. No abdominal pain, vaginal discharge, vaginal bleeding. Likely [redacted] weeks pregnant based on last menstrual cycle. UA negative UTI. Symptoms dramatically improved with Zofran. Patient is tolerating by mouth liquids. Small reactive lymph node as noted above. No other evidence of underlying infection. She was given followup information to see an OB/GYN. She was given strong return precautions. Patient was also instructed to followup with her primary care provider if lymph node persists.  LAbs reviewed. I discussed this patient's care with my attending, Dr. Oletta Lamas.  Sena Hitch, MD 07/09/13 7829  Sena Hitch, MD 07/09/13 [phone]

## 2013-07-08 NOTE — ED Notes (Signed)
The pt is c/o nausea dizziness for  4 days with a headache.  lmp  May she does not know if she is preg

## 2013-07-08 NOTE — ED Notes (Signed)
Pt provided ginger ale.  

## 2013-07-09 ENCOUNTER — Encounter (HOSPITAL_COMMUNITY): Payer: Self-pay | Admitting: *Deleted

## 2013-07-09 NOTE — ED Provider Notes (Signed)
I saw and evaluated the patient, reviewed the resident's note and I agree with the findings and plan.  PT found to be pregnant, likely explains many if not all symptoms.  No sig distress, no fever.  Pt will need outpt OB/GYN follow up.  Antiemetics given some symptoms improvement in the ED.    Gavin Pound. Madgie Dhaliwal, MD 07/09/13 4098

## 2013-07-22 ENCOUNTER — Inpatient Hospital Stay (HOSPITAL_COMMUNITY): Payer: Medicaid Other

## 2013-07-22 ENCOUNTER — Encounter (HOSPITAL_COMMUNITY): Payer: Self-pay | Admitting: Family

## 2013-07-22 ENCOUNTER — Inpatient Hospital Stay (HOSPITAL_COMMUNITY)
Admission: AD | Admit: 2013-07-22 | Discharge: 2013-07-22 | Disposition: A | Discharge status: Home / Self Care | Payer: Medicaid Other | Source: Ambulatory Visit | Attending: Obstetrics and Gynecology | Admitting: Obstetrics and Gynecology

## 2013-07-22 DIAGNOSIS — O99891 Other specified diseases and conditions complicating pregnancy: Secondary | ICD-10-CM | POA: Insufficient documentation

## 2013-07-22 DIAGNOSIS — R51 Headache: Secondary | ICD-10-CM | POA: Insufficient documentation

## 2013-07-22 DIAGNOSIS — R109 Unspecified abdominal pain: Secondary | ICD-10-CM | POA: Insufficient documentation

## 2013-07-22 DIAGNOSIS — Z3490 Encounter for supervision of normal pregnancy, unspecified, unspecified trimester: Secondary | ICD-10-CM

## 2013-07-22 LAB — URINALYSIS, ROUTINE W REFLEX MICROSCOPIC
Glucose, UA: NEGATIVE mg/dL
Ketones, ur: NEGATIVE mg/dL
Nitrite: NEGATIVE
Protein, ur: NEGATIVE mg/dL
Urobilinogen, UA: 0.2 mg/dL (ref 0.0–1.0)

## 2013-07-22 LAB — WET PREP, GENITAL

## 2013-07-22 LAB — OB RESULTS CONSOLE GC/CHLAMYDIA: Chlamydia: NEGATIVE

## 2013-07-22 LAB — CBC
HCT: 36.8 % (ref 36.0–46.0)
MCH: 27.5 pg (ref 26.0–34.0)
MCV: 80.2 fL (ref 78.0–100.0)
Platelets: 272 10*3/uL (ref 150–400)
RDW: 13.6 % (ref 11.5–15.5)
WBC: 8.7 10*3/uL (ref 4.0–10.5)

## 2013-07-22 LAB — URINE MICROSCOPIC-ADD ON

## 2013-07-22 MED ORDER — PRENATAL COMPLETE 14-0.4 MG PO TABS: 1.0000 | ORAL_TABLET | Freq: Every day | ORAL | Status: DC

## 2013-07-22 MED ORDER — OXYCODONE-ACETAMINOPHEN 5-325 MG PO TABS
1.0000 | ORAL_TABLET | Freq: Once | ORAL | Status: AC
  Administered 2013-07-22: 1 via ORAL
  Filled 2013-07-22: qty 1

## 2013-07-22 MED ORDER — ONDANSETRON 4 MG PO TBDP
4.0000 mg | ORAL_TABLET | Freq: Once | ORAL | Status: AC
  Administered 2013-07-22: 4 mg via ORAL
  Filled 2013-07-22: qty 1

## 2013-07-22 NOTE — MAU Note (Signed)
Patient presents to MAU with c/o HA x 5 days; unrelieved by Tylenol last taken at 0700 today (1000 mg). Patient reports hx of migraine usually alleviated with Midol/ibuprofen.  Reports spotting and lower abdominal cramping after intercourse this morning. Cramping continues, spotting has subsided.

## 2013-07-22 NOTE — MAU Provider Note (Signed)
Attestation of Attending Supervision of Advanced Practitioner: Evaluation and management procedures were performed by the PA/NP/CNM/OB Fellow under my supervision/collaboration. Chart reviewed and agree with management and plan.  Tanica Gaige V 07/22/2013 9:21 PM

## 2013-07-22 NOTE — MAU Note (Signed)
Pt reports she has been having bad headaches for the past 5 days. Pt taking tylenol without relief. Pt also c.o abd cramping 2-3 days. And started  having spotting this morning (had intercourse last night).

## 2013-07-22 NOTE — MAU Provider Note (Signed)
History     CSN: [id number]  Arrival date and time: 07/22/13 1520   First Provider Initiated Contact with Patient 07/22/13 1758      Chief Complaint  Patient presents with  . Migraine  . Abdominal Pain    HPI  Ms. Adiyah Lame is a  32 y.o. female, [redacted]w[redacted]d presents to MAU with complaints of bilateral lower abdominal pain that started last night. Following intercourse this morning she noticed a scant amount of bright red bleeding when she wiped. She only noticed the bleeding once and has not noticed it further throughout the day. She also complains of a headache; history of migraine HA which is normally relieved by Ibuprofen. She took 1,000 mg of tylenol this morning with no relief. She rates her HA pain 8/10. She has an appointment to see [facility] Sept 18 for her first prenatal appointment.   OB History   Grav Para Term Preterm Abortions TAB SAB Ect Mult Living   7 5   1  1   5       Past Medical History  Diagnosis Date  . Migraine   . Hypertension   . Sickle cell trait     Past Surgical History  Procedure Laterality Date  . Cesarean section      No family history on file.  History  Substance Use Topics  . Smoking status: Never Smoker   . Smokeless tobacco: Not on file  . Alcohol Use: No    Allergies:  Allergies  Allergen Reactions  . Fish Allergy Rash    Any kind of seafood  . Penicillins Rash    Prescriptions prior to admission  Medication Sig Dispense Refill  . acetaminophen (TYLENOL) 500 MG tablet Take 1,000 mg by mouth every 6 (six) hours as needed (headache).      . ondansetron (ZOFRAN) 4 MG tablet Take 1 tablet (4 mg total) by mouth every 6 (six) hours.  12 tablet  0  . SUMAtriptan (IMITREX) 50 MG tablet Take 1 tablet (50 mg total) by mouth every 2 (two) hours as needed for migraine.  10 tablet  0   Results for orders placed during the hospital encounter of 07/22/13 (from the past 24 hour(s))  URINALYSIS, ROUTINE W REFLEX MICROSCOPIC      Status: Abnormal   Collection Time    07/22/13  4:00 PM      Result Value Range   Color, Urine YELLOW  YELLOW   APPearance CLEAR  CLEAR   Specific Gravity, Urine 1.010  1.005 - 1.030   pH 6.0  5.0 - 8.0   Glucose, UA NEGATIVE  NEGATIVE mg/dL   Hgb urine dipstick TRACE (*) NEGATIVE   Bilirubin Urine NEGATIVE  NEGATIVE   Ketones, ur NEGATIVE  NEGATIVE mg/dL   Protein, ur NEGATIVE  NEGATIVE mg/dL   Urobilinogen, UA 0.2  0.0 - 1.0 mg/dL   Nitrite NEGATIVE  NEGATIVE   Leukocytes, UA SMALL (*) NEGATIVE  URINE MICROSCOPIC-ADD ON     Status: Abnormal   Collection Time    07/22/13  4:00 PM      Result Value Range   Squamous Epithelial / LPF FEW (*) RARE   WBC, UA 0-2  <3 WBC/hpf   RBC / HPF    <3 RBC/hpf   Value: NO FORMED ELEMENTS SEEN ON URINE MICROSCOPIC EXAMINATION   Results for orders placed during the hospital encounter of 07/22/13 (from the past 24 hour(s))  URINALYSIS, ROUTINE W REFLEX MICROSCOPIC  Status: Abnormal   Collection Time    07/22/13  4:00 PM      Result Value Range   Color, Urine YELLOW  YELLOW   APPearance CLEAR  CLEAR   Specific Gravity, Urine 1.010  1.005 - 1.030   pH 6.0  5.0 - 8.0   Glucose, UA NEGATIVE  NEGATIVE mg/dL   Hgb urine dipstick TRACE (*) NEGATIVE   Bilirubin Urine NEGATIVE  NEGATIVE   Ketones, ur NEGATIVE  NEGATIVE mg/dL   Protein, ur NEGATIVE  NEGATIVE mg/dL   Urobilinogen, UA 0.2  0.0 - 1.0 mg/dL   Nitrite NEGATIVE  NEGATIVE   Leukocytes, UA SMALL (*) NEGATIVE  URINE MICROSCOPIC-ADD ON     Status: Abnormal   Collection Time    07/22/13  4:00 PM      Result Value Range   Squamous Epithelial / LPF FEW (*) RARE   WBC, UA 0-2  <3 WBC/hpf   RBC / HPF    <3 RBC/hpf   Value: NO FORMED ELEMENTS SEEN ON URINE MICROSCOPIC EXAMINATION  CBC     Status: None   Collection Time    07/22/13  6:26 PM      Result Value Range   WBC 8.7  4.0 - 10.5 K/uL   RBC 4.59  3.87 - 5.11 MIL/uL   Hemoglobin 12.6  12.0 - 15.0 g/dL   HCT 16.1  09.6 - 04.5 %    MCV 80.2  78.0 - 100.0 fL   MCH 27.5  26.0 - 34.0 pg   MCHC 34.2  30.0 - 36.0 g/dL   RDW 40.9  81.1 - 91.4 %   Platelets 272  150 - 400 K/uL  HCG, QUANTITATIVE, PREGNANCY     Status: Abnormal   Collection Time    07/22/13  6:26 PM      Result Value Range   hCG, Beta Chain, Quant, [place] [id number] (*) <5 mIU/mL  WET PREP, GENITAL     Status: Abnormal   Collection Time    07/22/13  6:45 PM      Result Value Range   Yeast Wet Prep HPF POC NONE SEEN  NONE SEEN   Trich, Wet Prep NONE SEEN  NONE SEEN   Clue Cells Wet Prep HPF POC FEW (*) NONE SEEN   WBC, Wet Prep HPF POC FEW (*) NONE SEEN   US Ob Comp Less 14 Wks  07/22/2013   *RADIOLOGY REPORT*  Clinical Data: Positive pregnancy test, vaginal bleeding, cramping. The patient refused transvaginal imaging.  OBSTETRIC <14 WK ULTRASOUND  Technique:  Transabdominal ultrasound was performed for evaluation of the gestation as well as the maternal uterus and adnexal regions.  Comparison:  None.  Intrauterine gestational sac: Visualized/normal in shape. Yolk sac: Visualized Embryo: Visualized Cardiac Activity: Visualized Heart Rate: 127 bpm  CRL:  10 mm  7 w  1 d         [place] EDC: 03/09/14  Maternal uterus/Adnexae: Ovaries are normal.  IMPRESSION: Intrauterine gestational sac, yolk sac, fetal pole, and cardiac activity.  Concordant dating by crown-rump length compared to assigned gestational age by LMP of 7 weeks 2 days.  No acute abnormality.   Original Report Authenticated By: Christiana Pellant, M.D.    Review of Systems  Constitutional: Negative for fever.  HENT:       No relief following 1 gram of tylenol   Eyes: Negative for blurred vision.  Gastrointestinal: Positive for nausea and abdominal pain. Negative for vomiting.  Bilateral, lower abdominal cramping  Genitourinary: Negative for dysuria.       No vaginal discharge. Scant vaginal bleeding. No dysuria.   Musculoskeletal: Negative for back pain.  Neurological: Positive for headaches. Negative for  dizziness.   Physical Exam   Blood pressure 128/49, pulse 99, temperature 98.5 F (36.9 C), temperature source Oral, resp. rate 18, height 5\' 4"  (1.626 m), weight 195 lb 6.4 oz (88.633 kg), last menstrual period 06/09/2013.  Physical Exam  Constitutional: She appears well-developed and well-nourished.  HENT:  Head: Normocephalic.  GI: Soft. There is tenderness. There is no rebound and no guarding.  Bilateral lower abdominal tenderness on palpation.   Genitourinary: Uterus normal. Vaginal discharge found.  Speculum exam: Vagina - Small amount of creamy discharge, no odor Cervix - No contact bleeding Bimanual exam: Cervix closed Uterus non tender, Gravid  Adnexa mild tenderness, no masses bilaterally GC/Chlam, wet prep done Chaperone present for exam.     MAU Course  Procedures None  MDM  1 percocet given 5-325 mg  4 mg Zofran sublingual tab given Headache resolved following medication   Assessment and Plan  A: Headache Nausea Viable IUP; confirmed with [place]    P:  Discharge home Start prenatal vitamins; 1 tab daily by mouth (#30) 1 refill.  Start prenatal care as soon as possible Pelvic rest until cramping/pain subsides  Ok to take tylenol over the counter for headaches as directed  Treva Huyett IRENE 07/22/2013, 5:58 PM

## 2013-07-23 LAB — GC/CHLAMYDIA PROBE AMP: GC Probe RNA: NEGATIVE

## 2013-08-06 ENCOUNTER — Inpatient Hospital Stay (HOSPITAL_COMMUNITY)
Admission: AD | Admit: 2013-08-06 | Discharge: 2013-08-06 | Discharge status: Against Medical Advice | Payer: Self-pay | Source: Ambulatory Visit | Attending: Obstetrics & Gynecology | Admitting: Obstetrics & Gynecology

## 2013-08-06 NOTE — MAU Note (Signed)
Not in lobby

## 2013-08-06 NOTE — MAU Note (Signed)
#  3 not in lobby 

## 2013-08-06 NOTE — MAU Note (Signed)
#  2 not in lobby 

## 2013-08-20 ENCOUNTER — Encounter (HOSPITAL_COMMUNITY): Payer: Self-pay | Admitting: *Deleted

## 2013-08-20 ENCOUNTER — Inpatient Hospital Stay (HOSPITAL_COMMUNITY)
Admission: AD | Admit: 2013-08-20 | Discharge: 2013-08-20 | Disposition: A | Discharge status: Home / Self Care | Payer: Medicaid Other | Source: Ambulatory Visit | Attending: Obstetrics & Gynecology | Admitting: Obstetrics & Gynecology

## 2013-08-20 ENCOUNTER — Inpatient Hospital Stay (HOSPITAL_COMMUNITY): Payer: Medicaid Other

## 2013-08-20 DIAGNOSIS — R109 Unspecified abdominal pain: Secondary | ICD-10-CM | POA: Insufficient documentation

## 2013-08-20 DIAGNOSIS — R51 Headache: Secondary | ICD-10-CM | POA: Insufficient documentation

## 2013-08-20 DIAGNOSIS — O26891 Other specified pregnancy related conditions, first trimester: Secondary | ICD-10-CM

## 2013-08-20 DIAGNOSIS — O26899 Other specified pregnancy related conditions, unspecified trimester: Secondary | ICD-10-CM

## 2013-08-20 DIAGNOSIS — O99891 Other specified diseases and conditions complicating pregnancy: Secondary | ICD-10-CM | POA: Insufficient documentation

## 2013-08-20 LAB — URINALYSIS, ROUTINE W REFLEX MICROSCOPIC
Bilirubin Urine: NEGATIVE
Ketones, ur: NEGATIVE mg/dL
Leukocytes, UA: NEGATIVE
Nitrite: NEGATIVE
Specific Gravity, Urine: 1.02 (ref 1.005–1.030)
Urobilinogen, UA: 0.2 mg/dL (ref 0.0–1.0)

## 2013-08-20 MED ORDER — METOCLOPRAMIDE HCL 10 MG PO TABS
10.0000 mg | ORAL_TABLET | ORAL | Status: AC
  Administered 2013-08-20: 10 mg via ORAL
  Filled 2013-08-20: qty 1

## 2013-08-20 MED ORDER — DEXAMETHASONE 4 MG PO TABS
4.0000 mg | ORAL_TABLET | ORAL | Status: AC
  Administered 2013-08-20: 4 mg via ORAL
  Filled 2013-08-20: qty 1

## 2013-08-20 MED ORDER — DIPHENHYDRAMINE HCL 25 MG PO CAPS
25.0000 mg | ORAL_CAPSULE | ORAL | Status: AC
  Administered 2013-08-20: 25 mg via ORAL
  Filled 2013-08-20: qty 1

## 2013-08-20 MED ORDER — DEXAMETHASONE 0.5 MG PO TABS
1.0000 mg | ORAL_TABLET | ORAL | Status: DC
  Filled 2013-08-20: qty 2

## 2013-08-20 MED ORDER — BUTALBITAL-APAP-CAFFEINE 50-325-40 MG PO TABS: 1.0000 | ORAL_TABLET | Freq: Four times a day (QID) | ORAL | Status: DC | PRN

## 2013-08-20 NOTE — MAU Provider Note (Signed)
Chief Complaint: Migraine and Abdominal Pain   None    SUBJECTIVE HPI: [name] [name] is a 32 y.o. [id number] at [redacted]w[redacted]d by LMP who presents to maternity admissions reporting headache x3 days, trauma to the left lower abdomen 2 days ago when she hit the doorknob of a door, and pain at the site of the trauma.  She denies vaginal bleeding, vaginal itching/burning, urinary symptoms, dizziness, vomiting, or fever/chills.   Past Medical History  Diagnosis Date  . Migraine   . Hypertension   . Sickle cell trait    Past Surgical History  Procedure Laterality Date  . Cesarean section     History   Social History  . Marital Status: Married    Spouse Name: N/A    Number of Children: N/A  . Years of Education: N/A   Occupational History  . Not on file.   Social History Main Topics  . Smoking status: Never Smoker   . Smokeless tobacco: Not on file  . Alcohol Use: No  . Drug Use: No  . Sexual Activity: Yes    Birth Control/ Protection: None   Other Topics Concern  . Not on file   Social History Narrative   ** Merged History Encounter **       No current facility-administered medications on file prior to encounter.   Current Outpatient Prescriptions on File Prior to Encounter  Medication Sig Dispense Refill  . acetaminophen (TYLENOL) 500 MG tablet Take 1,000 mg by mouth every 6 (six) hours as needed (headache).      . Prenatal Vit-Fe Fumarate-FA (PRENATAL COMPLETE) 14-0.4 MG TABS Take 1 tablet by mouth daily.  30 each  1   Allergies  Allergen Reactions  . Fish Allergy Rash    Any kind of seafood  . Penicillins Rash    ROS: Pertinent items in HPI  OBJECTIVE Blood pressure 129/77, pulse 92, temperature 97.2 F (36.2 C), temperature source Oral, last menstrual period 06/09/2013. GENERAL: Well-developed, well-nourished female in no acute distress.  HEENT: Normocephalic HEART: normal rate RESP: normal effort ABDOMEN: Soft, non-tender EXTREMITIES: Nontender, no edema NEURO:  Alert and oriented   LAB RESULTS Results for orders placed during the hospital encounter of 08/20/13 (from the past 24 hour(s))  URINALYSIS, ROUTINE W REFLEX MICROSCOPIC     Status: None   Collection Time    08/20/13 12:55 PM      Result Value Range   Color, Urine YELLOW  YELLOW   APPearance CLEAR  CLEAR   Specific Gravity, Urine 1.020  1.005 - 1.030   pH 6.0  5.0 - 8.0   Glucose, UA NEGATIVE  NEGATIVE mg/dL   Hgb urine dipstick NEGATIVE  NEGATIVE   Bilirubin Urine NEGATIVE  NEGATIVE   Ketones, ur NEGATIVE  NEGATIVE mg/dL   Protein, ur NEGATIVE  NEGATIVE mg/dL   Urobilinogen, UA 0.2  0.0 - 1.0 mg/dL   Nitrite NEGATIVE  NEGATIVE   Leukocytes, UA NEGATIVE  NEGATIVE     IMAGING US Ob Comp Less 14 Wks  08/20/2013   CLINICAL DATA:  Abdominal trauma.  EXAM: OBSTETRIC <14 WK ULTRASOUND  TECHNIQUE: Transabdominal ultrasound was performed for evaluation of the gestation as well as the maternal uterus and adnexal regions.  COMPARISON:  07/22/2013  FINDINGS: Intrauterine gestational sac: Visualized/normal in shape.  Yolk sac:  Absent  Embryo:  Present  Cardiac Activity: Present  Heart Rate: 140 beats per min bpm  MSD:   mm    w     d  CRL:   38.9  mm   10 w 6 d                  [place] EDC: 03/12/2014  Maternal uterus/adnexae: No subchorionic hemorrhage. Ovaries are symmetric in size and echotexture. No adnexal masses. No free fluid.  IMPRESSION: 10 week 6 day intrauterine pregnancy with a fetal heart rate 140 beats per min. No subchorionic hemorrhage.   Electronically Signed   By: Charlett Nose M.D.   On: 08/20/2013 15:06 ]  ASSESSMENT 1. Abdominal pain in pregnancy   2. Headache in pregnancy, first trimester     PLAN Fioricet x2 tabs in MAU Discharge home Prescription for Fioricet Drink plenty of water Message sent to clinic to make appointment Return to MAU as needed     Medication List         acetaminophen 500 MG tablet  Commonly known as:  TYLENOL  Take 1,000 mg by mouth every 6  (six) hours as needed (headache).     butalbital-acetaminophen-caffeine 50-325-40 MG per tablet  Commonly known as:  FIORICET  Take 1-2 tablets by mouth every 6 (six) hours as needed for headache.     PRENATAL COMPLETE 14-0.4 MG Tabs  Take 1 tablet by mouth daily.          Sharen Counter Certified Nurse-Midwife 08/20/2013  2:04 PM

## 2013-08-20 NOTE — MAU Note (Signed)
Pt presents with complaints of a headache for three days and it just keeps getting worse. States she has pain in the  lower side of her abdomen

## 2013-08-20 NOTE — MAU Note (Signed)
Not in lobby

## 2013-09-02 ENCOUNTER — Encounter (HOSPITAL_COMMUNITY): Payer: Self-pay | Admitting: *Deleted

## 2013-09-02 ENCOUNTER — Emergency Department (HOSPITAL_COMMUNITY)
Admission: EM | Admit: 2013-09-02 | Discharge: 2013-09-02 | Discharge status: Against Medical Advice | Payer: Medicaid Other | Attending: Emergency Medicine | Admitting: Emergency Medicine

## 2013-09-02 DIAGNOSIS — H9209 Otalgia, unspecified ear: Secondary | ICD-10-CM | POA: Insufficient documentation

## 2013-09-02 DIAGNOSIS — G43909 Migraine, unspecified, not intractable, without status migrainosus: Secondary | ICD-10-CM | POA: Insufficient documentation

## 2013-09-02 DIAGNOSIS — O169 Unspecified maternal hypertension, unspecified trimester: Secondary | ICD-10-CM | POA: Insufficient documentation

## 2013-09-02 DIAGNOSIS — R42 Dizziness and giddiness: Secondary | ICD-10-CM | POA: Insufficient documentation

## 2013-09-02 DIAGNOSIS — O9989 Other specified diseases and conditions complicating pregnancy, childbirth and the puerperium: Secondary | ICD-10-CM | POA: Insufficient documentation

## 2013-09-02 NOTE — ED Notes (Signed)
Pt has a bad migraine headache, left ear pain, nausea, and dizziness.  Pt has history of migraines and usually gets dizziness with some of her migraines.

## 2013-09-02 NOTE — ED Notes (Signed)
No answer X 2

## 2013-09-02 NOTE — ED Notes (Signed)
Staff has been unable to locate pt prior to 3pm.  No answer at this time.

## 2013-09-02 NOTE — ED Notes (Signed)
Pt did not answer x 1 

## 2013-09-02 NOTE — ED Notes (Signed)
Pt even has a right upper toothache that she thinks started her migraine

## 2013-09-02 NOTE — ED Notes (Signed)
Pt states she is 4 months pregnant.  

## 2013-09-25 ENCOUNTER — Encounter: Payer: Self-pay | Admitting: Family Medicine

## 2013-09-25 ENCOUNTER — Ambulatory Visit (INDEPENDENT_AMBULATORY_CARE_PROVIDER_SITE_OTHER): Payer: Medicaid Other | Admitting: Family Medicine

## 2013-09-25 VITALS — BP 110/60 | Temp 98.9°F | Wt 194.3 lb

## 2013-09-25 DIAGNOSIS — Z3481 Encounter for supervision of other normal pregnancy, first trimester: Secondary | ICD-10-CM

## 2013-09-25 DIAGNOSIS — G43909 Migraine, unspecified, not intractable, without status migrainosus: Secondary | ICD-10-CM

## 2013-09-25 DIAGNOSIS — Z23 Encounter for immunization: Secondary | ICD-10-CM

## 2013-09-25 DIAGNOSIS — Z98891 History of uterine scar from previous surgery: Secondary | ICD-10-CM

## 2013-09-25 DIAGNOSIS — Z3480 Encounter for supervision of other normal pregnancy, unspecified trimester: Secondary | ICD-10-CM | POA: Insufficient documentation

## 2013-09-25 DIAGNOSIS — Z3483 Encounter for supervision of other normal pregnancy, third trimester: Secondary | ICD-10-CM

## 2013-09-25 DIAGNOSIS — O34219 Maternal care for unspecified type scar from previous cesarean delivery: Secondary | ICD-10-CM | POA: Insufficient documentation

## 2013-09-25 LAB — POCT URINALYSIS DIP (DEVICE)
Bilirubin Urine: NEGATIVE
Hgb urine dipstick: NEGATIVE
Ketones, ur: NEGATIVE mg/dL
Protein, ur: NEGATIVE mg/dL
Urobilinogen, UA: 0.2 mg/dL (ref 0.0–1.0)
pH: 5.5 (ref 5.0–8.0)

## 2013-09-25 MED ORDER — PROMETHAZINE HCL 25 MG PO TABS: 25.0000 mg | ORAL_TABLET | Freq: Four times a day (QID) | ORAL | Status: DC | PRN

## 2013-09-25 MED ORDER — BUTALBITAL-APAP-CAFFEINE 50-325-40 MG PO TABS: 1.0000 | ORAL_TABLET | Freq: Four times a day (QID) | ORAL | Status: DC | PRN

## 2013-09-25 MED ORDER — PRENATAL COMPLETE 14-0.4 MG PO TABS: 1.0000 | ORAL_TABLET | Freq: Every day | ORAL | Status: DC

## 2013-09-25 NOTE — Progress Notes (Signed)
P= 95 Initial prenatal visit. Prenatal information given. Declined early glucose screen request to do jelly bean test.

## 2013-09-25 NOTE — Patient Instructions (Signed)
Pregnancy - Second Trimester The second trimester of pregnancy (3 to 6 months) is a period of rapid growth for you and your baby. At the end of the sixth month, your baby is about 9 inches long and weighs 1 1/2 pounds. You will begin to feel the baby move between 18 and 20 weeks of the pregnancy. This is called quickening. Weight gain is faster. A clear fluid (colostrum) may leak out of your breasts. You may feel small contractions of the womb (uterus). This is known as false labor or Braxton-Hicks contractions. This is like a practice for labor when the baby is ready to be born. Usually, the problems with morning sickness have usually passed by the end of your first trimester. Some women develop small dark blotches (called cholasma, mask of pregnancy) on their face that usually goes away after the baby is born. Exposure to the sun makes the blotches worse. Acne may also develop in some pregnant women and pregnant women who have acne, may find that it goes away. PRENATAL EXAMS  Blood work may continue to be done during prenatal exams. These tests are done to check on your health and the probable health of your baby. Blood work is used to follow your blood levels (hemoglobin). Anemia (low hemoglobin) is common during pregnancy. Iron and vitamins are given to help prevent this. You will also be checked for diabetes between 24 and 28 weeks of the pregnancy. Some of the previous blood tests may be repeated.  The size of the uterus is measured during each visit. This is to make sure that the baby is continuing to grow properly according to the dates of the pregnancy.  Your blood pressure is checked every prenatal visit. This is to make sure you are not getting toxemia.  Your urine is checked to make sure you do not have an infection, diabetes or protein in the urine.  Your weight is checked often to make sure gains are happening at the suggested rate. This is to ensure that both you and your baby are  growing normally.  Sometimes, an ultrasound is performed to confirm the proper growth and development of the baby. This is a test which bounces harmless sound waves off the baby so your caregiver can more accurately determine due dates. Sometimes, a test is done on the amniotic fluid surrounding the baby. This test is called an amniocentesis. The amniotic fluid is obtained by sticking a needle into the belly (abdomen). This is done to check the chromosomes in instances where there is a concern about possible genetic problems with the baby. It is also sometimes done near the end of pregnancy if an early delivery is required. In this case, it is done to help make sure the baby's lungs are mature enough for the baby to live outside of the womb. CHANGES OCCURING IN THE SECOND TRIMESTER OF PREGNANCY Your body goes through many changes during pregnancy. They vary from person to person. Talk to your caregiver about changes you notice that you are concerned about.  During the second trimester, you will likely have an increase in your appetite. It is normal to have cravings for certain foods. This varies from person to person and pregnancy to pregnancy.  Your lower abdomen will begin to bulge.  You may have to urinate more often because the uterus and baby are pressing on your bladder. It is also common to get more bladder infections during pregnancy. You can help this by drinking lots of fluids   and emptying your bladder before and after intercourse.  You may begin to get stretch marks on your hips, abdomen, and breasts. These are normal changes in the body during pregnancy. There are no exercises or medicines to take that prevent this change.  You may begin to develop swollen and bulging veins (varicose veins) in your legs. Wearing support hose, elevating your feet for 15 minutes, 3 to 4 times a day and limiting salt in your diet helps lessen the problem.  Heartburn may develop as the uterus grows and  pushes up against the stomach. Antacids recommended by your caregiver helps with this problem. Also, eating smaller meals 4 to 5 times a day helps.  Constipation can be treated with a stool softener or adding bulk to your diet. Drinking lots of fluids, and eating vegetables, fruits, and whole grains are helpful.  Exercising is also helpful. If you have been very active up until your pregnancy, most of these activities can be continued during your pregnancy. If you have been less active, it is helpful to start an exercise program such as walking.  Hemorrhoids may develop at the end of the second trimester. Warm sitz baths and hemorrhoid cream recommended by your caregiver helps hemorrhoid problems.  Backaches may develop during this time of your pregnancy. Avoid heavy lifting, wear low heal shoes, and practice good posture to help with backache problems.  Some pregnant women develop tingling and numbness of their hand and fingers because of swelling and tightening of ligaments in the wrist (carpel tunnel syndrome). This goes away after the baby is born.  As your breasts enlarge, you may have to get a bigger bra. Get a comfortable, cotton, support bra. Do not get a nursing bra until the last month of the pregnancy if you will be nursing the baby.  You may get a dark line from your belly button to the pubic area called the linea nigra.  You may develop rosy cheeks because of increase blood flow to the face.  You may develop spider looking lines of the face, neck, arms, and chest. These go away after the baby is born. HOME CARE INSTRUCTIONS   It is extremely important to avoid all smoking, herbs, alcohol, and unprescribed drugs during your pregnancy. These chemicals affect the formation and growth of the baby. Avoid these chemicals throughout the pregnancy to ensure the delivery of a healthy infant.  Most of your home care instructions are the same as suggested for the first trimester of your  pregnancy. Keep your caregiver's appointments. Follow your caregiver's instructions regarding medicine use, exercise, and diet.  During pregnancy, you are providing food for you and your baby. Continue to eat regular, well-balanced meals. Choose foods such as meat, fish, milk and other low fat dairy products, vegetables, fruits, and whole-grain breads and cereals. Your caregiver will tell you of the ideal weight gain.  A physical sexual relationship may be continued up until near the end of pregnancy if there are no other problems. Problems could include early (premature) leaking of amniotic fluid from the membranes, vaginal bleeding, abdominal pain, or other medical or pregnancy problems.  Exercise regularly if there are no restrictions. Check with your caregiver if you are unsure of the safety of some of your exercises. The greatest weight gain will occur in the last 2 trimesters of pregnancy. Exercise will help you:  Control your weight.  Get you in shape for labor and delivery.  Lose weight after you have the baby.  Wear   a good support or jogging bra for breast tenderness during pregnancy. This may help if worn during sleep. Pads or tissues may be used in the bra if you are leaking colostrum.  Do not use hot tubs, steam rooms or saunas throughout the pregnancy.  Wear your seat belt at all times when driving. This protects you and your baby if you are in an accident.  Avoid raw meat, uncooked cheese, cat litter boxes, and soil used by cats. These carry germs that can cause birth defects in the baby.  The second trimester is also a good time to visit your dentist for your dental health if this has not been done yet. Getting your teeth cleaned is okay. Use a soft toothbrush. Brush gently during pregnancy.  It is easier to leak urine during pregnancy. Tightening up and strengthening the pelvic muscles will help with this problem. Practice stopping your urination while you are going to the  bathroom. These are the same muscles you need to strengthen. It is also the muscles you would use as if you were trying to stop from passing gas. You can practice tightening these muscles up 10 times a set and repeating this about 3 times per day. Once you know what muscles to tighten up, do not perform these exercises during urination. It is more likely to contribute to an infection by backing up the urine.  Ask for help if you have financial, counseling, or nutritional needs during pregnancy. Your caregiver will be able to offer counseling for these needs as well as refer you for other special needs.  Your skin may become oily. If so, wash your face with mild soap, use non-greasy moisturizer and oil or cream based makeup. MEDICINES AND DRUG USE IN PREGNANCY  Take prenatal vitamins as directed. The vitamin should contain 1 milligram of folic acid. Keep all vitamins out of reach of children. Only a couple vitamins or tablets containing iron may be fatal to a baby or young child when ingested.  Avoid use of all medicines, including herbs, over-the-counter medicines, not prescribed or suggested by your caregiver. Only take over-the-counter or prescription medicines for pain, discomfort, or fever as directed by your caregiver. Do not use aspirin.  Let your caregiver also know about herbs you may be using.  Alcohol is related to a number of birth defects. This includes fetal alcohol syndrome. All alcohol, in any form, should be avoided completely. Smoking will cause low birth rate and premature babies.  Street or illegal drugs are very harmful to the baby. They are absolutely forbidden. A baby born to an addicted mother will be addicted at birth. The baby will go through the same withdrawal an adult does. SEEK MEDICAL CARE IF:  You have any concerns or worries during your pregnancy. It is better to call with your questions if you feel they cannot wait, rather than worry about them. SEEK IMMEDIATE  MEDICAL CARE IF:   An unexplained oral temperature above 102 F (38.9 C) develops, or as your caregiver suggests.  You have leaking of fluid from the vagina (birth canal). If leaking membranes are suspected, take your temperature and tell your caregiver of this when you call.  There is vaginal spotting, bleeding, or passing clots. Tell your caregiver of the amount and how many pads are used. Light spotting in pregnancy is common, especially following intercourse.  You develop a bad smelling vaginal discharge with a change in the color from clear to white.  You continue to feel   sick to your stomach (nauseated) and have no relief from remedies suggested. You vomit blood or coffee ground-like materials.  You lose more than 2 pounds of weight or gain more than 2 pounds of weight over 1 week, or as suggested by your caregiver.  You notice swelling of your face, hands, feet, or legs.  You get exposed to German measles and have never had them.  You are exposed to fifth disease or chickenpox.  You develop belly (abdominal) pain. Round ligament discomfort is a common non-cancerous (benign) cause of abdominal pain in pregnancy. Your caregiver still must evaluate you.  You develop a bad headache that does not go away.  You develop fever, diarrhea, pain with urination, or shortness of breath.  You develop visual problems, blurry, or double vision.  You fall or are in a car accident or any kind of trauma.  There is mental or physical violence at home. Document Released: 11/23/2001 Document Revised: 08/23/2012 Document Reviewed: 05/28/2009 [employer] Patient Information 2014 [employer].  

## 2013-09-25 NOTE — Progress Notes (Signed)
Subjective:    [name] [name] is being seen today for her first obstetrical visit.  This is a planned pregnancy. She is at [redacted]w[redacted]d gestation. Her obstetrical history is significant for pregnancy induced hypertension with her first pregnancy. Relationship with FOB: spouse, living together. Patient does intend to breast feed. Pregnancy history fully reviewed.  Still having headaches- about once every 2 days. Tylenol doesn't work. Sharp stabbing pain. Has to lay in a dark room. Gets photophobia. Worse with standing. fiorecet helped when she was in the emergency room- never got the rx to go home.  No fevers, chills, nausea, vomiting, diarrhea, constipation, weakness.   Menstrual History: OB History   Grav Para Term Preterm Abortions TAB SAB Ect Mult Living   7 5 5  1  1   5      G1- PIH, term, C/S for nuchal cord  G2- term c/s G3- term c/s G4- term c/s G5- term c/s, postpartum hemorrhage, was recommended blood transfusion but refused- was told she had severe scar tissue  G6- SAB G7- current  Patient's last menstrual period was 06/09/2013.   Past Medical History  Diagnosis Date  . Migraine   . Hypertension   . Sickle cell trait   . Depression     PTSD (1997) major depression (2013)   Past Surgical History  Procedure Laterality Date  . Cesarean section     Current Outpatient Prescriptions on File Prior to Visit  Medication Sig Dispense Refill  . acetaminophen (TYLENOL) 500 MG tablet Take 1,000 mg by mouth every 6 (six) hours as needed (headache).      . Prenatal Vit-Fe Fumarate-FA (PRENATAL COMPLETE) 14-0.4 MG TABS Take 1 tablet by mouth daily.  30 each  1   No current facility-administered medications on file prior to visit.   Allergies  Allergen Reactions  . Fish Allergy Rash    Any kind of seafood  . Penicillins Rash    The following portions of the patient's history were reviewed and updated as appropriate: allergies, current medications, past family history, past medical  history, past social history, past surgical history and problem list.  Review of Systems A comprehensive review of systems was negative.    Objective:     BP 148/98  Temp(Src) 98.9 F (37.2 C)  Wt 88.134 kg (194 lb 4.8 oz)  BMI 33.34 kg/m2  LMP 06/09/2013 Filed Vitals:   09/25/13 1311 09/25/13 1330  BP: 145/85 110/60  Temp: 98.9 F (37.2 C)   Weight: 88.134 kg (194 lb 4.8 oz)     GENERAL: Well-developed, well-nourished female in no acute distress.  HEENT: Normocephalic, atraumatic. Sclerae anicteric.  NECK: Supple. Normal thyroid.  LUNGS: Clear to auscultation bilaterally.  HEART: Regular rate and rhythm. BREASTS: deferred ABDOMEN: Soft, nontender, nondistended. No organomegaly. Gravid.  PELVIC: deferred as done before in MAU EXTREMITIES: No cyanosis, clubbing, or edema, 2+ distal pulses. }    Assessment:    Pregnancy at 15 and 3/7 weeks    Plan:    Initial labs drawn. Initial elevated BP but repeat manual was normal. Will cont to follow it [facility] 3 months ago- normal per pt report Hx of 5 previous c/sections with hx of severe scar tissue. Discussed risks of repeat c/s at this time including increased risk of damage to surrounding structures and increased risk of bleeding and possible hysterectomy.   - attempt to get operative reports from previous c/s (particularly last one) Prenatal vitamins. Problem list reviewed and updated. AFP3  discussed: discuss at next visit Role of ultrasound in pregnancy discussed; fetal survey: to order at next visit. Follow up in 3 weeks. 50% of 30 min visit spent on counseling and coordination of care.

## 2013-09-26 LAB — OBSTETRIC PANEL
Antibody Screen: NEGATIVE
Basophils Absolute: 0 10*3/uL (ref 0.0–0.1)
Basophils Relative: 0 % (ref 0–1)
Eosinophils Absolute: 0.1 10*3/uL (ref 0.0–0.7)
Eosinophils Relative: 2 % (ref 0–5)
HCT: 34.7 % — ABNORMAL LOW (ref 36.0–46.0)
MCHC: 34.9 g/dL (ref 30.0–36.0)
MCV: 79 fL (ref 78.0–100.0)
Monocytes Absolute: 0.6 10*3/uL (ref 0.1–1.0)
Platelets: 316 10*3/uL (ref 150–400)
RDW: 15 % (ref 11.5–15.5)

## 2013-09-26 LAB — PRESCRIPTION MONITORING PROFILE (19 PANEL)
Benzodiazepine Screen, Urine: NEGATIVE ng/mL
Buprenorphine, Urine: NEGATIVE ng/mL
Cannabinoid Scrn, Ur: NEGATIVE ng/mL
Cocaine Metabolites: NEGATIVE ng/mL
MDMA URINE: NEGATIVE ng/mL
Meperidine, Ur: NEGATIVE ng/mL
Methaqualone: NEGATIVE ng/mL
Nitrites, Initial: NEGATIVE ug/mL
Phencyclidine, Ur: NEGATIVE ng/mL
Propoxyphene: NEGATIVE ng/mL
Zolpidem, Urine: NEGATIVE ng/mL

## 2013-09-26 LAB — ALCOHOL METABOLITE (ETG), URINE: Ethyl Glucuronide (EtG): NEGATIVE ng/mL

## 2013-09-26 LAB — HIV ANTIBODY (ROUTINE TESTING W REFLEX): HIV: NONREACTIVE

## 2013-09-28 ENCOUNTER — Encounter: Payer: Self-pay | Admitting: *Deleted

## 2013-09-28 LAB — HEMOGLOBINOPATHY EVALUATION
Hemoglobin Other: 0 %
Hgb A2 Quant: 3.2 % (ref 2.2–3.2)
Hgb A: 57.3 % — ABNORMAL LOW (ref 96.8–97.8)
Hgb S Quant: 38.1 % — ABNORMAL HIGH

## 2013-09-28 LAB — CULTURE, OB URINE: Colony Count: >=100000

## 2013-10-01 ENCOUNTER — Encounter: Payer: Self-pay | Admitting: Family Medicine

## 2013-10-01 DIAGNOSIS — D573 Sickle-cell trait: Secondary | ICD-10-CM | POA: Insufficient documentation

## 2013-10-08 ENCOUNTER — Encounter (HOSPITAL_COMMUNITY): Payer: Self-pay | Admitting: Emergency Medicine

## 2013-10-08 ENCOUNTER — Emergency Department (HOSPITAL_COMMUNITY)
Admission: EM | Admit: 2013-10-08 | Discharge: 2013-10-08 | Disposition: A | Discharge status: Home / Self Care | Payer: Medicaid Other | Source: Home / Self Care | Attending: Emergency Medicine | Admitting: Emergency Medicine

## 2013-10-08 DIAGNOSIS — J069 Acute upper respiratory infection, unspecified: Secondary | ICD-10-CM

## 2013-10-08 DIAGNOSIS — J300 Vasomotor rhinitis: Secondary | ICD-10-CM

## 2013-10-08 DIAGNOSIS — J309 Allergic rhinitis, unspecified: Secondary | ICD-10-CM

## 2013-10-08 HISTORY — DX: Encounter for supervision of normal pregnancy, unspecified, unspecified trimester: Z34.90

## 2013-10-08 LAB — POCT RAPID STREP A: Streptococcus, Group A Screen (Direct): NEGATIVE

## 2013-10-08 MED ORDER — LEVOCETIRIZINE DIHYDROCHLORIDE 5 MG PO TABS: 5.0000 mg | ORAL_TABLET | Freq: Every evening | ORAL | Status: DC

## 2013-10-08 NOTE — ED Notes (Signed)
Patient left without receiving discharge instructions.

## 2013-10-08 NOTE — ED Notes (Signed)
Sore throat and headache, head stuffiness.  Patient is pregnant, lmp 6/20.

## 2013-10-08 NOTE — ED Notes (Signed)
Patient requesting a different medication since prior authorization required, dr Lorenz Coaster reports zyrtec is the closest to script written (over the counter) medication choice determined by patient's report of medications tried.

## 2013-10-08 NOTE — ED Notes (Signed)
Patient returned to department

## 2013-10-08 NOTE — ED Provider Notes (Signed)
Chief Complaint:   Chief Complaint  Patient presents with  . Sore Throat  . Headache    History of Present Illness:   [name] [name] is a 32 year old female who is 17 weeks, 4 days pregnant who has had a two-day history of sore throat, nasal congestion with clear rhinorrhea, headache, watery eyes, and dry cough. She has a history of allergies going on for several weeks with nasal congestion and rhinorrhea. She's tried all over-the-counter antihistamines without relief. She denies fever, chills, ear congestion, wheezing, chest tightness, chest pain, or GI symptoms. She has had no sick exposures. She denies any pregnancy-related complications and has not had any bleeding, high blood pressure, or diabetes.  Review of Systems:  Other than noted above, the patient denies any of the following symptoms: Systemic:  No fevers, chills, sweats, weight loss or gain, fatigue, or tiredness. Eye:  No redness or discharge. ENT:  No ear pain, drainage, headache, nasal congestion, drainage, sinus pressure, difficulty swallowing, or sore throat. Neck:  No neck pain or swollen glands. Lungs:  No cough, sputum production, hemoptysis, wheezing, chest tightness, shortness of breath or chest pain. GI:  No abdominal pain, nausea, vomiting or diarrhea.  PMFSH:  Past medical history, family history, social history, meds, and allergies were reviewed. She is allergic to penicillin. She takes prenatal vitamins and Phenergan.  Physical Exam:   Vital signs:  BP 115/79  Pulse 97  Temp(Src) 98.3 F (36.8 C) (Oral)  Resp 22  SpO2 98%  LMP 06/09/2013 General:  Alert and oriented.  In no distress.  Skin warm and dry. Eye:  No conjunctival injection or drainage. Lids were normal. ENT:  TMs and canals were normal, without erythema or inflammation.  Nasal mucosa was clear and uncongested, without drainage.  Mucous membranes were moist.  Pharynx was clear with no exudate or drainage.  There were no oral ulcerations or  lesions. Neck:  Supple, no adenopathy, tenderness or mass. Lungs:  No respiratory distress.  Lungs were clear to auscultation, without wheezes, rales or rhonchi.  Breath sounds were clear and equal bilaterally.  Heart:  Regular rhythm, without gallops, murmers or rubs. Skin:  Clear, warm, and dry, without rash or lesions.  Labs:   Results for orders placed during the hospital encounter of 10/08/13  POCT RAPID STREP A ([facility] CARE ONLY)      Result Value Range   Streptococcus, Group A Screen (Direct) NEGATIVE  NEGATIVE     Assessment:  The primary encounter diagnosis was Viral URI. Diagnoses of Vasomotor rhinitis and Allergic rhinitis were also pertinent to this visit.  She appears to have a viral upper respiratory infection complicated by either allergic rhinitis or vasomotor rhinitis secondary to her pregnancy or both. She's tried all over-the-counter antihistamines. The only safe thing for her to take right now will be Xyzal.  Plan:   1.  Meds:  The following meds were prescribed:   Discharge Medication List as of 10/08/2013  9:18 AM    START taking these medications   Details  levocetirizine (XYZAL) 5 MG tablet Take 1 tablet (5 mg total) by mouth every evening., Starting 10/08/2013, Until Discontinued, Normal        2.  Patient Education/Counseling:  The patient was given appropriate handouts, self care instructions, and instructed in symptomatic relief.    3.  Follow up:  The patient was told to follow up if no better in 3 to 4 days, if becoming worse in any way, and given some  red flag symptoms such as fever or difficulty breathing which would prompt immediate return.  Follow up here as needed.      Reuben Likes, MD 10/08/13 [phone]

## 2013-10-10 LAB — CULTURE, GROUP A STREP

## 2013-10-10 NOTE — ED Notes (Signed)
Final report throat culture negative for group A strep

## 2013-10-21 ENCOUNTER — Encounter (HOSPITAL_COMMUNITY): Payer: Self-pay | Admitting: *Deleted

## 2013-10-21 ENCOUNTER — Inpatient Hospital Stay (HOSPITAL_COMMUNITY)
Admission: AD | Admit: 2013-10-21 | Discharge: 2013-10-21 | Disposition: A | Discharge status: Home / Self Care | Payer: Medicaid Other | Source: Ambulatory Visit | Attending: Obstetrics & Gynecology | Admitting: Obstetrics & Gynecology

## 2013-10-21 ENCOUNTER — Inpatient Hospital Stay (HOSPITAL_COMMUNITY): Payer: Medicaid Other

## 2013-10-21 DIAGNOSIS — R109 Unspecified abdominal pain: Secondary | ICD-10-CM | POA: Insufficient documentation

## 2013-10-21 DIAGNOSIS — Y92009 Unspecified place in unspecified non-institutional (private) residence as the place of occurrence of the external cause: Secondary | ICD-10-CM | POA: Insufficient documentation

## 2013-10-21 DIAGNOSIS — O99891 Other specified diseases and conditions complicating pregnancy: Secondary | ICD-10-CM | POA: Insufficient documentation

## 2013-10-21 DIAGNOSIS — G43909 Migraine, unspecified, not intractable, without status migrainosus: Secondary | ICD-10-CM

## 2013-10-21 DIAGNOSIS — R51 Headache: Secondary | ICD-10-CM | POA: Insufficient documentation

## 2013-10-21 DIAGNOSIS — R110 Nausea: Secondary | ICD-10-CM

## 2013-10-21 LAB — URINALYSIS, ROUTINE W REFLEX MICROSCOPIC
Bilirubin Urine: NEGATIVE
Glucose, UA: NEGATIVE mg/dL
Hgb urine dipstick: NEGATIVE
Leukocytes, UA: NEGATIVE
Specific Gravity, Urine: 1.025 (ref 1.005–1.030)
Urobilinogen, UA: 0.2 mg/dL (ref 0.0–1.0)
pH: 6 (ref 5.0–8.0)

## 2013-10-21 MED ORDER — DEXAMETHASONE 0.5 MG PO TABS
1.0000 mg | ORAL_TABLET | Freq: Once | ORAL | Status: AC
  Administered 2013-10-21: 1 mg via ORAL
  Filled 2013-10-21: qty 2

## 2013-10-21 MED ORDER — LACTATED RINGERS IV BOLUS (SEPSIS): 1000.0000 mL | Freq: Once | INTRAVENOUS | Status: DC

## 2013-10-21 MED ORDER — METOCLOPRAMIDE HCL 5 MG/ML IJ SOLN: 10.0000 mg | Freq: Once | INTRAMUSCULAR | Status: DC

## 2013-10-21 MED ORDER — DIPHENHYDRAMINE HCL 50 MG/ML IJ SOLN: 25.0000 mg | Freq: Once | INTRAMUSCULAR | Status: DC

## 2013-10-21 MED ORDER — DEXAMETHASONE SODIUM PHOSPHATE 10 MG/ML IJ SOLN: 10.0000 mg | Freq: Once | INTRAMUSCULAR | Status: DC

## 2013-10-21 MED ORDER — METOCLOPRAMIDE HCL 10 MG PO TABS
10.0000 mg | ORAL_TABLET | Freq: Once | ORAL | Status: AC
  Administered 2013-10-21: 10 mg via ORAL
  Filled 2013-10-21: qty 1

## 2013-10-21 MED ORDER — DIPHENHYDRAMINE HCL 25 MG PO CAPS
25.0000 mg | ORAL_CAPSULE | Freq: Once | ORAL | Status: AC
  Administered 2013-10-21: 25 mg via ORAL
  Filled 2013-10-21: qty 1
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
## 2013-10-21 NOTE — MAU Note (Signed)
Patient not in room, BR or MAU lobby. Provider notified.
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
## 2013-10-21 NOTE — MAU Note (Signed)
In to start IVFs and pt states she would rather have something by mouth not IVFs. [name] Hoit NP notified and will change orders. Pt aware. Will proceed with u/s
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
## 2013-10-23 ENCOUNTER — Ambulatory Visit (INDEPENDENT_AMBULATORY_CARE_PROVIDER_SITE_OTHER): Payer: Medicaid Other | Admitting: Family Medicine

## 2013-10-23 ENCOUNTER — Encounter: Payer: Self-pay | Admitting: Family Medicine

## 2013-10-23 VITALS — BP 128/83 | Temp 98.1°F | Wt 196.5 lb

## 2013-10-23 DIAGNOSIS — Z3481 Encounter for supervision of other normal pregnancy, first trimester: Secondary | ICD-10-CM

## 2013-10-23 DIAGNOSIS — O34219 Maternal care for unspecified type scar from previous cesarean delivery: Secondary | ICD-10-CM

## 2013-10-23 DIAGNOSIS — D573 Sickle-cell trait: Secondary | ICD-10-CM

## 2013-10-23 DIAGNOSIS — Z3482 Encounter for supervision of other normal pregnancy, second trimester: Secondary | ICD-10-CM

## 2013-10-23 LAB — POCT URINALYSIS DIP (DEVICE)
Bilirubin Urine: NEGATIVE
Hgb urine dipstick: NEGATIVE
Ketones, ur: NEGATIVE mg/dL
Leukocytes, UA: NEGATIVE
Nitrite: NEGATIVE
Specific Gravity, Urine: 1.015 (ref 1.005–1.030)
pH: 6 (ref 5.0–8.0)

## 2013-10-23 MED ORDER — LORATADINE 10 MG PO TABS: 10.0000 mg | ORAL_TABLET | Freq: Every day | ORAL | Status: DC

## 2013-10-23 MED ORDER — PROMETHAZINE HCL 25 MG PO TABS: 25.0000 mg | ORAL_TABLET | Freq: Four times a day (QID) | ORAL | Status: DC | PRN

## 2013-10-23 NOTE — Progress Notes (Signed)
P=97  Pt c/o of headache for four days with no relief with medication

## 2013-10-23 NOTE — Progress Notes (Signed)
S: pt here for [redacted]w[redacted]d visit Still having headaches- every 3 days. 10/10 pain. Usually pain in the middle of her head. Isn't unilateral. No photo/phonophobia.  fiorecet doesn't work. Tylenol doesn't work. Throbbing pain. Doesn't wake her up at night. No fevers, chills, nausea, vomiting. Has a hx of migraines but this is different. Drinks mostly pepsi and juice. Never drinks water.  No ctx, lof, vb. +FM  O: see flowsheet  A/P - Anatomy US ordered - quad ordered - headaches discussed- pt drinks 6 cans of pepsi and occasional juice. No water. Discussed headaches are due to caffeine amount and dehydration. Pt to try drinking gatorade and decreasing soda intake. Will re-evaluate at next visit.

## 2013-10-23 NOTE — Patient Instructions (Signed)
Second Trimester of Pregnancy The second trimester is from week 13 through week 28, months 4 through 6. The second trimester is often a time when you feel your best. Your body has also adjusted to being pregnant, and you begin to feel better physically. Usually, morning sickness has lessened or quit completely, you may have more energy, and you may have an increase in appetite. The second trimester is also a time when the fetus is growing rapidly. At the end of the sixth month, the fetus is about 9 inches long and weighs about 1 pounds. You will likely begin to feel the baby move (quickening) between 18 and 20 weeks of the pregnancy. BODY CHANGES Your body goes through many changes during pregnancy. The changes vary from woman to woman.   Your weight will continue to increase. You will notice your lower abdomen bulging out.  You may begin to get stretch marks on your hips, abdomen, and breasts.  You may develop headaches that can be relieved by medicines approved by your caregiver.  You may urinate more often because the fetus is pressing on your bladder.  You may develop or continue to have heartburn as a result of your pregnancy.  You may develop constipation because certain hormones are causing the muscles that push waste through your intestines to slow down.  You may develop hemorrhoids or swollen, bulging veins (varicose veins).  You may have back pain because of the weight gain and pregnancy hormones relaxing your joints between the bones in your pelvis and as a result of a shift in weight and the muscles that support your balance.  Your breasts will continue to grow and be tender.  Your gums may bleed and may be sensitive to brushing and flossing.  Dark spots or blotches (chloasma, mask of pregnancy) may develop on your face. This will likely fade after the baby is born.  A dark line from your belly button to the pubic area (linea nigra) may appear. This will likely fade after the  baby is born. WHAT TO EXPECT AT YOUR PRENATAL VISITS During a routine prenatal visit:  You will be weighed to make sure you and the fetus are growing normally.  Your blood pressure will be taken.  Your abdomen will be measured to track your baby's growth.  The fetal heartbeat will be listened to.  Any test results from the previous visit will be discussed. Your caregiver may ask you:  How you are feeling.  If you are feeling the baby move.  If you have had any abnormal symptoms, such as leaking fluid, bleeding, severe headaches, or abdominal cramping.  If you have any questions. Other tests that may be performed during your second trimester include:  Blood tests that check for:  Low iron levels (anemia).  Gestational diabetes (between 24 and 28 weeks).  Rh antibodies.  Urine tests to check for infections, diabetes, or protein in the urine.  An ultrasound to confirm the proper growth and development of the baby.  An amniocentesis to check for possible genetic problems.  Fetal screens for spina bifida and Down syndrome. HOME CARE INSTRUCTIONS   Avoid all smoking, herbs, alcohol, and unprescribed drugs. These chemicals affect the formation and growth of the baby.  Follow your caregiver's instructions regarding medicine use. There are medicines that are either safe or unsafe to take during pregnancy.  Exercise only as directed by your caregiver. Experiencing uterine cramps is a good sign to stop exercising.  Continue to eat regular,   healthy meals.  Wear a good support bra for breast tenderness.  Do not use hot tubs, steam rooms, or saunas.  Wear your seat belt at all times when driving.  Avoid raw meat, uncooked cheese, cat litter boxes, and soil used by cats. These carry germs that can cause birth defects in the baby.  Take your prenatal vitamins.  Try taking a stool softener (if your caregiver approves) if you develop constipation. Eat more high-fiber foods,  such as fresh vegetables or fruit and whole grains. Drink plenty of fluids to keep your urine clear or pale yellow.  Take warm sitz baths to soothe any pain or discomfort caused by hemorrhoids. Use hemorrhoid cream if your caregiver approves.  If you develop varicose veins, wear support hose. Elevate your feet for 15 minutes, 3 4 times a day. Limit salt in your diet.  Avoid heavy lifting, wear low heel shoes, and practice good posture.  Rest with your legs elevated if you have leg cramps or low back pain.  Visit your dentist if you have not gone yet during your pregnancy. Use a soft toothbrush to brush your teeth and be gentle when you floss.  A sexual relationship may be continued unless your caregiver directs you otherwise.  Continue to go to all your prenatal visits as directed by your caregiver. SEEK MEDICAL CARE IF:   You have dizziness.  You have mild pelvic cramps, pelvic pressure, or nagging pain in the abdominal area.  You have persistent nausea, vomiting, or diarrhea.  You have a bad smelling vaginal discharge.  You have pain with urination. SEEK IMMEDIATE MEDICAL CARE IF:   You have a fever.  You are leaking fluid from your vagina.  You have spotting or bleeding from your vagina.  You have severe abdominal cramping or pain.  You have rapid weight gain or loss.  You have shortness of breath with chest pain.  You notice sudden or extreme swelling of your face, hands, ankles, feet, or legs.  You have not felt your baby move in over an hour.  You have severe headaches that do not go away with medicine.  You have vision changes. Document Released: 11/23/2001 Document Revised: 08/01/2013 Document Reviewed: 01/30/2013 [employer] Patient Information 2014 [employer].  

## 2013-10-25 LAB — AFP, QUAD SCREEN
Age Alone: 1:492 {titer}
Down Syndrome Scr Risk Est: 1:10800 {titer}
HCG, Total: 6276 m[IU]/mL
INH: 129.2 pg/mL
MoM for AFP: 0.88
MoM for INH: 0.82
MoM for hCG: 0.47
Osb Risk: 1:40200 {titer}
Tri 18 Scr Risk Est: NEGATIVE
Trisomy 18 (Edward) Syndrome Interp.: 1:9160 {titer}

## 2013-10-31 ENCOUNTER — Ambulatory Visit (HOSPITAL_COMMUNITY): Admission: RE | Admit: 2013-10-31 | Payer: Medicaid Other | Source: Ambulatory Visit

## 2013-10-31 ENCOUNTER — Ambulatory Visit (HOSPITAL_COMMUNITY)
Admission: RE | Admit: 2013-10-31 | Discharge: 2013-10-31 | Disposition: A | Discharge status: Home / Self Care | Payer: Medicaid Other | Source: Ambulatory Visit | Attending: Family Medicine | Admitting: Family Medicine

## 2013-10-31 DIAGNOSIS — O34219 Maternal care for unspecified type scar from previous cesarean delivery: Secondary | ICD-10-CM | POA: Insufficient documentation

## 2013-10-31 DIAGNOSIS — Z3689 Encounter for other specified antenatal screening: Secondary | ICD-10-CM | POA: Insufficient documentation

## 2013-10-31 DIAGNOSIS — Z3482 Encounter for supervision of other normal pregnancy, second trimester: Secondary | ICD-10-CM

## 2013-11-02 ENCOUNTER — Encounter: Payer: Self-pay | Admitting: Family Medicine

## 2013-11-02 ENCOUNTER — Other Ambulatory Visit: Payer: Medicaid Other

## 2013-11-23 ENCOUNTER — Ambulatory Visit (INDEPENDENT_AMBULATORY_CARE_PROVIDER_SITE_OTHER): Payer: Medicaid Other | Admitting: Obstetrics & Gynecology

## 2013-11-23 ENCOUNTER — Encounter: Payer: Self-pay | Admitting: Obstetrics & Gynecology

## 2013-11-23 VITALS — BP 117/78 | Wt 199.0 lb

## 2013-11-23 DIAGNOSIS — O34219 Maternal care for unspecified type scar from previous cesarean delivery: Secondary | ICD-10-CM

## 2013-11-23 LAB — POCT URINALYSIS DIP (DEVICE)
Bilirubin Urine: NEGATIVE
Glucose, UA: NEGATIVE mg/dL
Hgb urine dipstick: NEGATIVE
Ketones, ur: NEGATIVE mg/dL
Leukocytes, UA: NEGATIVE
Nitrite: NEGATIVE
Urobilinogen, UA: 0.2 mg/dL (ref 0.0–1.0)

## 2013-11-23 NOTE — Patient Instructions (Signed)
Second Trimester of Pregnancy The second trimester is from week 13 through week 28, months 4 through 6. The second trimester is often a time when you feel your best. Your body has also adjusted to being pregnant, and you begin to feel better physically. Usually, morning sickness has lessened or quit completely, you may have more energy, and you may have an increase in appetite. The second trimester is also a time when the fetus is growing rapidly. At the end of the sixth month, the fetus is about 9 inches long and weighs about 1 pounds. You will likely begin to feel the baby move (quickening) between 18 and 20 weeks of the pregnancy. BODY CHANGES Your body goes through many changes during pregnancy. The changes vary from woman to woman.   Your weight will continue to increase. You will notice your lower abdomen bulging out.  You may begin to get stretch marks on your hips, abdomen, and breasts.  You may develop headaches that can be relieved by medicines approved by your caregiver.  You may urinate more often because the fetus is pressing on your bladder.  You may develop or continue to have heartburn as a result of your pregnancy.  You may develop constipation because certain hormones are causing the muscles that push waste through your intestines to slow down.  You may develop hemorrhoids or swollen, bulging veins (varicose veins).  You may have back pain because of the weight gain and pregnancy hormones relaxing your joints between the bones in your pelvis and as a result of a shift in weight and the muscles that support your balance.  Your breasts will continue to grow and be tender.  Your gums may bleed and may be sensitive to brushing and flossing.  Dark spots or blotches (chloasma, mask of pregnancy) may develop on your face. This will likely fade after the baby is born.  A dark line from your belly button to the pubic area (linea nigra) may appear. This will likely fade after the  baby is born. WHAT TO EXPECT AT YOUR PRENATAL VISITS During a routine prenatal visit:  You will be weighed to make sure you and the fetus are growing normally.  Your blood pressure will be taken.  Your abdomen will be measured to track your baby's growth.  The fetal heartbeat will be listened to.  Any test results from the previous visit will be discussed. Your caregiver may ask you:  How you are feeling.  If you are feeling the baby move.  If you have had any abnormal symptoms, such as leaking fluid, bleeding, severe headaches, or abdominal cramping.  If you have any questions. Other tests that may be performed during your second trimester include:  Blood tests that check for:  Low iron levels (anemia).  Gestational diabetes (between 24 and 28 weeks).  Rh antibodies.  Urine tests to check for infections, diabetes, or protein in the urine.  An ultrasound to confirm the proper growth and development of the baby.  An amniocentesis to check for possible genetic problems.  Fetal screens for spina bifida and Down syndrome. HOME CARE INSTRUCTIONS   Avoid all smoking, herbs, alcohol, and unprescribed drugs. These chemicals affect the formation and growth of the baby.  Follow your caregiver's instructions regarding medicine use. There are medicines that are either safe or unsafe to take during pregnancy.  Exercise only as directed by your caregiver. Experiencing uterine cramps is a good sign to stop exercising.  Continue to eat regular,   healthy meals.  Wear a good support bra for breast tenderness.  Do not use hot tubs, steam rooms, or saunas.  Wear your seat belt at all times when driving.  Avoid raw meat, uncooked cheese, cat litter boxes, and soil used by cats. These carry germs that can cause birth defects in the baby.  Take your prenatal vitamins.  Try taking a stool softener (if your caregiver approves) if you develop constipation. Eat more high-fiber foods,  such as fresh vegetables or fruit and whole grains. Drink plenty of fluids to keep your urine clear or pale yellow.  Take warm sitz baths to soothe any pain or discomfort caused by hemorrhoids. Use hemorrhoid cream if your caregiver approves.  If you develop varicose veins, wear support hose. Elevate your feet for 15 minutes, 3 4 times a day. Limit salt in your diet.  Avoid heavy lifting, wear low heel shoes, and practice good posture.  Rest with your legs elevated if you have leg cramps or low back pain.  Visit your dentist if you have not gone yet during your pregnancy. Use a soft toothbrush to brush your teeth and be gentle when you floss.  A sexual relationship may be continued unless your caregiver directs you otherwise.  Continue to go to all your prenatal visits as directed by your caregiver. SEEK MEDICAL CARE IF:   You have dizziness.  You have mild pelvic cramps, pelvic pressure, or nagging pain in the abdominal area.  You have persistent nausea, vomiting, or diarrhea.  You have a bad smelling vaginal discharge.  You have pain with urination. SEEK IMMEDIATE MEDICAL CARE IF:   You have a fever.  You are leaking fluid from your vagina.  You have spotting or bleeding from your vagina.  You have severe abdominal cramping or pain.  You have rapid weight gain or loss.  You have shortness of breath with chest pain.  You notice sudden or extreme swelling of your face, hands, ankles, feet, or legs.  You have not felt your baby move in over an hour.  You have severe headaches that do not go away with medicine.  You have vision changes. Document Released: 11/23/2001 Document Revised: 08/01/2013 Document Reviewed: 01/30/2013 [employer] Patient Information 2014 [employer].  

## 2013-11-23 NOTE — Progress Notes (Signed)
Pulse-  95 
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
## 2013-12-21 ENCOUNTER — Encounter: Payer: Medicaid Other | Admitting: Obstetrics & Gynecology

## 2013-12-24 ENCOUNTER — Encounter (HOSPITAL_COMMUNITY): Payer: Self-pay | Admitting: *Deleted

## 2013-12-24 ENCOUNTER — Inpatient Hospital Stay (HOSPITAL_COMMUNITY)
Admission: EM | Admit: 2013-12-24 | Discharge: 2013-12-24 | Disposition: A | Discharge status: Home / Self Care | Payer: Medicaid Other | Attending: Family Medicine | Admitting: Family Medicine

## 2013-12-24 DIAGNOSIS — O26899 Other specified pregnancy related conditions, unspecified trimester: Secondary | ICD-10-CM

## 2013-12-24 DIAGNOSIS — R109 Unspecified abdominal pain: Secondary | ICD-10-CM | POA: Insufficient documentation

## 2013-12-24 DIAGNOSIS — O4703 False labor before 37 completed weeks of gestation, third trimester: Secondary | ICD-10-CM

## 2013-12-24 DIAGNOSIS — O4700 False labor before 37 completed weeks of gestation, unspecified trimester: Secondary | ICD-10-CM | POA: Insufficient documentation

## 2013-12-24 DIAGNOSIS — O9989 Other specified diseases and conditions complicating pregnancy, childbirth and the puerperium: Principal | ICD-10-CM

## 2013-12-24 DIAGNOSIS — N898 Other specified noninflammatory disorders of vagina: Secondary | ICD-10-CM | POA: Insufficient documentation

## 2013-12-24 DIAGNOSIS — O99891 Other specified diseases and conditions complicating pregnancy: Secondary | ICD-10-CM | POA: Insufficient documentation

## 2013-12-24 LAB — CBC WITH DIFFERENTIAL/PLATELET
BASOS ABS: 0 10*3/uL (ref 0.0–0.1)
BASOS PCT: 0 % (ref 0–1)
Eosinophils Absolute: 0.1 10*3/uL (ref 0.0–0.7)
Eosinophils Relative: 1 % (ref 0–5)
HEMATOCRIT: 36.4 % (ref 36.0–46.0)
Hemoglobin: 12.6 g/dL (ref 12.0–15.0)
LYMPHS PCT: 14 % (ref 12–46)
Lymphs Abs: 1.3 10*3/uL (ref 0.7–4.0)
MCH: 28.1 pg (ref 26.0–34.0)
MCHC: 34.6 g/dL (ref 30.0–36.0)
MCV: 81.1 fL (ref 78.0–100.0)
MONO ABS: 0.8 10*3/uL (ref 0.1–1.0)
Monocytes Relative: 8 % (ref 3–12)
Neutro Abs: 7.6 10*3/uL (ref 1.7–7.7)
Neutrophils Relative %: 78 % — ABNORMAL HIGH (ref 43–77)
Platelets: 275 10*3/uL (ref 150–400)
RBC: 4.49 MIL/uL (ref 3.87–5.11)
RDW: 13.7 % (ref 11.5–15.5)
WBC: 9.8 10*3/uL (ref 4.0–10.5)

## 2013-12-24 LAB — URINALYSIS, ROUTINE W REFLEX MICROSCOPIC
Bilirubin Urine: NEGATIVE
Glucose, UA: NEGATIVE mg/dL
HGB URINE DIPSTICK: NEGATIVE
Ketones, ur: NEGATIVE mg/dL
Leukocytes, UA: NEGATIVE
NITRITE: NEGATIVE
Protein, ur: NEGATIVE mg/dL
SPECIFIC GRAVITY, URINE: 1.013 (ref 1.005–1.030)
UROBILINOGEN UA: 0.2 mg/dL (ref 0.0–1.0)
pH: 6 (ref 5.0–8.0)

## 2013-12-24 LAB — WET PREP, GENITAL
CLUE CELLS WET PREP: NONE SEEN
TRICH WET PREP: NONE SEEN
WBC WET PREP: NONE SEEN
Yeast Wet Prep HPF POC: NONE SEEN

## 2013-12-24 LAB — COMPREHENSIVE METABOLIC PANEL
ALT: 13 U/L (ref 0–35)
AST: 21 U/L (ref 0–37)
Albumin: 3.1 g/dL — ABNORMAL LOW (ref 3.5–5.2)
Alkaline Phosphatase: 84 U/L (ref 39–117)
BUN: 6 mg/dL (ref 6–23)
CALCIUM: 9.1 mg/dL (ref 8.4–10.5)
CO2: 22 meq/L (ref 19–32)
CREATININE: 0.51 mg/dL (ref 0.50–1.10)
Chloride: 103 mEq/L (ref 96–112)
GFR calc Af Amer: >90 mL/min (ref >90)
Glucose, Bld: 71 mg/dL (ref 70–99)
Potassium: 4.1 mEq/L (ref 3.7–5.3)
Sodium: 139 mEq/L (ref 137–147)
Total Bilirubin: <0.2 mg/dL — ABNORMAL LOW (ref 0.3–1.2)
Total Protein: 7.8 g/dL (ref 6.0–8.3)

## 2013-12-24 LAB — POCT FERN TEST: POCT Fern Test: NEGATIVE

## 2013-12-24 MED ORDER — NIFEDIPINE 10 MG PO CAPS
10.0000 mg | ORAL_CAPSULE | Freq: Once | ORAL | Status: AC
  Administered 2013-12-24: 10 mg via ORAL
  Filled 2013-12-24: qty 1

## 2013-12-24 MED ORDER — SODIUM CHLORIDE 0.9 % IV BOLUS (SEPSIS)
1000.0000 mL | Freq: Once | INTRAVENOUS | Status: AC
  Administered 2013-12-24: 1000 mL via INTRAVENOUS
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
## 2013-12-24 NOTE — Progress Notes (Signed)
Pt is 282/[redacted] weeks pregnant. G7P5.. States she has had all cesarean delivieries. States she has a hx of PIH. States she was at school today and started to have lower abd pain. Denies vaginal bleeding,but says that she felt like she was leaking fluid at about 0300 this morning.Denies headache at this time. States her vision is blurry and that she was experiencing dizziness while driving herself to the hospital. [facility].

## 2013-12-24 NOTE — Progress Notes (Signed)
[facility] here.  

## 2013-12-24 NOTE — Discharge Instructions (Signed)
Abdominal Pain During Pregnancy °Abdominal pain is common in pregnancy. Most of the time, it does not cause harm. There are many causes of abdominal pain. Some causes are more serious than others. Some of the causes of abdominal pain in pregnancy are easily diagnosed. Occasionally, the diagnosis takes time to understand. Other times, the cause is not determined. Abdominal pain can be a sign that something is very wrong with the pregnancy, or the pain may have nothing to do with the pregnancy at all. For this reason, always tell your health care provider if you have any abdominal discomfort. °HOME CARE INSTRUCTIONS  °Monitor your abdominal pain for any changes. The following actions may help to alleviate any discomfort you are experiencing: °· Do not have sexual intercourse or put anything in your vagina until your symptoms go away completely. °· Get plenty of rest until your pain improves. °· Drink clear fluids if you feel nauseous. Avoid solid food as long as you are uncomfortable or nauseous. °· Only take over-the-counter or prescription medicine as directed by your health care provider. °· Keep all follow-up appointments with your health care provider. °SEEK IMMEDIATE MEDICAL CARE IF: °· You are bleeding, leaking fluid, or passing tissue from the vagina. °· You have increasing pain or cramping. °· You have persistent vomiting. °· You have painful or bloody urination. °· You have a fever. °· You notice a decrease in your baby's movements. °· You have extreme weakness or feel faint. °· You have shortness of breath, with or without abdominal pain. °· You develop a severe headache with abdominal pain. °· You have abnormal vaginal discharge with abdominal pain. °· You have persistent diarrhea. °· You have abdominal pain that continues even after rest, or gets worse. °MAKE SURE YOU:  °· Understand these instructions. °· Will watch your condition. °· Will get help right away if you are not doing well or get  worse. °Document Released: 11/29/2005 Document Revised: 09/19/2013 Document Reviewed: 06/28/2013 °Exit[employer]® Patient Information ©2014 [employer]. ° °

## 2013-12-24 NOTE — Progress Notes (Signed)
Spoke with Dr. Shawnie PonsPratt Pt [facility].

## 2013-12-24 NOTE — ED Provider Notes (Signed)
CSN: [id number]     Arrival date & time 12/24/13  1149 History   First MD Initiated Contact with Patient 12/24/13 1227     Chief Complaint  Patient presents with  . Abdominal Pain   HPI  [name] is a 33 y/o female G7P5 currently at 26 weeks [facility] who presents with cc of abdominal pain. The patient states that for the past two days she has had intermittent "crampy" lower midline abdominal pain. Symptoms come and go without modifying factors. States she has an episode once every 2 hours and the pain generally last for 15 minutes. She states she has had similar pain during her pregnancy but hasn't told her OB about them because they haven't been as bad. For the past two days she has had increased urinary frequency. She states on 3 occasions she had gushes of fluids which she thinks is urine. She last had one of those episodes last night at 0300. Midnight last night the patient had an intense amount of pain. Since that time she has felt decreased FM. She denies any vaginal bleeding, fevers, vomiting or changes in bowel movements. She states she has had associated blurry vision.   Past Medical History  Diagnosis Date  . Migraine   . Hypertension   . Sickle cell trait   . Depression     PTSD (1997) major depression (2013)  . Pregnant    Past Surgical History  Procedure Laterality Date  . Cesarean section     Family History  Problem Relation Age of Onset  . Hypertension Mother   . Hypertension Father   . Diabetes Father    History  Substance Use Topics  . Smoking status: Never Smoker   . Smokeless tobacco: Not on file  . Alcohol Use: No     Comment: ocassionaly before pregnancy   OB History   Grav Para Term Preterm Abortions TAB SAB Ect Mult Living   7 5 5  1  1   5      Review of Systems  Constitutional: Negative for fever and chills.  Eyes: Positive for visual disturbance.  Respiratory: Negative for shortness of breath.   Cardiovascular: Negative for chest pain.   Gastrointestinal: Positive for nausea and abdominal pain. Negative for vomiting.  Genitourinary: Positive for genital sores. Negative for dysuria, vaginal bleeding and vaginal discharge.  All other systems reviewed and are negative.    Allergies  Fish allergy and Penicillins  Home Medications   Current Outpatient Rx  Name  Route  Sig  Dispense  Refill  . Prenatal Vit-Fe Fumarate-FA (PRENATAL MULTIVITAMIN) TABS tablet   Oral   Take 1 tablet by mouth daily at 12 noon.         . promethazine (PHENERGAN) 25 MG tablet   Oral   Take 1 tablet (25 mg total) by mouth every 6 (six) hours as needed for nausea.   30 tablet   0   . butalbital-acetaminophen-caffeine (FIORICET) 50-325-40 MG per tablet   Oral   Take 1-2 tablets by mouth every 6 (six) hours as needed for headache.   20 tablet   0    BP 122/65  Pulse 119  Temp(Src) 98.2 F (36.8 C) (Oral)  Resp 20  SpO2 100%  LMP 06/09/2013 Physical Exam  Nursing note and vitals reviewed. Constitutional: She is oriented to person, place, and time. She appears well-developed and well-nourished. No distress.  HENT:  Head: Normocephalic and atraumatic.  Eyes: Conjunctivae are normal. Pupils  are equal, round, and reactive to light.  Neck: Normal range of motion. Neck supple.  Cardiovascular: Normal rate and regular rhythm.  Exam reveals no gallop and no friction rub.   No murmur heard. Pulmonary/Chest: Effort normal and breath sounds normal.  Abdominal: Soft. She exhibits no distension. There is no tenderness.  Gravid Uterus. No focal tenderness.   Musculoskeletal: Normal range of motion. She exhibits no edema and no tenderness.  Neurological: She is alert and oriented to person, place, and time. She has normal strength and normal reflexes. No cranial nerve deficit or sensory deficit.  Skin: Skin is warm and dry.  Psychiatric: She has a normal mood and affect.    ED Course  Procedures (including critical care time) Labs  Review Labs Reviewed  CBC WITH DIFFERENTIAL - Abnormal; Notable for the following:    Neutrophils Relative % 78 (*)    All other components within normal limits  COMPREHENSIVE METABOLIC PANEL - Abnormal; Notable for the following:    Albumin 3.1 (*)    Total Bilirubin <0.2 (*)    All other components within normal limits  WET PREP, GENITAL  URINE CULTURE  URINALYSIS, ROUTINE W REFLEX MICROSCOPIC  POCT FERN TEST   Imaging Review No results found.  EKG Interpretation    Date/Time:    Ventricular Rate:    PR Interval:    QRS Duration:   QT Interval:    QTC Calculation:   R Axis:     Text Interpretation:              MDM   Here with cc of abdominal pain. Afebrile. VSS. Well appearing. Non-focal abdominal exam. Labs unremarkable. UA not c/w UTI. Given IVF. TOCO with reassuring FHR and irregular contractions. The patient was transferred to [facility] for rule out rupture given episodes of gushes of fluid.    1. Abdominal pain complicating pregnancy   2. Preterm contractions, third trimester        Shanon Ace, MD 12/24/13 2013

## 2013-12-24 NOTE — MAU Note (Signed)
[facility] with C/O pelvic pressure ? LOF.  Pt denies bleeding or pain on arrival.  States she has been leaking since yesterday, thinks it might be urine.

## 2013-12-24 NOTE — ED Notes (Signed)
Pt reports mid and lower abdominal pain starting yesterday am. Reports urinary urgency and feeling like she needs to defecate. Reports nausea with the abdominal pain. States that she has had nausea with her other pregnancies.

## 2013-12-24 NOTE — MAU Provider Note (Signed)
Chief Complaint:  Abdominal Pain   [name] [name] is a 33 y.o.  [id number] with IUP at [redacted]w[redacted]d presenting for Abdominal Pain and leaking fluid.   2 days of symptoms. Gradual in onset with increasing severity. The pressure feels like she needs to have a bowel movement. Pain is regular and coming every 8 minutes and lasting for several minutes at a time. 4/10. Better by lying on left side. Also having leakage of small amount of clear fluid for the last 2 days.  States it doesn't feel like urine or vaginal discharge but more like she is "leaking".  No VB. +FM and normal for her (she had previously told the med student decreased but denies this to me)  No fevers, vaginal bleeding, vomiting, or bowel movement changes.   She was originally seen at [facility] where full labs and UA were done. She at that time had irregular contractions on TOCO.  She was transferred to [facility] then to r/o rupture. Upon arrival here she denies any more abdominal pain, stating it has resolved, and again states her baby is moving normally.    Of note, pt has had 5 prior c/s.    Menstrual History: OB History   Grav Para Term Preterm Abortions TAB SAB Ect Mult Living   7 5 5  1  1   5      Patient's last menstrual period was 06/09/2013.      Past Medical History  Diagnosis Date  . Migraine   . Hypertension   . Sickle cell trait   . Depression     PTSD (1997) major depression (2013)  . Pregnant     Past Surgical History  Procedure Laterality Date  . Cesarean section      Family History  Problem Relation Age of Onset  . Hypertension Mother   . Hypertension Father   . Diabetes Father     History  Substance Use Topics  . Smoking status: Never Smoker   . Smokeless tobacco: Not on file  . Alcohol Use: No     Comment: ocassionaly before pregnancy      Allergies  Allergen Reactions  . Fish Allergy Rash    Any kind of seafood  . Penicillins Swelling and Rash    No prescriptions prior to admission     Review of Systems - Negative except for what is mentioned in HPI.  Physical Exam  Blood pressure 122/65, pulse 119, temperature 98.2 F (36.8 C), temperature source Oral, resp. rate 20, last menstrual period 06/09/2013, SpO2 100.00%. GENERAL: Well-developed, well-nourished female in no acute distress.  LUNGS: Clear to auscultation bilaterally.  HEART: Regular rate and rhythm. ABDOMEN: Soft, nontender, nondistended, gravid. SSE: normal external genitalia, normal vagina. No pooling at rest or with cough. Negative fern. Normal cervix and uterus per exam by Venia Carbon, NP.  SVE: closed, thick, high    EXTREMITIES: Nontender, no edema, 2+ distal pulses.    FHT:  Baseline rate 155 bpm   Variability moderate  Accelerations present   Decelerations occasional variable Contractions: irregular, several periods of time where they were q3-33min and then resolved.    Labs: Results for orders placed during the hospital encounter of 12/24/13 (from the past 24 hour(s))  CBC WITH DIFFERENTIAL   Collection Time    12/24/13 12:35 PM      Result Value Range   WBC 9.8  4.0 - 10.5 K/uL   RBC 4.49  3.87 - 5.11 MIL/uL   Hemoglobin 12.6  12.0 -  15.0 g/dL   HCT 16.136.4  09.636.0 - 04.546.0 %   MCV 81.1  78.0 - 100.0 fL   MCH 28.1  26.0 - 34.0 pg   MCHC 34.6  30.0 - 36.0 g/dL   RDW 40.913.7  81.111.5 - 91.415.5 %   Platelets 275  150 - 400 K/uL   Neutrophils Relative % 78 (*) 43 - 77 %   Neutro Abs 7.6  1.7 - 7.7 K/uL   Lymphocytes Relative 14  12 - 46 %   Lymphs Abs 1.3  0.7 - 4.0 K/uL   Monocytes Relative 8  3 - 12 %   Monocytes Absolute 0.8  0.1 - 1.0 K/uL   Eosinophils Relative 1  0 - 5 %   Eosinophils Absolute 0.1  0.0 - 0.7 K/uL   Basophils Relative 0  0 - 1 %   Basophils Absolute 0.0  0.0 - 0.1 K/uL  COMPREHENSIVE METABOLIC PANEL   Collection Time    12/24/13 12:35 PM      Result Value Range   Sodium 139  137 - 147 mEq/L   Potassium 4.1  3.7 - 5.3 mEq/L   Chloride 103  96 - 112 mEq/L   CO2 22  19 - 32  mEq/L   Glucose, Bld 71  70 - 99 mg/dL   BUN 6  6 - 23 mg/dL   Creatinine, Ser 7.820.51  0.50 - 1.10 mg/dL   Calcium 9.1  8.4 - 95.610.5 mg/dL   Total Protein 7.8  6.0 - 8.3 g/dL   Albumin 3.1 (*) 3.5 - 5.2 g/dL   AST 21  0 - 37 U/L   ALT 13  0 - 35 U/L   Alkaline Phosphatase 84  39 - 117 U/L   Total Bilirubin <0.2 (*) 0.3 - 1.2 mg/dL   GFR calc non Af Amer >90  >90 mL/min   GFR calc Af Amer >90  >90 mL/min  URINALYSIS, ROUTINE W REFLEX MICROSCOPIC   Collection Time    12/24/13 12:42 PM      Result Value Range   Color, Urine YELLOW  YELLOW   APPearance CLEAR  CLEAR   Specific Gravity, Urine 1.013  1.005 - 1.030   pH 6.0  5.0 - 8.0   Glucose, UA NEGATIVE  NEGATIVE mg/dL   Hgb urine dipstick NEGATIVE  NEGATIVE   Bilirubin Urine NEGATIVE  NEGATIVE   Ketones, ur NEGATIVE  NEGATIVE mg/dL   Protein, ur NEGATIVE  NEGATIVE mg/dL   Urobilinogen, UA 0.2  0.0 - 1.0 mg/dL   Nitrite NEGATIVE  NEGATIVE   Leukocytes, UA NEGATIVE  NEGATIVE  POCT FERN TEST   Collection Time    12/24/13  4:59 PM      Result Value Range   POCT Fern Test Negative = intact amniotic membranes    WET PREP, GENITAL   Collection Time    12/24/13  5:25 PM      Result Value Range   Yeast Wet Prep HPF POC NONE SEEN  NONE SEEN   Trich, Wet Prep NONE SEEN  NONE SEEN   Clue Cells Wet Prep HPF POC NONE SEEN  NONE SEEN   WBC, Wet Prep HPF POC NONE SEEN  NONE SEEN    Imaging Studies:  No results found.  Assessment: [name] [name] is  33 y.o. [phone] at 8651w2d presents with Abdominal Pain Abdominal pain resolved by time of exam here at [facility]. Suspect more related to MSK etiology or possible round ligament pain. Vaginal  discharge less concerning on story for ROM. Likely physiologic.   Of note, care in MAU was shared between Venia Carbon, NP and myself.    Plan: 1) abdominal pain - resolved prior to exam - cervix closed and high - labs un[facility]al[facility] from [facility] - reassurance given - return precautions discussed  2) r/o  ROM - neg fern and pool - wet prep neg - reassurance given   3) ctx on TOCO - not felt by pt  - given a dose of procardia in MAU with improvement on TOCO but no change for pt - likely more just braxton-hicks - FFN not done due to cervical exam and recent intercourse  4) FWB - appropriate for GA. Accels pressent, occasional random variable decels but rare.   5) f/u as scheduled in 3 days in clinic - d/c home today.    Rosana Farnell L 1/12/20158:53 PM

## 2013-12-24 NOTE — ED Notes (Signed)
Pt reports last night she thought she wet her pants. Reports approx [redacted] weeks pregnant. Pressure and pain sensation that began around midnight last night. States continuous leakage of fluid. Denies fever. Pt awake, alert, VSS.

## 2013-12-24 NOTE — Progress Notes (Signed)
Talked with Dr. Reola CalkinsBeck. Dr. Shawnie PonsPratt not advailable at this time. I told her that the pt presents with c/o lower abd pain, leaking of fluid at times, no vaginal bleeding. Fhr is 150, mod variability, 10x10 accels no decels. Pt has been on the fetal monitor for about 20-25 min and has had 1 uc. I told her that she is a G7P5, all C/S. She has a hx of PIH with 1st pregnancy and a hx of PP Hemorraghe. States she will notify Dr. Shawnie PonsPratt.

## 2013-12-24 NOTE — ED Notes (Signed)
Dr.Walden at the bedside.  

## 2013-12-24 NOTE — Progress Notes (Signed)
Spoke with Dr. Shawnie PonsPratt and told her that the pt has started contracting irregularly and  The fhr tracing has variables with some of the uc's. Pt has IV fluids and is receiving a bolus. [facility] on the way.

## 2013-12-24 NOTE — Progress Notes (Signed)
Report given to MAU RN .  

## 2013-12-25 LAB — URINE CULTURE

## 2013-12-25 NOTE — MAU Provider Note (Signed)
Attestation of Attending Supervision of Advanced Practitioner (PA/CNM/NP): Evaluation and management procedures were performed by the Advanced Practitioner under my supervision and collaboration.  I have reviewed the Advanced Practitioner's note and chart, and I agree with the management and plan.  Annica Marinello S, MD [facility] Attending 12/25/2013 12:49 AM   

## 2013-12-25 NOTE — ED Provider Notes (Signed)
I saw and evaluated the patient, reviewed the resident's note and I agree with the findings and plan.  EKG Interpretation    Date/Time:    Ventricular Rate:    PR Interval:    QRS Duration:   QT Interval:    QTC Calculation:   R Axis:     Text Interpretation:              33-year-old female here with lower abdominal pain gush of fluids. She is [redacted] weeks pregnant. Rapid response OB nurse at the bedside. There is concern for possible premature rupture of membranes. Patient relaxing calmly. Baby appears well on the monitor with fetal heart rate in the 150s. No evidence of contractions on tocometry. Patient sent to maternal admissions unit for rule out rupture of membranes.  Dagmar HaitWilliam Kassem Kibbe, MD 12/25/13 [phone]

## 2013-12-27 ENCOUNTER — Encounter: Payer: Self-pay | Admitting: Family Medicine

## 2013-12-27 ENCOUNTER — Ambulatory Visit (INDEPENDENT_AMBULATORY_CARE_PROVIDER_SITE_OTHER): Payer: Medicaid Other | Admitting: Family Medicine

## 2013-12-27 ENCOUNTER — Encounter: Payer: Medicaid Other | Admitting: Family Medicine

## 2013-12-27 VITALS — BP 132/87 | Temp 98.9°F | Wt 198.8 lb

## 2013-12-27 DIAGNOSIS — Z3480 Encounter for supervision of other normal pregnancy, unspecified trimester: Secondary | ICD-10-CM

## 2013-12-27 DIAGNOSIS — Z23 Encounter for immunization: Secondary | ICD-10-CM

## 2013-12-27 DIAGNOSIS — O34219 Maternal care for unspecified type scar from previous cesarean delivery: Secondary | ICD-10-CM

## 2013-12-27 LAB — POCT URINALYSIS DIP (DEVICE)
Bilirubin Urine: NEGATIVE
Glucose, UA: NEGATIVE mg/dL
Hgb urine dipstick: NEGATIVE
Ketones, ur: NEGATIVE mg/dL
Leukocytes, UA: NEGATIVE
Nitrite: NEGATIVE
Protein, ur: NEGATIVE mg/dL
Specific Gravity, Urine: 1.015 (ref 1.005–1.030)
Urobilinogen, UA: 0.2 mg/dL (ref 0.0–1.0)
pH: 6 (ref 5.0–8.0)

## 2013-12-27 MED ORDER — PROMETHAZINE HCL 25 MG PO TABS: 25.0000 mg | ORAL_TABLET | Freq: Four times a day (QID) | ORAL | Status: DC | PRN

## 2013-12-27 NOTE — Patient Instructions (Signed)
Breastfeeding Deciding to breastfeed is one of the best choices you can make for you and your baby. A change in hormones during pregnancy causes your breast tissue to grow and increases the number and size of your milk ducts. These hormones also allow proteins, sugars, and fats from your blood supply to make breast milk in your milk-producing glands. Hormones prevent breast milk from being released before your baby is born as well as prompt milk flow after birth. Once breastfeeding has begun, thoughts of your baby, as well as his or her sucking or crying, can stimulate the release of milk from your milk-producing glands.  BENEFITS OF BREASTFEEDING For Your Baby  Your first milk (colostrum) helps your baby's digestive system function better.   There are antibodies in your milk that help your baby fight off infections.   Your baby has a lower incidence of asthma, allergies, and sudden infant death syndrome.   The nutrients in breast milk are better for your baby than infant formulas and are designed uniquely for your baby's needs.   Breast milk improves your baby's brain development.   Your baby is less likely to develop other conditions, such as childhood obesity, asthma, or type 2 diabetes mellitus.  For You   Breastfeeding helps to create a very special bond between you and your baby.   Breastfeeding is convenient. Breast milk is always available at the correct temperature and costs nothing.   Breastfeeding helps to burn calories and helps you lose the weight gained during pregnancy.   Breastfeeding makes your uterus contract to its prepregnancy size faster and slows bleeding (lochia) after you give birth.   Breastfeeding helps to lower your risk of developing type 2 diabetes mellitus, osteoporosis, and breast or ovarian cancer later in life. SIGNS THAT YOUR BABY IS HUNGRY Early Signs of Hunger  Increased alertness or activity.  Stretching.  Movement of the head from  side to side.  Movement of the head and opening of the mouth when the corner of the mouth or cheek is stroked (rooting).  Increased sucking sounds, smacking lips, cooing, sighing, or squeaking.  Hand-to-mouth movements.  Increased sucking of fingers or hands. Late Signs of Hunger  Fussing.  Intermittent crying. Extreme Signs of Hunger Signs of extreme hunger will require calming and consoling before your baby will be able to breastfeed successfully. Do not wait for the following signs of extreme hunger to occur before you initiate breastfeeding:   Restlessness.  A loud, strong cry.   Screaming. BREASTFEEDING BASICS Breastfeeding Initiation  Find a comfortable place to sit or lie down, with your neck and back well supported.  Place a pillow or rolled up blanket under your baby to bring him or her to the level of your breast (if you are seated). Nursing pillows are specially designed to help support your arms and your baby while you breastfeed.  Make sure that your baby's abdomen is facing your abdomen.   Gently massage your breast. With your fingertips, massage from your chest wall toward your nipple in a circular motion. This encourages milk flow. You may need to continue this action during the feeding if your milk flows slowly.  Support your breast with 4 fingers underneath and your thumb above your nipple. Make sure your fingers are well away from your nipple and your baby's mouth.   Stroke your baby's lips gently with your finger or nipple.   When your baby's mouth is open wide enough, quickly bring your baby to your   breast, placing your entire nipple and as much of the colored area around your nipple (areola) as possible into your baby's mouth.   More areola should be visible above your baby's upper lip than below the lower lip.   Your baby's tongue should be between his or her lower gum and your breast.   Ensure that your baby's mouth is correctly positioned  around your nipple (latched). Your baby's lips should create a seal on your breast and be turned out (everted).  It is common for your baby to suck about 2 3 minutes in order to start the flow of breast milk. Latching Teaching your baby how to latch on to your breast properly is very important. An improper latch can cause nipple pain and decreased milk supply for you and poor weight gain in your baby. Also, if your baby is not latched onto your nipple properly, he or she may swallow some air during feeding. This can make your baby fussy. Burping your baby when you switch breasts during the feeding can help to get rid of the air. However, teaching your baby to latch on properly is still the best way to prevent fussiness from swallowing air while breastfeeding. Signs that your baby has successfully latched on to your nipple:    Silent tugging or silent sucking, without causing you pain.   Swallowing heard between every 3 4 sucks.    Muscle movement above and in front of his or her ears while sucking.  Signs that your baby has not successfully latched on to nipple:   Sucking sounds or smacking sounds from your baby while breastfeeding.  Nipple pain. If you think your baby has not latched on correctly, slip your finger into the corner of your baby's mouth to break the suction and place it between your baby's gums. Attempt breastfeeding initiation again. Signs of Successful Breastfeeding Signs from your baby:   A gradual decrease in the number of sucks or complete cessation of sucking.   Falling asleep.   Relaxation of his or her body.   Retention of a small amount of milk in his or her mouth.   Letting go of your breast by himself or herself. Signs from you:  Breasts that have increased in firmness, weight, and size 1 3 hours after feeding.   Breasts that are softer immediately after breastfeeding.  Increased milk volume, as well as a change in milk consistency and color by  the 5th day of breastfeeding.   Nipples that are not sore, cracked, or bleeding. Signs That Your Baby is Getting Enough Milk  Wetting at least 3 diapers in a 24-hour period. The urine should be clear and pale yellow by age 5 days.  At least 3 stools in a 24-hour period by age 5 days. The stool should be soft and yellow.  At least 3 stools in a 24-hour period by age 7 days. The stool should be seedy and yellow.  No loss of weight greater than 10% of birth weight during the first 3 days of age.  Average weight gain of 4 7 ounces (120 210 mL) per week after age 4 days.  Consistent daily weight gain by age 5 days, without weight loss after the age of 2 weeks. After a feeding, your baby may spit up a small amount. This is common. BREASTFEEDING FREQUENCY AND DURATION Frequent feeding will help you make more milk and can prevent sore nipples and breast engorgement. Breastfeed when you feel the need to reduce   the fullness of your breasts or when your baby shows signs of hunger. This is called "breastfeeding on demand." Avoid introducing a pacifier to your baby while you are working to establish breastfeeding (the first 4 6 weeks after your baby is born). After this time you may choose to use a pacifier. Research has shown that pacifier use during the first year of a baby's life decreases the risk of sudden infant death syndrome (SIDS). Allow your baby to feed on each breast as long as he or she wants. Breastfeed until your baby is finished feeding. When your baby unlatches or falls asleep while feeding from the first breast, offer the second breast. Because newborns are often sleepy in the first few weeks of life, you may need to awaken your baby to get him or her to feed. Breastfeeding times will vary from baby to baby. However, the following rules can serve as a guide to help you ensure that your baby is properly fed:  Newborns (babies 4 weeks of age or younger) may breastfeed every 1 3  hours.  Newborns should not go longer than 3 hours during the day or 5 hours during the night without breastfeeding.  You should breastfeed your baby a minimum of 8 times in a 24-hour period until you begin to introduce solid foods to your baby at around 6 months of age. BREAST MILK PUMPING Pumping and storing breast milk allows you to ensure that your baby is exclusively fed your breast milk, even at times when you are unable to breastfeed. This is especially important if you are going back to work while you are still breastfeeding or when you are not able to be present during feedings. Your lactation consultant can give you guidelines on how long it is safe to store breast milk.  A breast pump is a machine that allows you to pump milk from your breast into a sterile bottle. The pumped breast milk can then be stored in a refrigerator or freezer. Some breast pumps are operated by hand, while others use electricity. Ask your lactation consultant which type will work best for you. Breast pumps can be purchased, but some hospitals and breastfeeding support groups lease breast pumps on a monthly basis. A lactation consultant can teach you how to hand express breast milk, if you prefer not to use a pump.  CARING FOR YOUR BREASTS WHILE YOU BREASTFEED Nipples can become dry, cracked, and sore while breastfeeding. The following recommendations can help keep your breasts moisturized and healthy:  Avoid using soap on your nipples.   Wear a supportive bra. Although not required, special nursing bras and tank tops are designed to allow access to your breasts for breastfeeding without taking off your entire bra or top. Avoid wearing underwire style bras or extremely tight bras.  Air dry your nipples for 3 4minutes after each feeding.   Use only cotton bra pads to absorb leaked breast milk. Leaking of breast milk between feedings is normal.   Use lanolin on your nipples after breastfeeding. Lanolin helps to  maintain your skin's normal moisture barrier. If you use pure lanolin you do not need to wash it off before feeding your baby again. Pure lanolin is not toxic to your baby. You may also hand express a few drops of breast milk and gently massage that milk into your nipples and allow the milk to air dry. In the first few weeks after giving birth, some women experience extremely full breasts (engorgement). Engorgement can make   your breasts feel heavy, warm, and tender to the touch. Engorgement peaks within 3 5 days after you give birth. The following recommendations can help ease engorgement:  Completely empty your breasts while breastfeeding or pumping. You may want to start by applying warm, moist heat (in the shower or with warm water-soaked hand towels) just before feeding or pumping. This increases circulation and helps the milk flow. If your baby does not completely empty your breasts while breastfeeding, pump any extra milk after he or she is finished.  Wear a snug bra (nursing or regular) or tank top for 1 2 days to signal your body to slightly decrease milk production.  Apply ice packs to your breasts, unless this is too uncomfortable for you.  Make sure that your baby is latched on and positioned properly while breastfeeding. If engorgement persists after 48 hours of following these recommendations, contact your health care provider or a lactation consultant. OVERALL HEALTH CARE RECOMMENDATIONS WHILE BREASTFEEDING  Eat healthy foods. Alternate between meals and snacks, eating 3 of each per day. Because what you eat affects your breast milk, some of the foods may make your baby more irritable than usual. Avoid eating these foods if you are sure that they are negatively affecting your baby.  Drink milk, fruit juice, and water to satisfy your thirst (about 10 glasses a day).   Rest often, relax, and continue to take your prenatal vitamins to prevent fatigue, stress, and anemia.  Continue  breast self-awareness checks.  Avoid chewing and smoking tobacco.  Avoid alcohol and drug use. Some medicines that may be harmful to your baby can pass through breast milk. It is important to ask your health care provider before taking any medicine, including all over-the-counter and prescription medicine as well as vitamin and herbal supplements. It is possible to become pregnant while breastfeeding. If birth control is desired, ask your health care provider about options that will be safe for your baby. SEEK MEDICAL CARE IF:   You feel like you want to stop breastfeeding or have become frustrated with breastfeeding.  You have painful breasts or nipples.  Your nipples are cracked or bleeding.  Your breasts are red, tender, or warm.  You have a swollen area on either breast.  You have a fever or chills.  You have nausea or vomiting.  You have drainage other than breast milk from your nipples.  Your breasts do not become full before feedings by the 5th day after you give birth.  You feel sad and depressed.  Your baby is too sleepy to eat well.  Your baby is having trouble sleeping.   Your baby is wetting less than 3 diapers in a 24-hour period.  Your baby has less than 3 stools in a 24-hour period.  Your baby's skin or the white part of his or her eyes becomes yellow.   Your baby is not gaining weight by 5 days of age. SEEK IMMEDIATE MEDICAL CARE IF:   Your baby is overly tired (lethargic) and does not want to wake up and feed.  Your baby develops an unexplained fever. Document Released: 11/29/2005 Document Revised: 08/01/2013 Document Reviewed: 05/23/2013 [employer] Patient Information 2014 [employer].  

## 2013-12-27 NOTE — Progress Notes (Signed)
Pulse- 101 Patient states she can't drink glucola, is going to come back to do 28 week labs with jelly beans

## 2013-12-27 NOTE — Progress Notes (Signed)
Needs Jelly bean test TDaP today BTL papers today--C-s scheduled today F/u U/s to complete anatomy

## 2013-12-31 ENCOUNTER — Ambulatory Visit (HOSPITAL_COMMUNITY)
Admission: RE | Admit: 2013-12-31 | Discharge: 2013-12-31 | Disposition: A | Discharge status: Home / Self Care | Payer: Medicaid Other | Source: Ambulatory Visit | Attending: Family Medicine | Admitting: Family Medicine

## 2013-12-31 DIAGNOSIS — Z3689 Encounter for other specified antenatal screening: Secondary | ICD-10-CM | POA: Insufficient documentation

## 2013-12-31 DIAGNOSIS — O34219 Maternal care for unspecified type scar from previous cesarean delivery: Secondary | ICD-10-CM | POA: Insufficient documentation

## 2014-01-07 ENCOUNTER — Emergency Department (HOSPITAL_COMMUNITY)
Admission: EM | Admit: 2014-01-07 | Discharge: 2014-01-07 | Disposition: A | Discharge status: Home / Self Care | Payer: Medicaid Other | Attending: Emergency Medicine | Admitting: Emergency Medicine

## 2014-01-07 ENCOUNTER — Encounter (HOSPITAL_COMMUNITY): Payer: Self-pay | Admitting: Emergency Medicine

## 2014-01-07 DIAGNOSIS — Z8679 Personal history of other diseases of the circulatory system: Secondary | ICD-10-CM | POA: Insufficient documentation

## 2014-01-07 DIAGNOSIS — R42 Dizziness and giddiness: Secondary | ICD-10-CM | POA: Insufficient documentation

## 2014-01-07 DIAGNOSIS — O99340 Other mental disorders complicating pregnancy, unspecified trimester: Secondary | ICD-10-CM | POA: Insufficient documentation

## 2014-01-07 DIAGNOSIS — D573 Sickle-cell trait: Secondary | ICD-10-CM | POA: Insufficient documentation

## 2014-01-07 DIAGNOSIS — Z880 Allergy status to penicillin: Secondary | ICD-10-CM | POA: Insufficient documentation

## 2014-01-07 DIAGNOSIS — O9989 Other specified diseases and conditions complicating pregnancy, childbirth and the puerperium: Secondary | ICD-10-CM | POA: Insufficient documentation

## 2014-01-07 DIAGNOSIS — H538 Other visual disturbances: Secondary | ICD-10-CM | POA: Insufficient documentation

## 2014-01-07 DIAGNOSIS — R079 Chest pain, unspecified: Secondary | ICD-10-CM

## 2014-01-07 DIAGNOSIS — E860 Dehydration: Secondary | ICD-10-CM | POA: Insufficient documentation

## 2014-01-07 DIAGNOSIS — R0602 Shortness of breath: Secondary | ICD-10-CM | POA: Insufficient documentation

## 2014-01-07 DIAGNOSIS — F411 Generalized anxiety disorder: Secondary | ICD-10-CM | POA: Insufficient documentation

## 2014-01-07 DIAGNOSIS — O169 Unspecified maternal hypertension, unspecified trimester: Secondary | ICD-10-CM | POA: Insufficient documentation

## 2014-01-07 LAB — BASIC METABOLIC PANEL
BUN: 4 mg/dL — ABNORMAL LOW (ref 6–23)
CALCIUM: 8.8 mg/dL (ref 8.4–10.5)
CO2: 22 meq/L (ref 19–32)
Chloride: 104 mEq/L (ref 96–112)
Creatinine, Ser: 0.58 mg/dL (ref 0.50–1.10)
GFR calc Af Amer: >90 mL/min (ref >90)
Glucose, Bld: 79 mg/dL (ref 70–99)
Potassium: 3.5 mEq/L — ABNORMAL LOW (ref 3.7–5.3)
Sodium: 138 mEq/L (ref 137–147)

## 2014-01-07 LAB — URINALYSIS, ROUTINE W REFLEX MICROSCOPIC
Bilirubin Urine: NEGATIVE
Glucose, UA: NEGATIVE mg/dL
Hgb urine dipstick: NEGATIVE
Ketones, ur: NEGATIVE mg/dL
LEUKOCYTES UA: NEGATIVE
Nitrite: NEGATIVE
PROTEIN: NEGATIVE mg/dL
Specific Gravity, Urine: 1.008 (ref 1.005–1.030)
UROBILINOGEN UA: 0.2 mg/dL (ref 0.0–1.0)
pH: 6.5 (ref 5.0–8.0)

## 2014-01-07 LAB — CBC
HCT: 33.9 % — ABNORMAL LOW (ref 36.0–46.0)
Hemoglobin: 11.8 g/dL — ABNORMAL LOW (ref 12.0–15.0)
MCH: 27.8 pg (ref 26.0–34.0)
MCHC: 34.8 g/dL (ref 30.0–36.0)
MCV: 80 fL (ref 78.0–100.0)
PLATELETS: 261 10*3/uL (ref 150–400)
RBC: 4.24 MIL/uL (ref 3.87–5.11)
RDW: 13.7 % (ref 11.5–15.5)
WBC: 8.2 10*3/uL (ref 4.0–10.5)

## 2014-01-07 LAB — POCT I-STAT TROPONIN I: TROPONIN I, POC: 0 ng/mL (ref 0.00–0.08)

## 2014-01-07 MED ORDER — SODIUM CHLORIDE 0.9 % IV BOLUS (SEPSIS): 1000.0000 mL | Freq: Once | INTRAVENOUS | Status: DC

## 2014-01-07 NOTE — ED Provider Notes (Signed)
CSN: [id number]     Arrival date & time 01/07/14  1449 History   First MD Initiated Contact with Patient 01/07/14 1501     Chief Complaint  Patient presents with  . Chest Pain  . Dizziness    HPI: [name] [name] is a 33 yo F, G7P6, currently at [redacted] weeks gestation, who presents with dizziness and chest discomfort. She was driving home from school when she began to have blurred vision and lightheadedness. She began to feel anxious and noted chest discomfort located just distal to suprasternal notch. Described as "hard," lasted for 7 minutes then resolved. She has not had recurrent pain. It was not pleuritic or exertional in nature. Minimal SOB with episode of chest pain that has now resolved. She drove to her mother's house and called 911. On arrival she only complains of lightheadedness. No abdominal pain, vomiting, diarrhea, leakage of fluid, vaginal bleeding or back pain. She does feel her baby moving normally. She has no history of VTE. No history of CAD. She denies headache as well.    Past Medical History  Diagnosis Date  . Migraine   . Hypertension   . Sickle cell trait   . Depression     PTSD (1997) major depression (2013)  . Pregnant    Past Surgical History  Procedure Laterality Date  . Cesarean section     Family History  Problem Relation Age of Onset  . Hypertension Mother   . Hypertension Father   . Diabetes Father    History  Substance Use Topics  . Smoking status: Never Smoker   . Smokeless tobacco: Not on file  . Alcohol Use: No     Comment: ocassionaly before pregnancy   OB History   Grav Para Term Preterm Abortions TAB SAB Ect Mult Living   7 5 5  1  1   5      Review of Systems  Constitutional: Negative for fever, chills, appetite change and fatigue.  Eyes: Positive for visual disturbance (blurred vision with CP, now resolved). Negative for photophobia.  Respiratory: Positive for shortness of breath. Negative for cough.   Cardiovascular: Positive for chest  pain. Negative for leg swelling.  Gastrointestinal: Negative for nausea, vomiting, abdominal pain, diarrhea and constipation.  Genitourinary: Negative for dysuria, frequency and decreased urine volume.  Musculoskeletal: Negative for arthralgias, back pain, gait problem and myalgias.  Skin: Negative for color change and wound.  Neurological: Positive for light-headedness. Negative for dizziness, syncope and headaches.  Psychiatric/Behavioral: Negative for confusion and agitation.  All other systems reviewed and are negative.    Allergies  Fish allergy and Penicillins  Home Medications   Current Outpatient Rx  Name  Route  Sig  Dispense  Refill  . butalbital-acetaminophen-caffeine (FIORICET) 50-325-40 MG per tablet   Oral   Take 1-2 tablets by mouth every 6 (six) hours as needed for headache.   20 tablet   0   . Prenatal Vit-Fe Fumarate-FA (PRENATAL MULTIVITAMIN) TABS tablet   Oral   Take 1 tablet by mouth daily at 12 noon.         . promethazine (PHENERGAN) 25 MG tablet   Oral   Take 1 tablet (25 mg total) by mouth every 6 (six) hours as needed for nausea.   30 tablet   2    BP 127/80  Temp(Src) 98.1 F (36.7 C) (Oral)  Resp 18  Ht 5\' 4"  (1.626 m)  Wt 209 lb (94.802 kg)  BMI 35.86 kg/m2  SpO2  99%  LMP 06/09/2013 Physical Exam  Nursing note and vitals reviewed. Constitutional: She is oriented to person, place, and time. She appears well-developed. No distress.  HENT:  Head: Normocephalic and atraumatic.  Mouth/Throat: Mucous membranes are dry.  Eyes: Conjunctivae and EOM are normal. Pupils are equal, round, and reactive to light.  Neck: Normal range of motion. Neck supple.  Cardiovascular: Normal rate, regular rhythm, normal heart sounds and intact distal pulses.   Pulmonary/Chest: Effort normal and breath sounds normal. No respiratory distress.  Abdominal: Soft. Bowel sounds are normal. There is no tenderness. There is no rebound and no guarding.  Gravid  abdomen, non tender.   Musculoskeletal: Normal range of motion. She exhibits no edema and no tenderness.  Legs appear symmetric, no significant swelling  Neurological: She is alert and oriented to person, place, and time. No cranial nerve deficit. Coordination normal.  Skin: Skin is warm and dry. No rash noted.  Psychiatric: She has a normal mood and affect. Her behavior is normal.    ED Course  Procedures (including critical care time) Labs Review Labs Reviewed  CBC - Abnormal; Notable for the following:    Hemoglobin 11.8 (*)    HCT 33.9 (*)    All other components within normal limits  BASIC METABOLIC PANEL - Abnormal; Notable for the following:    Potassium 3.5 (*)    BUN 4 (*)    All other components within normal limits  URINALYSIS, ROUTINE W REFLEX MICROSCOPIC  POCT I-STAT TROPONIN I   Imaging Review No results found.  EKG Interpretation    Date/Time:  Monday January 07 2014 15:02:33 EST Ventricular Rate:  99 PR Interval:  103 QRS Duration: 88 QT Interval:  349 QTC Calculation: 448 R Axis:   26 Text Interpretation:  Sinus rhythm Non-specific ST-t changes No significant change since last tracing Confirmed by Bebe ShaggyWICKLINE  MD, DONALD [phone]([id number]) on 01/07/2014 3:27:29 PM            MDM   33 yo F, G7P6, currently at [redacted] weeks gestation, who presents with dizziness and chest discomfort. She is well appearing, mildly tachycardic. Symptoms likely due to dehydration. She no longer has chest discomfort, lungs clear, not hypoxic, no asymmetric leg swelling, doubt PE. ECG shows SR without evidence of acute ischemia, does have likely LVH which is unchanged from previous tracings. UA without evidence of infection. Troponin obtained in triage and negative, doubt ACS given lack of risk factors. CBC and BMP without significant abnormalities. OB RN evaluated patient, FHT's normal with good variability. No contractions. I recommended IVF's given tachycardia and dizziness but she declined. She  drank two glasses of water in the ED with improvement of her symptoms. Given she is well appearing, fetal well being reassuring, no pregnancy complaints and other symptoms now resolved, felt she could be DC'd home. Advised her to stay hydrated with water. Return for new or worsening symptoms. She was in agreement with plan.   Reviewed labs, ECG and previous medical records, utilized in MDM  Discussed case with Dr. Bebe ShaggyWickline.   Clinical Impression 1. Dehydration     [name] BilletMathias Sarh Kirschenbaum, MD 01/08/14 1958

## 2014-01-07 NOTE — ED Notes (Addendum)
Pt states she has been feeling the baby move less lately and has blurred vision. Denies any dizziness at present

## 2014-01-07 NOTE — ED Notes (Signed)
[name] [name], [facility] at the bedside at this time.

## 2014-01-07 NOTE — ED Provider Notes (Signed)
Patient seen/examined in the Emergency Department in conjunction with Resident Physician Provider Freida BusmanAllen Patient reports chest pain Exam : awake/alert, no distress noted, no loud murmurs noted.   Plan: pt denied pleuritic CP.  She is well appearing Low suspicion for ACS/Pe/Dissection/cardiomyopathy She is pregnant, but has been cleared by [facility]   Hilda Wexler W Kellye Mizner, MD 01/07/14 [phone]

## 2014-01-07 NOTE — ED Notes (Signed)
Dr. Allen in to see patient.

## 2014-01-07 NOTE — Progress Notes (Signed)
Spoke with Dr. Marice Potterove. I told her that the pt is 312/[redacted] weeks pregnant, G7P5, All c/s deliveries. Pt is scheduled for a C/S on March 28th. Pt c/o chest  pressure and dizziness. ED MD states her EKG is unchanged from previous EKG and that the pt is dehydrated. fhr baseline is 150bpm, min-mod variability, accels no decels, no uc's, no vag bleeding or leaking of fluid. Pt is obstetrically cleared to be dc home.

## 2014-01-07 NOTE — ED Notes (Signed)
Per [facility], pt was driving home from school and started having chest pain and feeling dizzy. States she was a little lightheaded before getting in her car. After starting to drive started having chest pressure and the dizziness got worse. Made it to her mother's house and called 911 to come get her. Was recently admitted to [facility] for a couple nights.

## 2014-01-07 NOTE — Discharge Instructions (Signed)
Ensure you are drinking water to prevent dehydration. Please call your OB tomorrow. If your symptoms recur, or you develop new symptoms return to the ED>   Chest Pain (Nonspecific) It is often hard to give a specific diagnosis for the cause of chest pain. There is always a chance that your pain could be related to something serious, such as a heart attack or a blood clot in the lungs. You need to follow up with your caregiver for further evaluation. CAUSES   Heartburn.  Pneumonia or bronchitis.  Anxiety or stress.  Inflammation around your heart (pericarditis) or lung (pleuritis or pleurisy).  A blood clot in the lung.  A collapsed lung (pneumothorax). It can develop suddenly on its own (spontaneous pneumothorax) or from injury (trauma) to the chest.  Shingles infection (herpes zoster virus). The chest wall is composed of bones, muscles, and cartilage. Any of these can be the source of the pain.  The bones can be bruised by injury.  The muscles or cartilage can be strained by coughing or overwork.  The cartilage can be affected by inflammation and become sore (costochondritis). DIAGNOSIS  Lab tests or other studies, such as X-rays, electrocardiography, stress testing, or cardiac imaging, may be needed to find the cause of your pain.  TREATMENT   Treatment depends on what may be causing your chest pain. Treatment may include:  Acid blockers for heartburn.  Anti-inflammatory medicine.  Pain medicine for inflammatory conditions.  Antibiotics if an infection is present.  You may be advised to change lifestyle habits. This includes stopping smoking and avoiding alcohol, caffeine, and chocolate.  You may be advised to keep your head raised (elevated) when sleeping. This reduces the chance of acid going backward from your stomach into your esophagus.  Most of the time, nonspecific chest pain will improve within 2 to 3 days with rest and mild pain medicine. HOME CARE INSTRUCTIONS    If antibiotics were prescribed, take your antibiotics as directed. Finish them even if you start to feel better.  For the next few days, avoid physical activities that bring on chest pain. Continue physical activities as directed.  Do not smoke.  Avoid drinking alcohol.  Only take over-the-counter or prescription medicine for pain, discomfort, or fever as directed by your caregiver.  Follow your caregiver's suggestions for further testing if your chest pain does not go away.  Keep any follow-up appointments you made. If you do not go to an appointment, you could develop lasting (chronic) problems with pain. If there is any problem keeping an appointment, you must call to reschedule. SEEK MEDICAL CARE IF:   You think you are having problems from the medicine you are taking. Read your medicine instructions carefully.  Your chest pain does not go away, even after treatment.  You develop a rash with blisters on your chest. SEEK IMMEDIATE MEDICAL CARE IF:   You have increased chest pain or pain that spreads to your arm, neck, jaw, back, or abdomen.  You develop shortness of breath, an increasing cough, or you are coughing up blood.  You have severe back or abdominal pain, feel nauseous, or vomit.  You develop severe weakness, fainting, or chills.  You have a fever. THIS IS AN EMERGENCY. Do not wait to see if the pain will go away. Get medical help at once. Call your local emergency services (911 in [place] Do not drive yourself to the hospital. MAKE SURE YOU:   Understand these instructions.  Will watch your condition.  Will get help right away if you are not doing well or get worse. °Document Released: 09/08/2005 Document Revised: 02/21/2012 Document Reviewed: 07/04/2008 °Exit[employer]® Patient Information ©2014 [employer]. ° °

## 2014-01-07 NOTE — Progress Notes (Signed)
Pt is 31 2/[redacted] weeks pregnant. G7P5. Pt has had all C/S. She says that she is scheduled for a C/S on March 28,2015. Pt says that she had pressure in her chest today and some blurred vision. Denies any pain in her chest or blurred vision at this time. Denies any vaginal bleeding or leaking of fluid. [facility]

## 2014-01-07 NOTE — ED Notes (Signed)
Dr. Wickline at the bedside.  

## 2014-01-10 NOTE — ED Provider Notes (Signed)
I have personally seen and examined the patient.  I have discussed the plan of care with the resident.  I have reviewed the documentation on PMH/FH/Soc. History.  I have reviewed the documentation of the resident and agree.  I have reviewed and agree with the ECG interpretation(s) documented by the resident.   Joya Gaskinsonald W Cheyan Frees, MD 01/10/14 [phone]

## 2014-01-11 ENCOUNTER — Encounter: Payer: Medicaid Other | Admitting: Family Medicine

## 2014-01-23 ENCOUNTER — Ambulatory Visit (INDEPENDENT_AMBULATORY_CARE_PROVIDER_SITE_OTHER): Payer: Medicaid Other | Admitting: Family Medicine

## 2014-01-23 ENCOUNTER — Encounter: Payer: Self-pay | Admitting: Family Medicine

## 2014-01-23 VITALS — BP 123/86 | Temp 97.3°F | Wt 204.6 lb

## 2014-01-23 DIAGNOSIS — F329 Major depressive disorder, single episode, unspecified: Secondary | ICD-10-CM

## 2014-01-23 DIAGNOSIS — Z3480 Encounter for supervision of other normal pregnancy, unspecified trimester: Secondary | ICD-10-CM

## 2014-01-23 DIAGNOSIS — L299 Pruritus, unspecified: Secondary | ICD-10-CM

## 2014-01-23 LAB — POCT URINALYSIS DIP (DEVICE)
Bilirubin Urine: NEGATIVE
Glucose, UA: NEGATIVE mg/dL
Hgb urine dipstick: NEGATIVE
Ketones, ur: NEGATIVE mg/dL
Leukocytes, UA: NEGATIVE
NITRITE: NEGATIVE
PH: 5.5 (ref 5.0–8.0)
PROTEIN: NEGATIVE mg/dL
Specific Gravity, Urine: 1.015 (ref 1.005–1.030)
Urobilinogen, UA: 0.2 mg/dL (ref 0.0–1.0)

## 2014-01-23 MED ORDER — TRIAMCINOLONE ACETONIDE 0.025 % EX OINT: 1.0000 "application " | TOPICAL_OINTMENT | Freq: Two times a day (BID) | CUTANEOUS | Status: DC

## 2014-01-23 MED ORDER — FLUOXETINE HCL 20 MG PO CAPS: 20.0000 mg | ORAL_CAPSULE | Freq: Every day | ORAL | Status: DC

## 2014-01-23 MED ORDER — PROMETHAZINE HCL 25 MG PO TABS: 25.0000 mg | ORAL_TABLET | Freq: Four times a day (QID) | ORAL | Status: DC | PRN

## 2014-01-23 NOTE — Progress Notes (Signed)
+  FM, no lof no vb, no ctx MDD+, start on prozac 20mg  monitor S<D growth scan in 1 week Rash on abdomen - pruritic, - bile acids and triamcinalone crm Sign consent for BTL Never did 1 hr - attempt today.  [name] [name] is a 33 y.o. [id number] at [id number] for ROB visit.  Discussed with Patient:  -Plans to breast feed.  All questions answered. -Continue prenatal vitamins. -Reviewed fetal kick counts Pt to perform daily at a time when the baby is active, lie laterally with both hands on belly in quiet room and count all movements (hiccups, shoulder rolls, obvious kicks, etc); pt is to report to clinic L&D for less than 10 movements felt in a one hour time period-pt told as soon as she counts 10 movements the count is complete.  - Routine precautions discussed (depression, infection s/s).   Patient provided with all pertinent phone numbers for emergencies. - RTC for any VB, regular, painful cramps/ctxs occurring at a rate of >2/10 min, fever (100.5 or higher), n/v/d, any pain that is unresolving or worsening, LOF, decreased fetal movement, CP, SOB, edema - RTC in 2 weeks for next appt. - Did GBS swabs today and will f/u results and call if abnormal.  Contact#:  Pt has no drug allergies, so will get PCN if GBS+ OR will get sensitivities on GBS swab as pt is PCN-allergic.  Problems: Patient Active Problem List   Diagnosis Date Noted  . Sickle cell trait 10/01/2013  . Supervision of normal subsequent pregnancy 09/25/2013  . Previous cesarean delivery, antepartum condition or complication 09/25/2013

## 2014-01-23 NOTE — Progress Notes (Signed)
Pulse: 92

## 2014-01-25 ENCOUNTER — Other Ambulatory Visit: Payer: Medicaid Other

## 2014-01-25 LAB — BILE ACIDS, TOTAL: Bile Acids Total: 8 umol/L (ref 0–19)

## 2014-01-30 ENCOUNTER — Ambulatory Visit (HOSPITAL_COMMUNITY): Admission: RE | Admit: 2014-01-30 | Payer: Medicaid Other | Source: Ambulatory Visit

## 2014-01-31 ENCOUNTER — Encounter: Payer: Medicaid Other | Admitting: Obstetrics and Gynecology

## 2014-02-01 ENCOUNTER — Encounter: Payer: Medicaid Other | Admitting: Family Medicine

## 2014-02-15 ENCOUNTER — Ambulatory Visit (INDEPENDENT_AMBULATORY_CARE_PROVIDER_SITE_OTHER): Payer: Medicaid Other | Admitting: Obstetrics and Gynecology

## 2014-02-15 ENCOUNTER — Encounter: Payer: Self-pay | Admitting: Obstetrics & Gynecology

## 2014-02-15 VITALS — BP 131/88 | Temp 97.1°F | Wt 204.8 lb

## 2014-02-15 DIAGNOSIS — D573 Sickle-cell trait: Secondary | ICD-10-CM

## 2014-02-15 DIAGNOSIS — IMO0002 Reserved for concepts with insufficient information to code with codable children: Secondary | ICD-10-CM

## 2014-02-15 DIAGNOSIS — Z22338 Carrier of other streptococcus: Secondary | ICD-10-CM

## 2014-02-15 DIAGNOSIS — O99891 Other specified diseases and conditions complicating pregnancy: Secondary | ICD-10-CM

## 2014-02-15 DIAGNOSIS — O9989 Other specified diseases and conditions complicating pregnancy, childbirth and the puerperium: Secondary | ICD-10-CM

## 2014-02-15 DIAGNOSIS — Z3480 Encounter for supervision of other normal pregnancy, unspecified trimester: Secondary | ICD-10-CM

## 2014-02-15 DIAGNOSIS — O0930 Supervision of pregnancy with insufficient antenatal care, unspecified trimester: Secondary | ICD-10-CM

## 2014-02-15 LAB — CBC
HCT: 38.4 % (ref 36.0–46.0)
HEMOGLOBIN: 13.2 g/dL (ref 12.0–15.0)
MCH: 27 pg (ref 26.0–34.0)
MCHC: 34.4 g/dL (ref 30.0–36.0)
MCV: 78.7 fL (ref 78.0–100.0)
PLATELETS: 272 10*3/uL (ref 150–400)
RBC: 4.88 MIL/uL (ref 3.87–5.11)
RDW: 15 % (ref 11.5–15.5)
WBC: 7.3 10*3/uL (ref 4.0–10.5)

## 2014-02-15 LAB — POCT URINALYSIS DIP (DEVICE)
Bilirubin Urine: NEGATIVE
Glucose, UA: NEGATIVE mg/dL
Hgb urine dipstick: NEGATIVE
Ketones, ur: NEGATIVE mg/dL
LEUKOCYTES UA: NEGATIVE
NITRITE: NEGATIVE
PH: 5.5 (ref 5.0–8.0)
PROTEIN: NEGATIVE mg/dL
Specific Gravity, Urine: 1.015 (ref 1.005–1.030)
Urobilinogen, UA: 0.2 mg/dL (ref 0.0–1.0)

## 2014-02-15 LAB — GLUCOSE, CAPILLARY: Glucose-Capillary: 80 mg/dL (ref 70–99)

## 2014-02-15 NOTE — Patient Instructions (Signed)
Third Trimester of Pregnancy  The third trimester is from week 29 through week 42, months 7 through 9. The third trimester is a time when the fetus is growing rapidly. At the end of the ninth month, the fetus is about 20 inches in length and weighs 6 10 pounds.   BODY CHANGES  Your body goes through many changes during pregnancy. The changes vary from woman to woman.    Your weight will continue to increase. You can expect to gain 25 35 pounds (11 16 kg) by the end of the pregnancy.   You may begin to get stretch marks on your hips, abdomen, and breasts.   You may urinate more often because the fetus is moving lower into your pelvis and pressing on your bladder.   You may develop or continue to have heartburn as a result of your pregnancy.   You may develop constipation because certain hormones are causing the muscles that push waste through your intestines to slow down.   You may develop hemorrhoids or swollen, bulging veins (varicose veins).   You may have pelvic pain because of the weight gain and pregnancy hormones relaxing your joints between the bones in your pelvis. Back aches may result from over exertion of the muscles supporting your posture.   Your breasts will continue to grow and be tender. A yellow discharge may leak from your breasts called colostrum.   Your belly button may stick out.   You may feel short of breath because of your expanding uterus.   You may notice the fetus "dropping," or moving lower in your abdomen.   You may have a bloody mucus discharge. This usually occurs a few days to a week before labor begins.   Your cervix becomes thin and soft (effaced) near your due date.  WHAT TO EXPECT AT YOUR PRENATAL EXAMS   You will have prenatal exams every 2 weeks until week 36. Then, you will have weekly prenatal exams. During a routine prenatal visit:   You will be weighed to make sure you and the fetus are growing normally.   Your blood pressure is taken.   Your abdomen will be  measured to track your baby's growth.   The fetal heartbeat will be listened to.   Any test results from the previous visit will be discussed.   You may have a cervical check near your due date to see if you have effaced.  At around 36 weeks, your caregiver will check your cervix. At the same time, your caregiver will also perform a test on the secretions of the vaginal tissue. This test is to determine if a type of bacteria, Group B streptococcus, is present. Your caregiver will explain this further.  Your caregiver may ask you:   What your birth plan is.   How you are feeling.   If you are feeling the baby move.   If you have had any abnormal symptoms, such as leaking fluid, bleeding, severe headaches, or abdominal cramping.   If you have any questions.  Other tests or screenings that may be performed during your third trimester include:   Blood tests that check for low iron levels (anemia).   Fetal testing to check the health, activity level, and growth of the fetus. Testing is done if you have certain medical conditions or if there are problems during the pregnancy.  FALSE LABOR  You may feel small, irregular contractions that eventually go away. These are called Braxton Hicks contractions, or   false labor. Contractions may last for hours, days, or even weeks before true labor sets in. If contractions come at regular intervals, intensify, or become painful, it is best to be seen by your caregiver.   SIGNS OF LABOR    Menstrual-like cramps.   Contractions that are 5 minutes apart or less.   Contractions that start on the top of the uterus and spread down to the lower abdomen and back.   A sense of increased pelvic pressure or back pain.   A watery or bloody mucus discharge that comes from the vagina.  If you have any of these signs before the 37th week of pregnancy, call your caregiver right away. You need to go to the hospital to get checked immediately.  HOME CARE INSTRUCTIONS    Avoid all  smoking, herbs, alcohol, and unprescribed drugs. These chemicals affect the formation and growth of the baby.   Follow your caregiver's instructions regarding medicine use. There are medicines that are either safe or unsafe to take during pregnancy.   Exercise only as directed by your caregiver. Experiencing uterine cramps is a good sign to stop exercising.   Continue to eat regular, healthy meals.   Wear a good support bra for breast tenderness.   Do not use hot tubs, steam rooms, or saunas.   Wear your seat belt at all times when driving.   Avoid raw meat, uncooked cheese, cat litter boxes, and soil used by cats. These carry germs that can cause birth defects in the baby.   Take your prenatal vitamins.   Try taking a stool softener (if your caregiver approves) if you develop constipation. Eat more high-fiber foods, such as fresh vegetables or fruit and whole grains. Drink plenty of fluids to keep your urine clear or pale yellow.   Take warm sitz baths to soothe any pain or discomfort caused by hemorrhoids. Use hemorrhoid cream if your caregiver approves.   If you develop varicose veins, wear support hose. Elevate your feet for 15 minutes, 3 4 times a day. Limit salt in your diet.   Avoid heavy lifting, wear low heal shoes, and practice good posture.   Rest a lot with your legs elevated if you have leg cramps or low back pain.   Visit your dentist if you have not gone during your pregnancy. Use a soft toothbrush to brush your teeth and be gentle when you floss.   A sexual relationship may be continued unless your caregiver directs you otherwise.   Do not travel far distances unless it is absolutely necessary and only with the approval of your caregiver.   Take prenatal classes to understand, practice, and ask questions about the labor and delivery.   Make a trial run to the hospital.   Pack your hospital bag.   Prepare the baby's nursery.   Continue to go to all your prenatal visits as directed  by your caregiver.  SEEK MEDICAL CARE IF:   You are unsure if you are in labor or if your water has broken.   You have dizziness.   You have mild pelvic cramps, pelvic pressure, or nagging pain in your abdominal area.   You have persistent nausea, vomiting, or diarrhea.   You have a bad smelling vaginal discharge.   You have pain with urination.  SEEK IMMEDIATE MEDICAL CARE IF:    You have a fever.   You are leaking fluid from your vagina.   You have spotting or bleeding from your vagina.     You have severe abdominal cramping or pain.   You have rapid weight loss or gain.   You have shortness of breath with chest pain.   You notice sudden or extreme swelling of your face, hands, ankles, feet, or legs.   You have not felt your baby move in over an hour.   You have severe headaches that do not go away with medicine.   You have vision changes.  Document Released: 11/23/2001 Document Revised: 08/01/2013 Document Reviewed: 01/30/2013  [employer] Patient Information 2014 [employer].

## 2014-02-15 NOTE — Progress Notes (Signed)
P= 91 C/o of intermittent lower abdominal/pelvic pressure and braxton hicks. Occasional, mild edema in feet.  Pt. Has missed multiple appointments. One hour still not done; pt. States she is going to do the jelly bean one, left them at home by accident, has an ultrasound this upcoming Wednesday at 0900 so she will come here at 0800 to get test started.

## 2014-02-15 NOTE — Progress Notes (Signed)
Has missed appointments. States never had glucola with any pregnancy. Jelly bean test scheduled. POCT glucose =80 and had soda 2-3 hrs ago. Scheduled for jelly bean test next wk and 6th C/S is scheduled for 03/08/14. US already scheduled for next week. PreE and labor precautions. Watch BP. GC/CT, GBS,CBC,RPR done.

## 2014-02-16 LAB — RPR

## 2014-02-17 LAB — GC/CHLAMYDIA PROBE AMP
CT Probe RNA: NEGATIVE
GC Probe RNA: NEGATIVE

## 2014-02-19 LAB — CULTURE, BETA STREP (GROUP B ONLY)

## 2014-02-20 ENCOUNTER — Ambulatory Visit (HOSPITAL_COMMUNITY): Admission: RE | Admit: 2014-02-20 | Payer: Medicaid Other | Source: Ambulatory Visit

## 2014-02-23 ENCOUNTER — Encounter (HOSPITAL_COMMUNITY): Payer: Self-pay | Admitting: *Deleted

## 2014-02-23 ENCOUNTER — Inpatient Hospital Stay (HOSPITAL_COMMUNITY)
Admission: AD | Admit: 2014-02-23 | Discharge: 2014-02-23 | Disposition: A | Discharge status: Home / Self Care | Payer: Medicaid Other | Source: Ambulatory Visit | Attending: Obstetrics & Gynecology | Admitting: Obstetrics & Gynecology

## 2014-02-23 DIAGNOSIS — O9989 Other specified diseases and conditions complicating pregnancy, childbirth and the puerperium: Secondary | ICD-10-CM

## 2014-02-23 DIAGNOSIS — O34219 Maternal care for unspecified type scar from previous cesarean delivery: Secondary | ICD-10-CM

## 2014-02-23 DIAGNOSIS — R51 Headache: Secondary | ICD-10-CM

## 2014-02-23 DIAGNOSIS — O26899 Other specified pregnancy related conditions, unspecified trimester: Secondary | ICD-10-CM

## 2014-02-23 DIAGNOSIS — O99891 Other specified diseases and conditions complicating pregnancy: Secondary | ICD-10-CM | POA: Insufficient documentation

## 2014-02-23 DIAGNOSIS — G43909 Migraine, unspecified, not intractable, without status migrainosus: Secondary | ICD-10-CM | POA: Insufficient documentation

## 2014-02-23 LAB — URINALYSIS, ROUTINE W REFLEX MICROSCOPIC
Bilirubin Urine: NEGATIVE
Glucose, UA: NEGATIVE mg/dL
Hgb urine dipstick: NEGATIVE
KETONES UR: NEGATIVE mg/dL
LEUKOCYTES UA: NEGATIVE
Nitrite: NEGATIVE
PH: 6 (ref 5.0–8.0)
Protein, ur: NEGATIVE mg/dL
SPECIFIC GRAVITY, URINE: 1.015 (ref 1.005–1.030)
Urobilinogen, UA: 0.2 mg/dL (ref 0.0–1.0)

## 2014-02-23 MED ORDER — BUTALBITAL-APAP-CAFFEINE 50-325-40 MG PO TABS
2.0000 | ORAL_TABLET | Freq: Once | ORAL | Status: AC
  Administered 2014-02-23: 2 via ORAL
  Filled 2014-02-23: qty 2

## 2014-02-23 NOTE — MAU Note (Signed)
Patient presents with a migraine headache since yesterday and possible ROM. States she has been leaking clear fluid since 1430 today.

## 2014-02-23 NOTE — Discharge Instructions (Signed)
Braxton Hicks Contractions Pregnancy is commonly associated with contractions of the uterus throughout the pregnancy. Towards the end of pregnancy (32 to 34 weeks), these contractions [facility](Braxton Willa Rough[facility]icks) can develop more often and may become more forceful. This is not true labor because these contractions do not result in opening (dilatation) and thinning of the cervix. They are sometimes difficult to tell apart from true labor because these contractions can be forceful and people have different pain tolerances. You should not feel embarrassed if you go to the hospital with false labor. Sometimes, the only way to tell if you are in true labor is for your caregiver to follow the changes in the cervix. How to tell the difference between true and false labor:  False labor.  The contractions of false labor are usually shorter, irregular and not as hard as those of true labor.  They are often felt in the front of the lower abdomen and in the groin.  They may leave with walking around or changing positions while lying down.  They get weaker and are shorter lasting as time goes on.  These contractions are usually irregular.  They do not usually become progressively stronger, regular and closer together as with true labor.  True labor.  Contractions in true labor last 30 to 70 seconds, become very regular, usually become more intense, and increase in frequency.  They do not go away with walking.  The discomfort is usually felt in the top of the uterus and spreads to the lower abdomen and low back.  True labor can be determined by your caregiver with an exam. This will show that the cervix is dilating and getting thinner. If there are no prenatal problems or other health problems associated with the pregnancy, it is completely safe to be sent home with false labor and await the onset of true labor. HOME CARE INSTRUCTIONS   Keep up with your usual exercises and instructions.  Take medications as  directed.  Keep your regular prenatal appointment.  Eat and drink lightly if you think you are going into labor.  If BH contractions are making you uncomfortable:  Change your activity position from lying down or resting to walking/walking to resting.  Sit and rest in a tub of warm water.  Drink 2 to 3 glasses of water. Dehydration may cause B-H contractions.  Do slow and deep breathing several times an hour. SEEK IMMEDIATE MEDICAL CARE IF:   Your contractions continue to become stronger, more regular, and closer together.  You have a gushing, burst or leaking of fluid from the vagina.  An oral temperature above 102 F (38.9 C) develops.  You have passage of blood-tinged mucus.  You develop vaginal bleeding.  You develop continuous belly (abdominal) pain.  You have low back pain that you never had before.  You feel the baby's head pushing down causing pelvic pressure.  The baby is not moving as much as it used to. Document Released: 11/29/2005 Document Revised: 02/21/2012 Document Reviewed: 09/10/2013 [facility] Patient Information 2014 [place].  Migraine Headache A migraine headache is an intense, throbbing pain on one or both sides of your head. A migraine can last for 30 minutes to several hours. CAUSES  The exact cause of a migraine headache is not always known. However, a migraine may be caused when nerves in the brain become irritated and release chemicals that cause inflammation. This causes pain. Certain things may also trigger migraines, such as:  Alcohol.  Smoking.  Stress.  Menstruation.  Aged cheeses.  Foods or drinks that contain nitrates, glutamate, aspartame, or tyramine.  Lack of sleep.  Chocolate.  Caffeine.  Hunger.  Physical exertion.  Fatigue.  Medicines used to treat chest pain (nitroglycerine), birth control pills, estrogen, and some blood pressure medicines. SIGNS AND SYMPTOMS  Pain on one or both sides of your  head.  Pulsating or throbbing pain.  Severe pain that prevents daily activities.  Pain that is aggravated by any physical activity.  Nausea, vomiting, or both.  Dizziness.  Pain with exposure to bright lights, loud noises, or activity.  General sensitivity to bright lights, loud noises, or smells. Before you get a migraine, you may get warning signs that a migraine is coming (aura). An aura may include:  Seeing flashing lights.  Seeing bright spots, halos, or zig-zag lines.  Having tunnel vision or blurred vision.  Having feelings of numbness or tingling.  Having trouble talking.  Having muscle weakness. DIAGNOSIS  A migraine headache is often diagnosed based on:  Symptoms.  Physical exam.  A CT scan or MRI of your head. These imaging tests cannot diagnose migraines, but they can help rule out other causes of headaches. TREATMENT Medicines may be given for pain and nausea. Medicines can also be given to help prevent recurrent migraines.  HOME CARE INSTRUCTIONS  Only take over-the-counter or prescription medicines for pain or discomfort as directed by your health care provider. The use of long-term narcotics is not recommended.  Lie down in a dark, quiet room when you have a migraine.  Keep a journal to find out what may trigger your migraine headaches. For example, write down:  What you eat and drink.  How much sleep you get.  Any change to your diet or medicines.  Limit alcohol consumption.  Quit smoking if you smoke.  Get 7 9 hours of sleep, or as recommended by your health care provider.  Limit stress.  Keep lights dim if bright lights bother you and make your migraines worse. SEEK IMMEDIATE MEDICAL CARE IF:   Your migraine becomes severe.  You have a fever.  You have a stiff neck.  You have vision loss.  You have muscular weakness or loss of muscle control.  You start losing your balance or have trouble walking.  You feel faint or pass  out.  You have severe symptoms that are different from your first symptoms. MAKE SURE YOU:   Understand these instructions.  Will watch your condition.  Will get help right away if you are not doing well or get worse. Document Released: 11/29/2005 Document Revised: 09/19/2013 Document Reviewed: 08/06/2013 [facility] Patient Information 2014 [place], [place].

## 2014-02-23 NOTE — MAU Provider Note (Signed)
History  Chief Complaint:  Migraine and Possible ROM   [name] [name] is a 33 y.o. [phone] female at [redacted]w[redacted]d presenting w/ report of generalized/crown ha x 1 day, took 1 fioricet this am around 0900 which didn't help. Describes as constant sharp pain. + phonophobia. No throbbing/pulsing, not unilateral, no photophobia, no n/v. States she's had occ ha's during pregnancy. Also w/ leakage of clear fluid at 1430, none since. Denies abnormal/malodorous d/c, itching/irritation.  Reports active fetal movement, contractions: irregular, vaginal bleeding: none, membranes: poss rom.  Prenatal [facility] at [facility].  Next visit 3/17. Pregnancy complicated by previous c/s x 5, last w/ severe adhesions, +[place] trait.   Obstetrical History: OB History   Grav Para Term Preterm Abortions TAB SAB Ect Mult Living   7 5 5  1  1   5       Past Medical History: Past Medical History  Diagnosis Date  . Migraine   . Hypertension   . Sickle cell trait   . Depression     PTSD (1997) major depression (2013)  . Pregnant     Past Surgical History: Past Surgical History  Procedure Laterality Date  . Cesarean section      Social History: History   Social History  . Marital Status: Married    Spouse Name: N/A    Number of Children: N/A  . Years of Education: N/A   Social History Main Topics  . Smoking status: Never Smoker   . Smokeless tobacco: Never Used  . Alcohol Use: No     Comment: ocassionaly before pregnancy  . Drug Use: No  . Sexual Activity: Not Currently    Birth Control/ Protection: None   Other Topics Concern  . None   Social History Narrative   ** Merged History Encounter **        Allergies: Allergies  Allergen Reactions  . Fish Allergy Rash    Any kind of seafood  . Penicillins Swelling and Rash    Prescriptions prior to admission  Medication Sig Dispense Refill  . Prenatal Vit-Fe Fumarate-FA (PRENATAL MULTIVITAMIN) TABS tablet Take 1 tablet by mouth daily at 12 noon.      .  promethazine (PHENERGAN) 25 MG tablet Take 1 tablet (25 mg total) by mouth every 6 (six) hours as needed for nausea.  30 tablet  2    Review of Systems  Pertinent pos/neg as indicated in HPI  Physical Exam  Blood pressure 138/86, pulse 107, temperature 98.7 F (37.1 C), temperature source Oral, resp. rate 20, height 5\' 4"  (1.626 m), weight 94.348 kg (208 lb), last menstrual period 06/09/2013. General appearance: alert, cooperative and no distress Lungs: clear to auscultation bilaterally, normal effort Heart: regular rate and rhythm Abdomen: gravid, soft, non-tender Extremities: No edema  Spec exam: cx visually closed, small amount creamy white nonodorous d/c, neg pooling w/ valsalva Cultures/Specimens: fern neg  Dilation: Closed Effacement (%): 50 Cervical Position: Anterior Station: -3 Presentation: Undeterminable Exam by:: Shawna Clamp, CNM Presentation: cephalic by leopold's  Fetal monitoring: FHR: 150 bpm, variability: moderate,  Accelerations: Present,  decelerations:  Absent Uterine activity: occ w/ ui  MAU Course  Exam Spec exam w/ neg pooling, neg fern SVE NST Fioricet po x 2  1755: HA relieved, feels much better  Labs:  Results for orders placed during the hospital encounter of 02/23/14 (from the past 24 hour(s))  URINALYSIS, ROUTINE W REFLEX MICROSCOPIC     Status: None   Collection Time    02/23/14  3:07  PM      Result Value Ref Range   Color, Urine YELLOW  YELLOW   APPearance CLEAR  CLEAR   Specific Gravity, Urine 1.015  1.005 - 1.030   pH 6.0  5.0 - 8.0   Glucose, UA NEGATIVE  NEGATIVE mg/dL   Hgb urine dipstick NEGATIVE  NEGATIVE   Bilirubin Urine NEGATIVE  NEGATIVE   Ketones, ur NEGATIVE  NEGATIVE mg/dL   Protein, ur NEGATIVE  NEGATIVE mg/dL   Urobilinogen, UA 0.2  0.0 - 1.0 mg/dL   Nitrite NEGATIVE  NEGATIVE   Leukocytes, UA NEGATIVE  NEGATIVE    Imaging:  n/a  Assessment and Plan  A:  6461w0d SIUP  [id number]  Headache  Intact  membranes  Cat 1 FHR P:  D/C home  Reviewed labor s/s, fkc, ha prevention/relief measures  [facility] as scheduled   Marge DuncansBooker, Jerold Yoss Randall CNM,WHNP-BC 3/14/20154:54 PM

## 2014-02-25 DIAGNOSIS — Z22338 Carrier of other streptococcus: Secondary | ICD-10-CM | POA: Insufficient documentation

## 2014-02-26 ENCOUNTER — Ambulatory Visit (INDEPENDENT_AMBULATORY_CARE_PROVIDER_SITE_OTHER): Payer: Medicaid Other | Admitting: Family

## 2014-02-26 VITALS — BP 111/65 | Wt 208.7 lb

## 2014-02-26 DIAGNOSIS — Z3480 Encounter for supervision of other normal pregnancy, unspecified trimester: Secondary | ICD-10-CM

## 2014-02-26 DIAGNOSIS — O139 Gestational [pregnancy-induced] hypertension without significant proteinuria, unspecified trimester: Secondary | ICD-10-CM

## 2014-02-26 DIAGNOSIS — O99810 Abnormal glucose complicating pregnancy: Secondary | ICD-10-CM

## 2014-02-26 DIAGNOSIS — Z22338 Carrier of other streptococcus: Secondary | ICD-10-CM

## 2014-02-26 LAB — POCT URINALYSIS DIP (DEVICE)
BILIRUBIN URINE: NEGATIVE
GLUCOSE, UA: NEGATIVE mg/dL
Hgb urine dipstick: NEGATIVE
KETONES UR: NEGATIVE mg/dL
Leukocytes, UA: NEGATIVE
NITRITE: NEGATIVE
PH: 6 (ref 5.0–8.0)
Protein, ur: NEGATIVE mg/dL
Specific Gravity, Urine: 1.015 (ref 1.005–1.030)
Urobilinogen, UA: 0.2 mg/dL (ref 0.0–1.0)

## 2014-02-26 LAB — GLUCOSE, CAPILLARY: GLUCOSE-CAPILLARY: 149 mg/dL — AB (ref 70–99)

## 2014-02-26 NOTE — Progress Notes (Signed)
Pulse- 102  Edema-feet  Pain-pelvic

## 2014-02-26 NOTE — Progress Notes (Signed)
Pt denies headache, vision changes, or epigastric pain.  Pt did not come in for jelly bean challenge.  Pt continues to refuse glucola.  Random BS 149.  Growth US scheduled.  Pt also states states diagnosed with HTN in between pregnancies, indicated also in medical history (repeat BP wnl), will begin fetal testing today with scheduled csection next Friday (already scheduled).

## 2014-02-28 ENCOUNTER — Ambulatory Visit (HOSPITAL_COMMUNITY)
Admission: RE | Admit: 2014-02-28 | Discharge: 2014-02-28 | Disposition: A | Discharge status: Home / Self Care | Payer: Medicaid Other | Source: Ambulatory Visit | Attending: Family | Admitting: Family

## 2014-02-28 DIAGNOSIS — O10019 Pre-existing essential hypertension complicating pregnancy, unspecified trimester: Secondary | ICD-10-CM | POA: Insufficient documentation

## 2014-02-28 DIAGNOSIS — O34219 Maternal care for unspecified type scar from previous cesarean delivery: Secondary | ICD-10-CM | POA: Insufficient documentation

## 2014-02-28 DIAGNOSIS — O99810 Abnormal glucose complicating pregnancy: Secondary | ICD-10-CM

## 2014-02-28 DIAGNOSIS — O139 Gestational [pregnancy-induced] hypertension without significant proteinuria, unspecified trimester: Secondary | ICD-10-CM

## 2014-02-28 NOTE — MAU Provider Note (Signed)
Attestation of Attending Supervision of Advanced Practitioner (CNM/NP): Evaluation and management procedures were performed by the Advanced Practitioner under my supervision and collaboration. I have reviewed the Advanced Practitioner's note and chart, and I agree with the management and plan.  Navi Erber H. 2:28 PM

## 2014-03-01 ENCOUNTER — Encounter (HOSPITAL_COMMUNITY): Payer: Self-pay

## 2014-03-01 ENCOUNTER — Encounter (HOSPITAL_COMMUNITY): Payer: Self-pay | Admitting: Pharmacist

## 2014-03-01 ENCOUNTER — Encounter (HOSPITAL_COMMUNITY)
Admission: RE | Admit: 2014-03-01 | Discharge: 2014-03-01 | Disposition: A | Discharge status: Home / Self Care | Payer: Medicaid Other | Source: Ambulatory Visit | Attending: Obstetrics & Gynecology | Admitting: Obstetrics & Gynecology

## 2014-03-01 VITALS — BP 134/93 | HR 100 | Resp 20 | Ht 64.0 in | Wt 209.0 lb

## 2014-03-01 DIAGNOSIS — O34219 Maternal care for unspecified type scar from previous cesarean delivery: Secondary | ICD-10-CM

## 2014-03-01 LAB — CBC
HCT: 37.6 % (ref 36.0–46.0)
HEMOGLOBIN: 12.8 g/dL (ref 12.0–15.0)
MCH: 27.4 pg (ref 26.0–34.0)
MCHC: 34 g/dL (ref 30.0–36.0)
MCV: 80.5 fL (ref 78.0–100.0)
Platelets: 253 10*3/uL (ref 150–400)
RBC: 4.67 MIL/uL (ref 3.87–5.11)
RDW: 14.6 % (ref 11.5–15.5)
WBC: 8.5 10*3/uL (ref 4.0–10.5)

## 2014-03-01 LAB — RPR: RPR: NONREACTIVE

## 2014-03-01 MED ORDER — GENTAMICIN SULFATE 40 MG/ML IJ SOLN
INTRAVENOUS | Status: AC
  Administered 2014-03-02: 100 mL via INTRAVENOUS
  Filled 2014-03-01: qty 8.5

## 2014-03-01 NOTE — Patient Instructions (Addendum)
   Your procedure is scheduled on:  Saturday, Mar 21  [facility] at:  730 AM Pick up the phone at the desk and dial [phone] and inform us of your arrival.  Please call this number if you have any problems the morning of surgery: [phone]  Remember: Do not eat or drink after midnight Friday. Take these medicines the morning of surgery with a SIP OF WATER:  None  Do not wear jewelry, make-up, or FINGER nail polish No metal in your hair or on your body. Do not wear lotions, powders, perfumes.  You may wear deodorant.  Do not bring valuables to the hospital. Contacts, dentures or bridgework may not be worn into surgery.  Leave suitcase in the car. After Surgery it may be brought to your room. For patients being admitted to the hospital, checkout time is 11:00am the day of discharge.  Home with husband [place] cell [phone].

## 2014-03-02 ENCOUNTER — Encounter (HOSPITAL_COMMUNITY): Payer: Medicaid Other | Admitting: Anesthesiology

## 2014-03-02 ENCOUNTER — Encounter (HOSPITAL_COMMUNITY)
Admission: RE | Disposition: A | Discharge status: Home / Self Care | Payer: Self-pay | Source: Ambulatory Visit | Attending: Obstetrics & Gynecology

## 2014-03-02 ENCOUNTER — Inpatient Hospital Stay (HOSPITAL_COMMUNITY)
Admission: RE | Admit: 2014-03-02 | Discharge: 2014-03-05 | DRG: 765 | Disposition: A | Discharge status: Home / Self Care | Payer: Medicaid Other | Source: Ambulatory Visit | Attending: Obstetrics & Gynecology | Admitting: Obstetrics & Gynecology

## 2014-03-02 ENCOUNTER — Encounter (HOSPITAL_COMMUNITY): Payer: Self-pay

## 2014-03-02 ENCOUNTER — Inpatient Hospital Stay (HOSPITAL_COMMUNITY): Payer: Medicaid Other | Admitting: Anesthesiology

## 2014-03-02 DIAGNOSIS — O9989 Other specified diseases and conditions complicating pregnancy, childbirth and the puerperium: Secondary | ICD-10-CM

## 2014-03-02 DIAGNOSIS — M79609 Pain in unspecified limb: Secondary | ICD-10-CM | POA: Diagnosis present

## 2014-03-02 DIAGNOSIS — F329 Major depressive disorder, single episode, unspecified: Secondary | ICD-10-CM | POA: Diagnosis present

## 2014-03-02 DIAGNOSIS — Z302 Encounter for sterilization: Secondary | ICD-10-CM

## 2014-03-02 DIAGNOSIS — O34219 Maternal care for unspecified type scar from previous cesarean delivery: Secondary | ICD-10-CM

## 2014-03-02 DIAGNOSIS — Z98891 History of uterine scar from previous surgery: Secondary | ICD-10-CM

## 2014-03-02 DIAGNOSIS — O99893 Other specified diseases and conditions complicating puerperium: Secondary | ICD-10-CM | POA: Diagnosis present

## 2014-03-02 DIAGNOSIS — K560 Paralytic ileus: Secondary | ICD-10-CM | POA: Diagnosis not present

## 2014-03-02 DIAGNOSIS — O99344 Other mental disorders complicating childbirth: Secondary | ICD-10-CM | POA: Diagnosis present

## 2014-03-02 DIAGNOSIS — E669 Obesity, unspecified: Secondary | ICD-10-CM | POA: Diagnosis present

## 2014-03-02 DIAGNOSIS — D573 Sickle-cell trait: Secondary | ICD-10-CM | POA: Diagnosis present

## 2014-03-02 DIAGNOSIS — F3289 Other specified depressive episodes: Secondary | ICD-10-CM | POA: Diagnosis present

## 2014-03-02 DIAGNOSIS — O9902 Anemia complicating childbirth: Secondary | ICD-10-CM | POA: Diagnosis present

## 2014-03-02 DIAGNOSIS — O1002 Pre-existing essential hypertension complicating childbirth: Secondary | ICD-10-CM | POA: Diagnosis present

## 2014-03-02 DIAGNOSIS — R609 Edema, unspecified: Secondary | ICD-10-CM | POA: Diagnosis present

## 2014-03-02 DIAGNOSIS — O321XX0 Maternal care for breech presentation, not applicable or unspecified: Secondary | ICD-10-CM | POA: Diagnosis present

## 2014-03-02 DIAGNOSIS — O99214 Obesity complicating childbirth: Secondary | ICD-10-CM

## 2014-03-02 LAB — PREPARE RBC (CROSSMATCH)

## 2014-03-02 SURGERY — Surgical Case
Anesthesia: Spinal | Site: Abdomen | Laterality: Bilateral

## 2014-03-02 MED ORDER — SIMETHICONE 80 MG PO CHEW
80.0000 mg | CHEWABLE_TABLET | ORAL | Status: DC | PRN
  Filled 2014-03-02: qty 1

## 2014-03-02 MED ORDER — PHENYLEPHRINE HCL 10 MG/ML IJ SOLN
INTRAMUSCULAR | Status: AC
  Filled 2014-03-02: qty 1

## 2014-03-02 MED ORDER — BUPIVACAINE IN DEXTROSE 0.75-8.25 % IT SOLN
INTRATHECAL | Status: DC | PRN
  Administered 2014-03-02: 1.7 mL via INTRATHECAL

## 2014-03-02 MED ORDER — SCOPOLAMINE 1 MG/3DAYS TD PT72
1.0000 | MEDICATED_PATCH | Freq: Once | TRANSDERMAL | Status: DC
  Administered 2014-03-02: 1.5 mg via TRANSDERMAL

## 2014-03-02 MED ORDER — FENTANYL CITRATE 0.05 MG/ML IJ SOLN
25.0000 ug | INTRAMUSCULAR | Status: DC | PRN
  Administered 2014-03-02: 25 ug via INTRAVENOUS
  Administered 2014-03-02: 50 ug via INTRAVENOUS
  Administered 2014-03-02: 25 ug via INTRAVENOUS

## 2014-03-02 MED ORDER — OXYTOCIN 10 UNIT/ML IJ SOLN
40.0000 [IU] | INTRAVENOUS | Status: DC | PRN
  Administered 2014-03-02: 40 [IU] via INTRAVENOUS

## 2014-03-02 MED ORDER — ONDANSETRON HCL 4 MG/2ML IJ SOLN: 4.0000 mg | INTRAMUSCULAR | Status: DC | PRN

## 2014-03-02 MED ORDER — FENTANYL CITRATE 0.05 MG/ML IJ SOLN
INTRAMUSCULAR | Status: AC
  Administered 2014-03-02: 50 ug via INTRAVENOUS
  Filled 2014-03-02: qty 2

## 2014-03-02 MED ORDER — SIMETHICONE 80 MG PO CHEW
80.0000 mg | CHEWABLE_TABLET | ORAL | Status: DC
  Administered 2014-03-02 – 2014-03-04 (×3): 80 mg via ORAL
  Filled 2014-03-02 (×3): qty 1

## 2014-03-02 MED ORDER — MORPHINE SULFATE (PF) 0.5 MG/ML IJ SOLN
INTRAMUSCULAR | Status: DC | PRN
  Administered 2014-03-02: .15 mg via INTRATHECAL

## 2014-03-02 MED ORDER — TETANUS-DIPHTH-ACELL PERTUSSIS 5-2.5-18.5 LF-MCG/0.5 IM SUSP: 0.5000 mL | Freq: Once | INTRAMUSCULAR | Status: DC

## 2014-03-02 MED ORDER — MAGNESIUM HYDROXIDE 400 MG/5ML PO SUSP
30.0000 mL | ORAL | Status: DC | PRN
Start: 2014-03-02 — End: 2014-03-05

## 2014-03-02 MED ORDER — PHENYLEPHRINE 40 MCG/ML (10ML) SYRINGE FOR IV PUSH (FOR BLOOD PRESSURE SUPPORT)
PREFILLED_SYRINGE | INTRAVENOUS | Status: AC
  Filled 2014-03-02: qty 5

## 2014-03-02 MED ORDER — SCOPOLAMINE 1 MG/3DAYS TD PT72
MEDICATED_PATCH | TRANSDERMAL | Status: AC
  Filled 2014-03-02: qty 1

## 2014-03-02 MED ORDER — SENNOSIDES-DOCUSATE SODIUM 8.6-50 MG PO TABS
2.0000 | ORAL_TABLET | ORAL | Status: DC
  Administered 2014-03-02 – 2014-03-04 (×3): 2 via ORAL
  Filled 2014-03-02 (×3): qty 2

## 2014-03-02 MED ORDER — FENTANYL CITRATE 0.05 MG/ML IJ SOLN
INTRAMUSCULAR | Status: AC
  Filled 2014-03-02: qty 2

## 2014-03-02 MED ORDER — SIMETHICONE 80 MG PO CHEW
80.0000 mg | CHEWABLE_TABLET | Freq: Three times a day (TID) | ORAL | Status: DC
  Administered 2014-03-02 – 2014-03-05 (×12): 80 mg via ORAL
  Filled 2014-03-02 (×11): qty 1

## 2014-03-02 MED ORDER — FENTANYL CITRATE 0.05 MG/ML IJ SOLN
INTRAMUSCULAR | Status: DC | PRN
  Administered 2014-03-02: 25 ug via INTRATHECAL

## 2014-03-02 MED ORDER — OXYTOCIN 40 UNITS IN LACTATED RINGERS INFUSION - SIMPLE MED: 62.5000 mL/h | INTRAVENOUS | Status: AC

## 2014-03-02 MED ORDER — OXYCODONE-ACETAMINOPHEN 5-325 MG PO TABS
1.0000 | ORAL_TABLET | ORAL | Status: DC | PRN
  Administered 2014-03-03 (×2): 1 via ORAL
  Administered 2014-03-03 – 2014-03-04 (×4): 2 via ORAL
  Administered 2014-03-04 (×3): 1 via ORAL
  Administered 2014-03-04: 2 via ORAL
  Administered 2014-03-05: 1 via ORAL
  Administered 2014-03-05: 2 via ORAL
  Filled 2014-03-02 (×2): qty 2
  Filled 2014-03-02 (×2): qty 1
  Filled 2014-03-02: qty 2
  Filled 2014-03-02: qty 1
  Filled 2014-03-02: qty 2
  Filled 2014-03-02 (×3): qty 1
  Filled 2014-03-02 (×2): qty 2

## 2014-03-02 MED ORDER — PRENATAL MULTIVITAMIN CH
1.0000 | ORAL_TABLET | Freq: Every day | ORAL | Status: DC
  Administered 2014-03-03 – 2014-03-05 (×2): 1 via ORAL
  Filled 2014-03-02 (×2): qty 1

## 2014-03-02 MED ORDER — BUPIVACAINE HCL (PF) 0.5 % IJ SOLN
INTRAMUSCULAR | Status: DC | PRN
  Administered 2014-03-02: 30 mL

## 2014-03-02 MED ORDER — WITCH HAZEL-GLYCERIN EX PADS: 1.0000 "application " | MEDICATED_PAD | CUTANEOUS | Status: DC | PRN

## 2014-03-02 MED ORDER — LANOLIN HYDROUS EX OINT: 1.0000 "application " | TOPICAL_OINTMENT | CUTANEOUS | Status: DC | PRN

## 2014-03-02 MED ORDER — MEPERIDINE HCL 25 MG/ML IJ SOLN: 6.2500 mg | INTRAMUSCULAR | Status: DC | PRN

## 2014-03-02 MED ORDER — ONDANSETRON HCL 4 MG PO TABS: 4.0000 mg | ORAL_TABLET | ORAL | Status: DC | PRN

## 2014-03-02 MED ORDER — ONDANSETRON HCL 4 MG/2ML IJ SOLN
INTRAMUSCULAR | Status: DC | PRN
  Administered 2014-03-02: 4 mg via INTRAVENOUS

## 2014-03-02 MED ORDER — BUPIVACAINE IN DEXTROSE 0.75-8.25 % IT SOLN
INTRATHECAL | Status: AC
  Filled 2014-03-02: qty 2

## 2014-03-02 MED ORDER — LACTATED RINGERS IV SOLN
INTRAVENOUS | Status: DC
  Administered 2014-03-03: via INTRAVENOUS

## 2014-03-02 MED ORDER — DIBUCAINE 1 % RE OINT: 1.0000 "application " | TOPICAL_OINTMENT | RECTAL | Status: DC | PRN

## 2014-03-02 MED ORDER — PHENYLEPHRINE 8 MG IN D5W 100 ML (0.08MG/ML) PREMIX OPTIME
INJECTION | INTRAVENOUS | Status: DC | PRN
Start: 2014-03-02 — End: 2014-03-02
  Administered 2014-03-02: 60 ug/min via INTRAVENOUS

## 2014-03-02 MED ORDER — DIPHENHYDRAMINE HCL 25 MG PO CAPS: 25.0000 mg | ORAL_CAPSULE | Freq: Four times a day (QID) | ORAL | Status: DC | PRN

## 2014-03-02 MED ORDER — MORPHINE SULFATE 0.5 MG/ML IJ SOLN
INTRAMUSCULAR | Status: AC
  Filled 2014-03-02: qty 10

## 2014-03-02 MED ORDER — ONDANSETRON HCL 4 MG/2ML IJ SOLN
INTRAMUSCULAR | Status: AC
  Filled 2014-03-02: qty 2

## 2014-03-02 MED ORDER — ZOLPIDEM TARTRATE 5 MG PO TABS: 5.0000 mg | ORAL_TABLET | Freq: Every evening | ORAL | Status: DC | PRN

## 2014-03-02 MED ORDER — LACTATED RINGERS IV SOLN: INTRAVENOUS | Status: DC

## 2014-03-02 MED ORDER — LACTATED RINGERS IV SOLN
INTRAVENOUS | Status: DC
  Administered 2014-03-02 (×2): via INTRAVENOUS
  Administered 2014-03-02 (×2): 125 mL/h via INTRAVENOUS

## 2014-03-02 MED ORDER — IBUPROFEN 600 MG PO TABS
600.0000 mg | ORAL_TABLET | Freq: Four times a day (QID) | ORAL | Status: DC
  Administered 2014-03-02 – 2014-03-05 (×11): 600 mg via ORAL
  Filled 2014-03-02 (×11): qty 1

## 2014-03-02 MED ORDER — MEASLES, MUMPS & RUBELLA VAC ~~LOC~~ INJ: 0.5000 mL | INJECTION | Freq: Once | SUBCUTANEOUS | Status: DC

## 2014-03-02 MED ORDER — MENTHOL 3 MG MT LOZG: 1.0000 | LOZENGE | OROMUCOSAL | Status: DC | PRN

## 2014-03-02 MED ORDER — OXYTOCIN 10 UNIT/ML IJ SOLN
INTRAMUSCULAR | Status: AC
  Filled 2014-03-02: qty 4

## 2014-03-02 MED ORDER — PHENYLEPHRINE HCL 10 MG/ML IJ SOLN
INTRAMUSCULAR | Status: DC | PRN
  Administered 2014-03-02: 80 ug via INTRAVENOUS
  Administered 2014-03-02: 40 ug via INTRAVENOUS
  Administered 2014-03-02: 80 ug via INTRAVENOUS

## 2014-03-02 SURGICAL SUPPLY — 54 items
BARRIER ADHS 3X4 INTERCEED (GAUZE/BANDAGES/DRESSINGS) IMPLANT
BRR ADH 4X3 ABS CNTRL BYND (GAUZE/BANDAGES/DRESSINGS)
CLAMP CORD UMBIL (MISCELLANEOUS) IMPLANT
CLIP FILSHIE TUBAL LIGA STRL (Clip) ×2 IMPLANT
CLOSURE WOUND 1/2 X4 (GAUZE/BANDAGES/DRESSINGS) ×1
CLOTH BEACON ORANGE TIMEOUT ST (SAFETY) ×3 IMPLANT
CONTAINER PREFILL 10% NBF 15ML (MISCELLANEOUS) IMPLANT
DRAPE LG THREE QUARTER DISP (DRAPES) IMPLANT
DRSG OPSITE POSTOP 4X10 (GAUZE/BANDAGES/DRESSINGS) ×3 IMPLANT
DRSG OPSITE POSTOP 4X12 (GAUZE/BANDAGES/DRESSINGS) ×2 IMPLANT
DURAPREP 26ML APPLICATOR (WOUND CARE) ×3 IMPLANT
ELECT REM PT RETURN 9FT ADLT (ELECTROSURGICAL) ×3
ELECTRODE REM PT RTRN 9FT ADLT (ELECTROSURGICAL) ×1 IMPLANT
EXTRACTOR VACUUM KIWI (MISCELLANEOUS) IMPLANT
GLOVE BIO SURGEON STRL SZ 6.5 (GLOVE) ×4 IMPLANT
GLOVE BIO SURGEON STRL SZ8.5 (GLOVE) ×6 IMPLANT
GLOVE BIO SURGEONS STRL SZ 6.5 (GLOVE) ×2
GLOVE BIOGEL PI IND STRL 6.5 (GLOVE) ×2 IMPLANT
GLOVE BIOGEL PI IND STRL 7.0 (GLOVE) IMPLANT
GLOVE BIOGEL PI IND STRL 7.5 (GLOVE) ×3 IMPLANT
GLOVE BIOGEL PI IND STRL 9 (GLOVE) IMPLANT
GLOVE BIOGEL PI INDICATOR 6.5 (GLOVE) ×4
GLOVE BIOGEL PI INDICATOR 7.0 (GLOVE) ×2
GLOVE BIOGEL PI INDICATOR 7.5 (GLOVE) ×6
GLOVE BIOGEL PI INDICATOR 9 (GLOVE) ×2
GLOVE ECLIPSE 7.0 STRL STRAW (GLOVE) ×2 IMPLANT
GLOVE SS BIOGEL STRL SZ 6.5 (GLOVE) IMPLANT
GLOVE SUPERSENSE BIOGEL SZ 6.5 (GLOVE) ×2
GOWN STRL REUS W/TWL 2XL LVL3 (GOWN DISPOSABLE) ×2 IMPLANT
GOWN STRL REUS W/TWL LRG LVL3 (GOWN DISPOSABLE) ×6 IMPLANT
HEMOSTAT SURGICEL 4X8 (HEMOSTASIS) ×2 IMPLANT
KIT ABG SYR 3ML LUER SLIP (SYRINGE) IMPLANT
NDL SPNL 18GX3.5 QUINCKE PK (NEEDLE) ×1 IMPLANT
NEEDLE HYPO 25X5/8 SAFETYGLIDE (NEEDLE) IMPLANT
NEEDLE SPNL 18GX3.5 QUINCKE PK (NEEDLE) ×3 IMPLANT
NS IRRIG 1000ML POUR BTL (IV SOLUTION) ×3 IMPLANT
PACK C SECTION WH (CUSTOM PROCEDURE TRAY) ×3 IMPLANT
PAD OB MATERNITY 4.3X12.25 (PERSONAL CARE ITEMS) ×3 IMPLANT
STRIP CLOSURE SKIN 1/2X4 (GAUZE/BANDAGES/DRESSINGS) ×2 IMPLANT
SUT PDS AB 0 CTX 60 (SUTURE) ×2 IMPLANT
SUT VIC AB 0 CT1 27 (SUTURE)
SUT VIC AB 0 CT1 27XBRD ANBCTR (SUTURE) IMPLANT
SUT VIC AB 0 CT1 36 (SUTURE) ×2 IMPLANT
SUT VIC AB 2-0 CT1 27 (SUTURE) ×3
SUT VIC AB 2-0 CT1 TAPERPNT 27 (SUTURE) ×1 IMPLANT
SUT VIC AB 2-0 CTX 36 (SUTURE) ×6 IMPLANT
SUT VIC AB 3-0 CT1 27 (SUTURE) ×3
SUT VIC AB 3-0 CT1 TAPERPNT 27 (SUTURE) ×1 IMPLANT
SUT VIC AB 3-0 SH 27 (SUTURE) ×3
SUT VIC AB 3-0 SH 27X BRD (SUTURE) ×1 IMPLANT
SYR 30ML LL (SYRINGE) ×3 IMPLANT
TOWEL OR 17X24 6PK STRL BLUE (TOWEL DISPOSABLE) ×3 IMPLANT
TRAY FOLEY CATH 14FR (SET/KITS/TRAYS/PACK) ×3 IMPLANT
WATER STERILE IRR 1000ML POUR (IV SOLUTION) ×3 IMPLANT

## 2014-03-02 NOTE — Progress Notes (Signed)
Called to talk to pt. PT has changed her mind and now desires BTL.  Consent addendum done and witnessed by nursing.    Patient desires permanent sterilization.  Other reversible forms of contraception were discussed with patient; she declines all other modalities. Risks of procedure discussed with patient including but not limited to: risk of regret, permanence of method, bleeding, infection, injury to surrounding organs and need for additional procedures.  Failure risk of 1-2 % with increased risk of ectopic gestation if pregnancy occurs was also discussed with patient.  Patient verbalized understanding of these risks and wants to proceed with sterilization.  Written informed consent obtained.  To OR when ready.  Jasiyah Paulding, Redmond BasemanKELI L, MD

## 2014-03-02 NOTE — H&P (Signed)
LABOR ADMISSION HISTORY AND PHYSICAL   [name] [name] is a 33 y.o. female [phone] with IUP at [redacted]w[redacted]d by 7wk [place] presenting for elective repeat c/s x6. Pt has had 5 prior c/s and is here today for a scheduled repeat. No lof, vb, ctx. +FM.   [facility]    Prenatal History/Complications:  Past Medical History: Past Medical History  Diagnosis Date  . Sickle cell trait   . Pregnant   . Hypertension     Hx PIH with 2002 preg, no current problems  . Depression     PTSD (1997) major depression (2013) - no meds  . Migraine     otc med prn    Past Surgical History: Past Surgical History  Procedure Laterality Date  . Cesarean section      x 6  . Wisdom tooth extraction      Obstetrical History: OB History   Grav Para Term Preterm Abortions TAB SAB Ect Mult Living   7 5 5  1  1   5       Social History: History   Social History  . Marital Status: Married    Spouse Name: N/A    Number of Children: N/A  . Years of Education: N/A   Social History Main Topics  . Smoking status: Never Smoker   . Smokeless tobacco: Never Used  . Alcohol Use: No     Comment: ocassionaly before pregnancy  . Drug Use: No  . Sexual Activity: Not Currently    Birth Control/ Protection: None     Comment: pregnant   Other Topics Concern  . None   Social History Narrative   ** Merged History Encounter **        Family History: Family History  Problem Relation Age of Onset  . Hypertension Mother   . Hypertension Father   . Diabetes Father     Allergies: Allergies  Allergen Reactions  . Fish Allergy Rash    Any kind of seafood  . Penicillins Swelling and Rash    Pt. Says Amoxicillin is ok.    Prescriptions prior to admission  Medication Sig Dispense Refill  . Prenatal Vit-Fe Fumarate-FA (PRENATAL MULTIVITAMIN) TABS tablet Take 1 tablet by mouth daily at 12 noon.      . promethazine (PHENERGAN) 25 MG tablet Take 1 tablet (25 mg total) by mouth every 6 (six)  hours as needed for nausea.  30 tablet  2     Review of Systems  All systems reviewed and negative except as stated in HPI    Blood pressure 142/80, pulse 98, temperature 98.6 F (37 C), temperature source Oral, resp. rate 20, last menstrual period 06/09/2013, SpO2 100.00%. General appearance: alert, cooperative and no distress Lungs: clear to auscultation bilaterally Heart: regular rate and rhythm Abdomen: soft, non-tender; bowel sounds normal Extremities: Homans sign is negative, no sign of DVT Presentation: cephalic Fetal monitoring dopper 155      Prenatal labs: ABO, Rh: --/--/B POS (03/20 1325) Antibody: NEG (03/20 1325) Rubella:   RPR: NON REACTIVE (03/20 1325)  HBsAg: NEGATIVE (10/14 1442)  HIV: NON REACTIVE (10/14 1442)  GBS:    1 hr Glucola declined Genetic screening  normal Anatomy US normal    Assessment: [name] [name] is a 33 y.o. [id number] with an IUP at [redacted]w[redacted]d presenting for elective repeat c/s.    Plan: Long discussion was undertaken with the patient about the risks to a 6th c/s. Pt at the last minute  declined getting her tubes tied. We discussed risks of bleeding to death and damage to surrounding organs and consented her to an emergency hysterectomy if needed for bleeding.  Pt with very limited understanding but able to articulate her decision to not have a BTL and the risks involved in surgery today.   The risks of cesarean section discussed with the patient included but were not limited to: bleeding which may require transfusion or reoperation; infection which may require antibiotics; injury to bowel, bladder, ureters or other surrounding organs; injury to the fetus; need for additional procedures including hysterectomy in the event of a life-threatening hemorrhage; placental abnormalities wth subsequent pregnancies, incisional problems, thromboembolic phenomenon and other postoperative/anesthesia complications. The patient concurred with the proposed plan,  giving informed written consent for the procedure.   Patient has been NPO since midnight she will remain NPO for procedure. Anesthesia and OR aware. Preoperative prophylactic antibiotics and SCDs ordered on call to the OR.  To OR when ready.     Vale HavenBECK, Rohin Krejci L, MD 03/02/2014, 9:29 AM

## 2014-03-02 NOTE — Lactation Note (Signed)
This note was copied from the chart of [name] Elon Jesteregina Coca. Lactation Consultation Note Initial visit at 10 hours of age.  Mom reports just giving baby a bottle and unsure about plans for breast feeding.  Discussed with mom how bottle feeding affects baby at breast.  Hand expression demonstrated with colostrum visible.  [facility] given and discussed.  Mom to call for assist as needed.    Patient Name: [name] [name] ZOXWR'UToday's Date: 03/02/2014 Reason for consult: Initial assessment   Maternal Data Has patient been taught Hand Expression?: Yes Does the patient have breastfeeding experience prior to this delivery?: Yes  Feeding Feeding Type: Formula Nipple Type: Slow - flow  LATCH Score/Interventions                      Lactation Tools Discussed/Used     Consult Status Consult Status: PRN Follow-up type: In-patient    Jannifer RodneyShoptaw, Rabia Argote Lynn 03/02/2014, 11:09 PM

## 2014-03-02 NOTE — Op Note (Signed)
[name] [name] PROCEDURE DATE: 03/02/2014  PREOPERATIVE DIAGNOSES: Intrauterine pregnancy at  7464w0d weeks gestation; previous c/s x5; significant scar tissue noted on previous op notes; undesired fertility  POSTOPERATIVE DIAGNOSES: The same  PROCEDURE: Repeat Cesarean Section, Primary classical uterine incision with vertical skin incision, Bilateral Tubal Sterilization using Filshie clips  SURGEON:  Dr. Nicholaus BloomMyra Dove  ASSISTANT:  Dr. Rulon AbideKeli Jaron Czarnecki, Dr.John Emelda FearFerguson  ANESTHESIOLOGIST: Dr. Mal AmabileMichael Foster  INDICATIONS: [name] JesterRegina Burack is a 33 y.o. [phone] at 6864w0d here for cesarean section and bilateral tubal sterilization secondary to the indications listed under preoperative diagnoses; please see preoperative note for further details.  The risks of surgery were discussed with the patient including but were not limited to: bleeding which may require transfusion or reoperation; infection which may require antibiotics; injury to bowel, bladder, ureters or other surrounding organs; injury to the fetus; need for additional procedures including hysterectomy in the event of a life-threatening hemorrhage; placental abnormalities wth subsequent pregnancies, incisional problems, thromboembolic phenomenon and other postoperative/anesthesia complications.  Patient also desires permanent sterilization.  Other reversible forms of contraception were discussed with patient; she declines all other modalities. Risks of procedure discussed with patient including but not limited to: risk of regret, permanence of method, bleeding, infection, injury to surrounding organs and need for additional procedures.  Failure risk of 1-2% with increased risk of ectopic gestation if pregnancy occurs was also discussed with patient.  The patient concurred with the proposed plan, giving informed written consent for the procedures.    FINDINGS:  Viable female infant in cephalic presentation.  Apgars 8 and 9.  Clear amniotic fluid.  Intact placenta,  three vessel cord.  Uterus with dense scar tissue adhering the uterine arteries, bladder and uterine wall itself to the abdominal wall throughout the lower uterine segment. Normal fallopian tubes and ovaries bilaterally. Fallopian tubes sterilized with Filshie clips bilaterally.  ANESTHESIA: Combined Spinal and Epidural.  INTRAVENOUS FLUIDS: 2700 ml ESTIMATED BLOOD LOSS: 500 ml URINE OUTPUT:  150 ml SPECIMENS: Placenta sent to L&D COMPLICATIONS: None immediate  PROCEDURE IN DETAIL:  The patient preoperatively received intravenous antibiotics and had sequential compression devices applied to her lower extremities.   She was then taken to the operating room where a combined spinal and epidural anesthesia was administered and was found to be adequate. She was then placed in a dorsal supine position with a leftward tilt, and prepped and draped in a sterile manner.  A foley catheter was placed into her bladder and attached to constant gravity.  After an adequate timeout was performed, a vertical skin incision was made with scalpel and carried through to the underlying layer of fascia due to the reported history of severe scar tissue. The fascia was incised in the midline, and this incision was extended bilaterally using the Mayo scissors.  The rectus muscles were separated in the midline bluntly and the peritoneum was entered bluntly. Dense scar tissue was immediately noted of the uterine arteries, bladder and uterine wall to the abdominal wall throughout the lower 1/3 of the uterus.  A decision was made at that point to enter with a classical uterine incision.  This was done with a scalpel and extended with bandage scissors.  The infant was successfully delivered breech in the usual fashion with care to protect and flex the head during delivery.  The cord was clamped and cut and the infant was handed over to awaiting neonatology team. Uterine massage was then administered, and the placenta delivered intact with  a three-vessel cord.  The uterus was then cleared of clot and debris.  The hysterotomy was closed with 2-0 Vicryl in a running locked fashion, and an imbricating layer was also placed with 2-0 Vicryl.  A posterior puncture in the uterine wall was repaired with 2 figure of 8 sutures using 2-0 vicryl.  Attention was then turned to the fallopian tubes, about 3 cm from the cornua, with care given to incorporate the underlying mesosalpinx on both sides, allowing for bilateral tubal sterilization. The pelvis was cleared of all clot and debris. Hemostasis was confirmed on all surfaces.  Surgicel was placed on top of the uterine incision. The fascia was then closed using 0 PDS in a running fashion.  The subcutaneous layer was irrigated and 30 ml of 0.5% Marcaine was injected subcutaneously around the incision.  The skin was closed with a 3-0 Vicryl subcuticular stitch and steri strips. The patient tolerated the procedure well. Sponge, lap, instrument and needle counts were correct x 2.  She was taken to the recovery room in stable condition.   Daxtin Leiker, Redmond Baseman, MD

## 2014-03-02 NOTE — Anesthesia Procedure Notes (Signed)
Spinal  Patient location during procedure: OR Start time: 03/02/2014 11:57 AM Staffing Anesthesiologist: Sanah Kraska A. Performed by: anesthesiologist  Preanesthetic Checklist Completed: patient identified, site marked, surgical consent, pre-op evaluation, timeout performed, IV checked, risks and benefits discussed and monitors and equipment checked Spinal Block Patient position: sitting Prep: site prepped and draped and DuraPrep Patient monitoring: cardiac monitor, continuous pulse ox, blood pressure and heart rate Approach: midline Location: L3-4 Injection technique: catheter Needle Needle type: Tuohy and Sprotte  Needle gauge: 24 G Needle length: 12.7 cm Needle insertion depth: 6 cm Catheter type: closed end flexible Catheter size: 19 g Catheter at skin depth: 1 cm Assessment Sensory level: T3 Events: paresthesia Additional Notes Patient tolerated procedure well. Adequate sensory block. Attempt x 3. Good LOR each time but no CSF. Transient paresthesia right leg on 3rd attempt.

## 2014-03-02 NOTE — Anesthesia Postprocedure Evaluation (Signed)
  Anesthesia Post-op Note  Patient: [name] [name]  Procedure(s) Performed: Procedure(s): REPEAT CESAREAN SECTION WITH BILATERAL TUBAL LIGATION (Bilateral)  Patient Location: PACU  Anesthesia Type:Spinal  Level of Consciousness: awake, alert  and oriented  Airway and Oxygen Therapy: Patient Spontanous Breathing  Post-op Pain: none  Post-op Assessment: Post-op Vital signs reviewed, Patient's Cardiovascular Status Stable, Respiratory Function Stable, Patent Airway, No signs of Nausea or vomiting, Pain level controlled, No headache and No backache  Post-op Vital Signs: Reviewed and stable  Complications: No apparent anesthesia complications

## 2014-03-02 NOTE — Anesthesia Preprocedure Evaluation (Addendum)
Anesthesia Evaluation  Patient identified by MRN, date of birth, ID band Patient awake    Reviewed: Allergy & Precautions, H&P , NPO status , Patient's Chart, lab work & pertinent test results  Airway Mallampati: III TM Distance: >3 FB Neck ROM: Full    Dental no notable dental hx. (+) Teeth Intact   Pulmonary neg pulmonary ROS,  breath sounds clear to auscultation  Pulmonary exam normal       Cardiovascular hypertension, negative cardio ROS  Rhythm:Regular Rate:Normal  Hx/o PIH   Neuro/Psych  Headaches, PSYCHIATRIC DISORDERS Depression    GI/Hepatic Neg liver ROS, GERD-  Medicated and Controlled,  Endo/Other  Obesity  Renal/GU negative Renal ROS  negative genitourinary   Musculoskeletal negative musculoskeletal ROS (+)   Abdominal (+) + obese,   Peds  Hematology  (+) Sickle cell trait and anemia ,   Anesthesia Other Findings   Reproductive/Obstetrics (+) Pregnancy Previous C/Section x 5                          Anesthesia Physical Anesthesia Plan  ASA: III  Anesthesia Plan: Combined Spinal and Epidural   Post-op Pain Management:    Induction:   Airway Management Planned: Natural Airway  Additional Equipment:   Intra-op Plan:   Post-operative Plan:   Informed Consent: I have reviewed the patients History and Physical, chart, labs and discussed the procedure including the risks, benefits and alternatives for the proposed anesthesia with the patient or authorized representative who has indicated his/her understanding and acceptance.   Dental advisory given  Plan Discussed with: Anesthesiologist, CRNA and Surgeon  Anesthesia Plan Comments:         Anesthesia Quick Evaluation

## 2014-03-02 NOTE — Transfer of Care (Signed)
Immediate Anesthesia Transfer of Care Note  Patient: [name] JesterRegina [name]  Procedure(s) Performed: Procedure(s): REPEAT CESAREAN SECTION WITH BILATERAL TUBAL LIGATION (Bilateral)  Patient Location: PACU  Anesthesia Type:Spinal  Level of Consciousness: awake, alert  and oriented  Airway & Oxygen Therapy: Patient Spontanous Breathing  Post-op Assessment: Report given to PACU RN and Post -op Vital signs reviewed and stable  Post vital signs: Reviewed and stable  Complications: No apparent anesthesia complications

## 2014-03-03 LAB — CBC
HCT: 36.2 % (ref 36.0–46.0)
Hemoglobin: 12.4 g/dL (ref 12.0–15.0)
MCH: 27.7 pg (ref 26.0–34.0)
MCHC: 34.3 g/dL (ref 30.0–36.0)
MCV: 80.8 fL (ref 78.0–100.0)
PLATELETS: 202 10*3/uL (ref 150–400)
RBC: 4.48 MIL/uL (ref 3.87–5.11)
RDW: 14.7 % (ref 11.5–15.5)
WBC: 11.4 10*3/uL — AB (ref 4.0–10.5)

## 2014-03-03 MED ORDER — DIPHENHYDRAMINE HCL 25 MG PO CAPS: 25.0000 mg | ORAL_CAPSULE | ORAL | Status: DC | PRN

## 2014-03-03 MED ORDER — NALOXONE HCL 0.4 MG/ML IJ SOLN: 0.4000 mg | INTRAMUSCULAR | Status: DC | PRN

## 2014-03-03 MED ORDER — DIPHENHYDRAMINE HCL 50 MG/ML IJ SOLN
12.5000 mg | INTRAMUSCULAR | Status: DC | PRN
Start: 2014-03-03 — End: 2014-03-05

## 2014-03-03 MED ORDER — NALOXONE HCL 1 MG/ML IJ SOLN
1.0000 ug/kg/h | INTRAVENOUS | Status: DC | PRN
  Filled 2014-03-03: qty 2

## 2014-03-03 MED ORDER — NALBUPHINE HCL 10 MG/ML IJ SOLN
5.0000 mg | INTRAMUSCULAR | Status: DC | PRN
  Administered 2014-03-03: 10 mg via SUBCUTANEOUS
  Filled 2014-03-03: qty 1

## 2014-03-03 MED ORDER — METOCLOPRAMIDE HCL 5 MG/ML IJ SOLN: 10.0000 mg | Freq: Three times a day (TID) | INTRAMUSCULAR | Status: DC | PRN

## 2014-03-03 MED ORDER — DIPHENHYDRAMINE HCL 50 MG/ML IJ SOLN: 25.0000 mg | INTRAMUSCULAR | Status: DC | PRN

## 2014-03-03 MED ORDER — SODIUM CHLORIDE 0.9 % IJ SOLN: 3.0000 mL | INTRAMUSCULAR | Status: DC | PRN

## 2014-03-03 MED ORDER — NALBUPHINE HCL 10 MG/ML IJ SOLN: 5.0000 mg | INTRAMUSCULAR | Status: DC | PRN

## 2014-03-03 MED ORDER — ONDANSETRON HCL 4 MG/2ML IJ SOLN
4.0000 mg | Freq: Three times a day (TID) | INTRAMUSCULAR | Status: DC | PRN
Start: 2014-03-03 — End: 2014-03-05

## 2014-03-03 MED ORDER — SCOPOLAMINE 1 MG/3DAYS TD PT72
1.0000 | MEDICATED_PATCH | Freq: Once | TRANSDERMAL | Status: DC
  Filled 2014-03-03: qty 1

## 2014-03-03 NOTE — Progress Notes (Signed)
Subjective: Postpartum Day 1: Cesarean Delivery Patient reports incisional pain, tolerating PO and no problems voiding.    Objective: Vital signs in last 24 hours: Temp:  [97.4 F (36.3 C)-98.8 F (37.1 C)] 97.4 F (36.3 C) (03/22 0451) Pulse Rate:  [87-112] 101 (03/22 0451) Resp:  [18-24] 22 (03/22 0451) BP: (96-154)/(45-124) 112/72 mmHg (03/22 0451) SpO2:  [96 %-100 %] 100 % (03/22 0451) Weight:  [93.441 kg (206 lb)] 93.441 kg (206 lb) (03/21 1309)  Physical Exam:  General: alert, cooperative and no distress Lochia: appropriate Uterine Fundus: firm Incision: healing well, no significant drainage DVT Evaluation: No evidence of DVT seen on physical exam.   Recent Labs  03/01/14 1325 03/03/14 0556  HGB 12.8 12.4  HCT 37.6 36.2    Assessment/Plan: Status post Cesarean section. Doing well postoperatively.  Continue current care Foley and IV out today.  [facility] End[facility] CenterWILLIAMS,MARIE 03/03/2014, 7:48 AM

## 2014-03-03 NOTE — Addendum Note (Signed)
Addendum created 03/03/14 1010 by Turner DanielsJennifer L Edrik Rundle, CRNA   Modules edited: Charges VN, Notes Section   Notes Section:  File: [id number]

## 2014-03-03 NOTE — Lactation Note (Signed)
This note was copied from the chart of [name] [name]. Lactation Consultation Note    Follow up consultwith this mom of a term baby, now 25 hours post partum. Mom said she does not want to brest feed until "my pain is gone". I explained how this is a very important time to bresat feed, to establish her supply. She is aware she can pump with DEP. Mom will call if she decides to provide  breast milk for her baby.  Patient Name: [name] [name] [id number]'JToday's Date: 03/03/2014 Reason for consult: Follow-up assessment   Maternal Data Formula Feeding for Exclusion: Yes Reason for exclusion: Mother's choice to formula and breast feed on admission  Feeding    LATCH Score/Interventions                      Lactation Tools Discussed/Used     Consult Status Consult Status: Complete Follow-up type: Call as needed    Alfred LevinsLee, Sade Mehlhoff Anne 03/03/2014, 1:50 PM

## 2014-03-03 NOTE — Anesthesia Postprocedure Evaluation (Signed)
  Anesthesia Post-op Note  Anesthesia Post Note  Patient: [name] [name]  Procedure(s) Performed: Procedure(s) (LRB): REPEAT CESAREAN SECTION WITH BILATERAL TUBAL LIGATION (Bilateral)  Anesthesia type: Spinal  Patient location: Mother/Baby  Post pain: Pain level controlled  Post assessment: Post-op Vital signs reviewed  Last Vitals:  Filed Vitals:   03/03/14 0830  BP: 109/73  Pulse: 93  Temp: 36.6 C  Resp: 18    Post vital signs: Reviewed  Level of consciousness: awake  Complications: No apparent anesthesia complications

## 2014-03-04 ENCOUNTER — Encounter (HOSPITAL_COMMUNITY): Payer: Self-pay | Admitting: Obstetrics & Gynecology

## 2014-03-04 DIAGNOSIS — M79609 Pain in unspecified limb: Secondary | ICD-10-CM

## 2014-03-04 LAB — CBC
HEMATOCRIT: 31.7 % — AB (ref 36.0–46.0)
HEMOGLOBIN: 10.8 g/dL — AB (ref 12.0–15.0)
MCH: 27.4 pg (ref 26.0–34.0)
MCHC: 34.1 g/dL (ref 30.0–36.0)
MCV: 80.5 fL (ref 78.0–100.0)
Platelets: 224 10*3/uL (ref 150–400)
RBC: 3.94 MIL/uL (ref 3.87–5.11)
RDW: 14.7 % (ref 11.5–15.5)
WBC: 10 10*3/uL (ref 4.0–10.5)

## 2014-03-04 MED ORDER — HYDROXYZINE HCL 25 MG PO TABS
25.0000 mg | ORAL_TABLET | Freq: Three times a day (TID) | ORAL | Status: DC | PRN
  Administered 2014-03-04: 25 mg via ORAL
  Filled 2014-03-04: qty 1

## 2014-03-04 MED ORDER — DOCUSATE SODIUM 100 MG PO CAPS
100.0000 mg | ORAL_CAPSULE | Freq: Every day | ORAL | Status: DC
  Administered 2014-03-04 – 2014-03-05 (×2): 100 mg via ORAL
  Filled 2014-03-04 (×2): qty 1

## 2014-03-04 MED ORDER — BISACODYL 10 MG RE SUPP
10.0000 mg | Freq: Every day | RECTAL | Status: DC | PRN
  Administered 2014-03-04: 10 mg via RECTAL
  Filled 2014-03-04: qty 1

## 2014-03-04 MED ORDER — LACTATED RINGERS IV SOLN
INTRAVENOUS | Status: DC
Start: 2014-03-04 — End: 2014-03-05

## 2014-03-04 NOTE — Progress Notes (Signed)
*  Preliminary Results* Left lower extremity venous duplex completed. Left lower extremity is negative for deep vein thrombosis. There is no evidence of left Baker's cyst.  03/04/2014 8:37 AM  Gertie FeyMichelle Sharay Bellissimo, RVT, RDCS, RDMS

## 2014-03-04 NOTE — Progress Notes (Signed)
Talked to pt about nothing to eat or drink and ducolax suppository per rectum  Because she is not passing gas , soft distension and faint bowel sounds.  Pt states she understands information. We discussed venous doppler this AM.  Support is given.

## 2014-03-04 NOTE — Progress Notes (Signed)
Subjective: Postpartum Day 2: Cesarean Delivery Patient reports abdominal pain and negative flatus for the past two days. This pain has been refractory to her pain medications. She has been eating and drinking adequately. Additionally pt reports that she began experiencing left lower calf pain beginning last night while at rest. She denies any precipitating injury and has never had pain in her calf before.  No SOB, chest pain. Fevers.   Objective: Vital signs in last 24 hours: Temp:  [97.8 F (36.6 C)-98.6 F (37 C)] 98.6 F (37 C) (03/23 0645) Pulse Rate:  [86-108] 105 (03/23 0645) Resp:  [18] 18 (03/23 0645) BP: (95-112)/(62-80) 110/72 mmHg (03/23 0645) SpO2:  [97 %-98 %] 98 % (03/22 1230)  Physical Exam:  General: alert, cooperative and fatigued Lochia: appropriate Uterine Fundus: unable to be assessed d/t abdominal distention Incision: healing well, no significant drainage, no dehiscence, no significant erythema DVT Evaluation: No significant calf/ankle edema, however posterior calf tenderness present. Abdominal: Distended, hypoactive bowel sounds   Recent Labs  03/01/14 1325 03/03/14 0556  HGB 12.8 12.4  HCT 37.6 36.2    Assessment/Plan: Status post Cesarean section. Pt is experiencing posterior calf tenderness, however Homan's sign is negative. Unlikelly that the pt is experiencing a DVT however d/t risk factors we will evaluate this with US. Pt is also experiencing a likely ileus, clinically determined d/t her hypoactive bowel sounds, distention, and negative flatus after two days. Will put the pt on bowel rest starting now.    Unknown FoleyWinkel, Bradley T 03/04/2014, 7:41 AM  I have seen and examined this patient and agree with above documentation in the PA Student's note.   1) LLE calf pain - likely MSK but due to high pretest probability, will get LLE US today.   2) hypoactive bowel sounds and abd discomfort - concerning for developing ileus - make NPO for now - get up  and move around - dulcolax suppository.    Rulon AbideKeli Ovadia Lopp, M.D. [facility] Fellow 03/04/2014 8:05 AM

## 2014-03-04 NOTE — Progress Notes (Signed)
UR completed 

## 2014-03-04 NOTE — Progress Notes (Signed)
Discuss starting IVwith pt due to NPO. The pt states she had a BM(medium brown formed) and is passing gas. I explained NPO status and need to maintain her fluid balance. Pt's pain is improved to "3", bowel sounds a audible in all 4 quad.   At 1015 Dr Gayla DossJoyner notified of pt refusal of IV and updated status of BM, flatus, improved bowel sounds and decreased pain.

## 2014-03-05 ENCOUNTER — Encounter: Payer: Self-pay | Admitting: Obstetrics & Gynecology

## 2014-03-05 ENCOUNTER — Other Ambulatory Visit: Payer: Medicaid Other

## 2014-03-05 LAB — BASIC METABOLIC PANEL
BUN: 13 mg/dL (ref 6–23)
CHLORIDE: 103 meq/L (ref 96–112)
CO2: 23 meq/L (ref 19–32)
CREATININE: 0.96 mg/dL (ref 0.50–1.10)
Calcium: 8.7 mg/dL (ref 8.4–10.5)
GFR calc Af Amer: 90 mL/min — ABNORMAL LOW (ref >90)
GFR calc non Af Amer: 77 mL/min — ABNORMAL LOW (ref >90)
Glucose, Bld: 92 mg/dL (ref 70–99)
Potassium: 4.2 mEq/L (ref 3.7–5.3)
Sodium: 139 mEq/L (ref 137–147)

## 2014-03-05 LAB — CBC
HCT: 31.6 % — ABNORMAL LOW (ref 36.0–46.0)
HEMOGLOBIN: 10.8 g/dL — AB (ref 12.0–15.0)
MCH: 27.5 pg (ref 26.0–34.0)
MCHC: 34.2 g/dL (ref 30.0–36.0)
MCV: 80.4 fL (ref 78.0–100.0)
PLATELETS: 244 10*3/uL (ref 150–400)
RBC: 3.93 MIL/uL (ref 3.87–5.11)
RDW: 14.6 % (ref 11.5–15.5)
WBC: 8.3 10*3/uL (ref 4.0–10.5)

## 2014-03-05 LAB — TYPE AND SCREEN
ABO/RH(D): B POS
ANTIBODY SCREEN: NEGATIVE
UNIT DIVISION: 0
Unit division: 0

## 2014-03-05 MED ORDER — SERTRALINE HCL 50 MG PO TABS: 50.0000 mg | ORAL_TABLET | Freq: Every day | ORAL | Status: DC

## 2014-03-05 MED ORDER — DSS 100 MG PO CAPS: 100.0000 mg | ORAL_CAPSULE | Freq: Every day | ORAL | Status: DC

## 2014-03-05 MED ORDER — IBUPROFEN 600 MG PO TABS
600.0000 mg | ORAL_TABLET | Freq: Four times a day (QID) | ORAL | Status: DC
Start: 2014-03-05 — End: 2014-05-11

## 2014-03-05 MED ORDER — OXYCODONE-ACETAMINOPHEN 5-325 MG PO TABS: 1.0000 | ORAL_TABLET | ORAL | Status: DC | PRN

## 2014-03-05 NOTE — Discharge Summary (Signed)
Physician Obstetric Discharge Summary  Patient ID: [name] [name] MRN: [id number] DOB/AGE: [birth year] 33 y.o.  Reason for Admission: cesarean section Prenatal Procedures: none Intrapartum Procedures: tubal ligation and Repeat Cesarean Section, Primary classical uterine incision with vertical skin incision, Bilateral Tubal Sterilization using Filshie clips Postpartum Procedures: none Complications-Operative and Postpartum: LLE edema and Ileus  Delivery Note At 12:20 PM a healthy female was delivered via C-Section, Classical (Presentation: ;  ).  APGAR: 8, 9; weight 5 lb 8.2 oz (2500 g).   Placenta status: Intact, Manual removal.  Cord: 3 vessels with the following complications: .    Est. Blood Loss (500mL):   Mom to postpartum.  Baby to Couplet care / Skin to Skin.  H/H:  Lab Results  Component Value Date/Time   HGB 10.8* 03/05/2014  8:26 AM   HCT 31.6* 03/05/2014  8:26 AM    Brief Hospital Course: [name] [name] is a [id number] who underwent cesarean section on 03/02/2014.  Patient's surgery was complicated by dense scar tissue requiring conversion to classical uterine incision; for further details of this surgery, please refer to the operative note.  Patient's postpartum course was complicated by left calf pain and GI ileus. She was made NPO until she had BM on POD#2, and diet advance afterwards; She was tolerating full diet and voiding normally on discharge. Her calf pain was evaluated with US and was negative for DVT. By time of discharge on POD/PPD#3, her pain was controlled on oral pain medications; she had appropriate lochia and was ambulating, voiding without difficulty, tolerating regular diet and passing flatus.   She was deemed stable for discharge to home.  She expressed some depressed mood on day of discharge; She denied SI/SA and felt that she could be safely discharged home to care for herself and baby. Zoloft was started, and will be adjusted as  Discharge Diagnoses: Term  Pregnancy-delivered and BTL & Postpartum blues with hx of depression/PTSD  Discharge Information: Date: 03/05/2014 Activity: pelvic rest Diet: routine Baby feeding: plans to breastfeed, plans to bottle feed Contraception: bilateral tubal ligation Medications: Ibuprofen and Percocet Discharged Condition: good Instructions: refer to practice specific booklet Discharge to: home  Signed: Wenda LowJames Joyner, M.D. FM PGY-1  03/05/2014, 12:13 PM  Evaluation and management procedures were performed by Resident physician under my supervision/collaboration. Chart reviewed, patient examined by me and I agree with management and plan.

## 2014-03-05 NOTE — Progress Notes (Signed)
Subjective: Postpartum Day 3: Cesarean Delivery and BTL Patient reports continued abdominal pain that is improving; She is passing flatus and tolerating PO well. She has been eating and drinking adequately. Also continues to report left medial ankle pain that is unchanged from yesterday. She denies any precipitating injury and has never had pain in her calf before. She reports some trouble breath, but admits to not taking deep breaths due to increasing abdominal pain.  No chest pain or Fevers.   Objective: Vital signs in last 24 hours: Temp:  [97.1 F (36.2 C)-98.8 F (37.1 C)] 98.1 F (36.7 C) (03/24 0554) Pulse Rate:  [96-102] 98 (03/24 0554) Resp:  [18] 18 (03/24 0554) BP: (112-120)/(69-78) 114/69 mmHg (03/24 0554) SpO2:  [99 %-100 %] 99 % (03/23 1005)  Physical Exam:  General: alert, cooperative, no distress and mildly obese. Lochia: appropriate Uterine Fundus: Firm and tenderness at the umbilicus Incision: healing well, no significant drainage, no dehiscence, no significant erythema DVT Evaluation: 2+ pitting edema bilaterally; No cords, calf tenderness or calf pain with ankle dorsiflexion. Medial left ankle tenderness and achilles pain w/ ankle dorsiflexion  Abdominal: Distended, active bowel sounds; mild voluntary guarding   Recent Labs  03/03/14 0556 03/04/14 0940  HGB 12.4 10.8*  HCT 36.2 31.7*    Assessment/Plan: Status post Cesarean section day 3.   - Bottle feeding  1) Left ankle pain - likely MSK; LE dopplers negative for DVT or Backer's cyst - 2+ Pitting edema b/l L > R; Continues to report good UOP  2) Abdominal pain - Improved after dulcolax suppository yesterday - Uterus firm at the umbilicus  - Tolerating PO well  - Bleeding appropriate; Hgb 10.8 < 12.4  Wenda LowJoyner, James 03/05/2014, 7:43 AM  I have seen and examined this patient and agree with above documentation in the resident's note. PT with continued complaints of more pain with inspiration.  Not  tacycardic. Hgb this AM stable from before.  Good UOP. Exam benign.  Likely irritation from surgery. Abdominal pain improving although not resolved.  Cont to monitor today and likely d/c this pm.    Rulon AbideKeli Jazzalyn Loewenstein, M.D. [facility] 03/05/2014 8:56 AM

## 2014-03-05 NOTE — Discharge Instructions (Signed)
Postpartum Care After Cesarean Delivery  After you deliver your newborn (postpartum period), the usual stay in the hospital is 24 72 hours. If there were problems with your labor or delivery, or if you have other medical problems, you might be in the hospital longer.   While you are in the hospital, you will receive help and instructions on how to care for yourself and your newborn during the postpartum period.   While you are in the hospital:  · It is normal for you to have pain or discomfort from the incision in your abdomen. Be sure to tell your nurses when you are having pain, where the pain is located, and what makes the pain worse.  · If you are breastfeeding, you may feel uncomfortable contractions of your uterus for a couple of weeks. This is normal. The contractions help your uterus get back to normal size.  · It is normal to have some bleeding after delivery.  · For the first 1 3 days after delivery, the flow is red and the amount may be similar to a period.  · It is common for the flow to start and stop.  · In the first few days, you may pass some small clots. Let your nurses know if you begin to pass large clots or your flow increases.  · Do not  flush blood clots down the toilet before having the nurse look at them.  · During the next 3 10 days after delivery, your flow should become more watery and pink or brown-tinged in color.  · Ten to fourteen days after delivery, your flow should be a small amount of yellowish-white discharge.  · The amount of your flow will decrease over the first few weeks after delivery. Your flow may stop in 6 8 weeks. Most women have had their flow stop by 12 weeks after delivery.  · You should change your sanitary pads frequently.  · Wash your hands thoroughly with soap and water for at least 20 seconds after changing pads, using the toilet, or before holding or feeding your newborn.  · Your intravenous (IV) tubing will be removed when you are drinking enough fluids.  · The  urine drainage tube (urinary catheter) that was inserted before delivery may be removed within 6 8 hours after delivery or when feeling returns to your legs. You should feel like you need to empty your bladder within the first 6 8 hours after the catheter has been removed.  · In case you become weak, lightheaded, or faint, call your nurse before you get out of bed for the first time and before you take a shower for the first time.  · Within the first few days after delivery, your breasts may begin to feel tender and full. This is called engorgement. Breast tenderness usually goes away within 48 72 hours after engorgement occurs. You may also notice milk leaking from your breasts. If you are not breastfeeding, do not stimulate your breasts. Breast stimulation can make your breasts produce more milk.  · Spending as much time as possible with your newborn is very important. During this time, you and your newborn can feel close and get to know each other. Having your newborn stay in your room (rooming in) will help to strengthen the bond with your newborn. It will give you time to get to know your newborn and become comfortable caring for your newborn.  · Your hormones change after delivery. Sometimes the hormone changes can temporarily cause   you to feel sad or tearful. These feelings should not last more than a few days. If these feelings last longer than that, you should talk to your caregiver.  · If desired, talk to your caregiver about methods of family planning or contraception.  · Talk to your caregiver about immunizations. Your caregiver may want you to have the following immunizations before leaving the hospital:  · Tetanus, diphtheria, and pertussis (Tdap) or tetanus and diphtheria (Td) immunization. It is very important that you and your family (including grandparents) or others caring for your newborn are up-to-date with the Tdap or Td immunizations. The Tdap or Td immunization can help protect your newborn  from getting ill.  · Rubella immunization.  · Varicella (chickenpox) immunization.  · Influenza immunization. You should receive this annual immunization if you did not receive the immunization during your pregnancy.  Document Released: 08/23/2012 Document Reviewed: 08/23/2012  ExitCare® Patient Information ©2014 [employer].  Postpartum Depression and Baby Blues  The postpartum period begins right after the birth of a baby. During this time, there is often a great amount of joy and excitement. It is also a time of considerable changes in the life of the parent(s). Regardless of how many times a mother gives birth, each child brings new challenges and dynamics to the family. It is not unusual to have feelings of excitement accompanied by confusing shifts in moods, emotions, and thoughts. All mothers are at risk of developing postpartum depression or the "baby blues." These mood changes can occur right after giving birth, or they may occur many months after giving birth. The baby blues or postpartum depression can be mild or severe. Additionally, postpartum depression can resolve rather quickly, or it can be a long-term condition.  CAUSES  Elevated hormones and their rapid decline are thought to be a main cause of postpartum depression and the baby blues. There are a number of hormones that radically change during and after pregnancy. Estrogen and progesterone usually decrease immediately after delivering your baby. The level of thyroid hormone and various cortisol steroids also rapidly drop. Other factors that play a major role in these changes include major life events and genetics.   RISK FACTORS  If you have any of the following risks for the baby blues or postpartum depression, know what symptoms to watch out for during the postpartum period. Risk factors that may increase the likelihood of getting the baby blues or postpartum depression include:  · Having a personal or family history of depression.  · Having  depression while being pregnant.  · Having premenstrual or oral contraceptive-associated mood issues.  · Having exceptional life stress.  · Having marital conflict.  · Lacking a social support network.  · Having a baby with special needs.  · Having health problems such as diabetes.  SYMPTOMS  Baby blues symptoms include:  · Brief fluctuations in mood, such as going from extreme happiness to sadness.  · Decreased concentration.  · Difficulty sleeping.  · Crying spells, tearfulness.  · Irritability.  · Anxiety.  Postpartum depression symptoms typically begin within the first month after giving birth. These symptoms include:  · Difficulty sleeping or excessive sleepiness.  · Marked weight loss.  · Agitation.  · Feelings of worthlessness.  · Lack of interest in activity or food.  Postpartum psychosis is a very concerning condition and can be dangerous. Fortunately, it is rare. Displaying any of the following symptoms is cause for immediate medical attention. Postpartum psychosis symptoms include:  · Hallucinations   and delusions.  · Bizarre or disorganized behavior.  · Confusion or disorientation.  DIAGNOSIS   A diagnosis is made by an evaluation of your symptoms. There are no medical or lab tests that lead to a diagnosis, but there are various questionnaires that a caregiver may use to identify those with the baby blues, postpartum depression, or psychosis. Often times, a screening tool called the Edinburgh Postnatal Depression Scale is used to diagnose depression in the postpartum period.   TREATMENT  The baby blues usually goes away on its own in 1 to 2 weeks. Social support is often all that is needed. You should be encouraged to get adequate sleep and rest. Occasionally, you may be given medicines to help you sleep.   Postpartum depression requires treatment as it can last several months or longer if it is not treated. Treatment may include individual or group therapy, medicine, or both to address any social,  physiological, and psychological factors that may play a role in the depression. Regular exercise, a healthy diet, rest, and social support may also be strongly recommended.   Postpartum psychosis is more serious and needs treatment right away. Hospitalization is often needed.  HOME CARE INSTRUCTIONS  · Get as much rest as you can. Nap when the baby sleeps.  · Exercise regularly. Some women find yoga and walking to be beneficial.  · Eat a balanced and nourishing diet.  · Do little things that you enjoy. Have a cup of tea, take a bubble bath, read your favorite magazine, or listen to your favorite music.  · Avoid alcohol.  · Ask for help with household chores, cooking, grocery shopping, or running errands as needed. Do not try to do everything.  · Talk to people close to you about how you are feeling. Get support from your partner, family members, friends, or other new moms.  · Try to stay positive in how you think. Think about the things you are grateful for.  · Do not spend a lot of time alone.  · Only take medicine as directed by your caregiver.  · Keep all your postpartum appointments.  · Let your caregiver know if you have any concerns.  SEEK MEDICAL CARE IF:  You are having a reaction or problems with your medicine.  SEEK IMMEDIATE MEDICAL CARE IF:  · You have suicidal feelings.  · You feel you may harm the baby or someone else.  Document Released: 09/02/2004 Document Revised: 02/21/2012 Document Reviewed: 09/10/2013  ExitCare® Patient Information ©2014 [employer].

## 2014-03-06 ENCOUNTER — Other Ambulatory Visit (HOSPITAL_COMMUNITY): Payer: Medicaid Other

## 2014-03-06 NOTE — Clinical Social Work Maternal (Addendum)
LATE ENTRY FROM 03/05/14:  Clinical Social Work Department  PSYCHOSOCIAL ASSESSMENT - MATERNAL/CHILD  03/06/2014  Patient: [name] [name] Account Number: [id number] Admit Date: 03/02/2014  Childs Name:  [name] [name]   Clinical Social Worker: Avira Tillison, LCSW Date/Time: 03/05/2014 09:11 AM  Date Referred: 03/05/2014  Referral source   CN    Referred reason   Behavioral Health Issues   Other referral source:  I: FAMILY / HOME ENVIRONMENT  Child's legal guardian: PARENT  Guardian - Name  Guardian - Age  Guardian - Address   [name] [name]  32  [address]; [place], [place] [address]   [name] [name]  30  (same as above)   Other household support members/support persons  Name  Relationship  DOB    SON  12 years old    DAUGHTER  10 years old   son  SON  8 years old    DAUGHTER  6 years old    SON  4 years old   Other support:  II PSYCHOSOCIAL DATA  Information Source: Patient Interview  Financial and Community Resources  Employment:  Financial resources: Private Insurance  If Medicaid - County: [place]  School / Grade:  Maternity Care Coordinator / Child Services Coordination / Early Interventions: Cultural issues impacting care:  III STRENGTHS  Strengths   Adequate Resources   Home prepared for Child (including basic supplies)   Supportive family/friends   Strength comment:  IV RISK FACTORS AND CURRENT PROBLEMS  Current Problem: YES  Risk Factor & Current Problem  Patient Issue  Family Issue  Risk Factor / Current Problem Comment   Other - See comment  Y  N  PTSD & hx of major depression   V SOCIAL WORK ASSESSMENT  CSW referral received to assess pts history of PTSD & major depression diagnoses. Prior to CSW entering the room, pt shared with the pediatrician that she felt "hopeless." Pt acknowledged the history of PTSD, as she was diagnosed after her grandmother was murdered in 1990. She states she was always dealt with depression & was diagnosed with major  depression in 2013. Pt was not able to verbalized the sources of her depression. She reports feelings of depression during the pregnancy. Pt has prescribed medication in the past however she never took it. She participated in counseling for a while but told CSW that her depression symptoms increase when she talks about her past. She denies any SI/SHI. Pt has been married for 1 year & states her spouse treats her & children very well. CSW inquired about her expression of "hopeless" & asked pt her reason(s) for feeling that way. Pt explained that she was very nervous in the operating room after the MD told her all the risk of her getting pregnant again & having another cesarean (7th). Therefore, it was recommended that pt have a tubal. Now, pt is regretting her decision to have the procedure & feeling "hopeless" about the fact that she will not have anymore children. CSW validated pts feelings & encouraged her to try counseling services again. Pt agrees. CSW provided pt with some options for low cost counseling services & pt seemed appreciative. She has all the necessary supplies for the infant & adequate family support. CSW available to assist further. No barriers to discharge identified.   VI SOCIAL WORK PLAN  Social Work Plan   No Further Intervention Required / No Barriers to Discharge   Type of pt/family education:  If child protective services report - county:  If   child protective services report - date:  Information/referral to community resources comment:  [facility]   [facility]   Other social work plan:     

## 2014-03-14 ENCOUNTER — Encounter (HOSPITAL_COMMUNITY): Payer: Self-pay | Admitting: *Deleted

## 2014-03-14 ENCOUNTER — Inpatient Hospital Stay (HOSPITAL_COMMUNITY)
Admission: AD | Admit: 2014-03-14 | Discharge: 2014-03-14 | Disposition: A | Discharge status: Home / Self Care | Payer: Medicaid Other | Source: Ambulatory Visit | Attending: Obstetrics & Gynecology | Admitting: Obstetrics & Gynecology

## 2014-03-14 DIAGNOSIS — O9989 Other specified diseases and conditions complicating pregnancy, childbirth and the puerperium: Secondary | ICD-10-CM

## 2014-03-14 DIAGNOSIS — O909 Complication of the puerperium, unspecified: Secondary | ICD-10-CM | POA: Insufficient documentation

## 2014-03-14 DIAGNOSIS — Z22330 Carrier of Group B streptococcus: Secondary | ICD-10-CM | POA: Insufficient documentation

## 2014-03-14 DIAGNOSIS — O9081 Anemia of the puerperium: Secondary | ICD-10-CM | POA: Insufficient documentation

## 2014-03-14 DIAGNOSIS — O99893 Other specified diseases and conditions complicating puerperium: Secondary | ICD-10-CM | POA: Insufficient documentation

## 2014-03-14 DIAGNOSIS — Z5189 Encounter for other specified aftercare: Secondary | ICD-10-CM

## 2014-03-14 DIAGNOSIS — D573 Sickle-cell trait: Secondary | ICD-10-CM | POA: Insufficient documentation

## 2014-03-14 NOTE — MAU Provider Note (Signed)
Attestation of Attending Supervision of Advanced Practitioner (CNM/NP): Evaluation and management procedures were performed by the Advanced Practitioner under my supervision and collaboration.  I have reviewed the Advanced Practitioner's note and chart, and I agree with the management and plan.  HARRAWAY-SMITH, Joie Reamer 3:36 PM

## 2014-03-14 NOTE — MAU Note (Signed)
Scheduled c/s 03/21.  Having real bad pain at incision site and it there is bleeding.

## 2014-03-14 NOTE — MAU Provider Note (Signed)
None     Chief Complaint:  Post-op Problem   [name] [name] is  33 y.o. [id number].  Here for evaluation of incision after having bleeding from incision site. Pt is POD#12 after a classical incision. Pt states that she had a small amount of bleeding that was contained within bandage. No f/c, no inc in vaginal bleeding, no increase in pain. No n/v, or inability to tolerate PO  OB History   Grav Para Term Preterm Abortions TAB SAB Ect Mult Living   7 6 6  1  1   6        Past Medical History  Diagnosis Date  . Sickle cell trait   . Pregnant   . Hypertension     Hx PIH with 2002 preg, no current problems  . Depression     PTSD (1997) major depression (2013) - no meds  . Migraine     otc med prn    Past Surgical History  Procedure Laterality Date  . Cesarean section      x 6  . Wisdom tooth extraction    . Cesarean section with bilateral tubal ligation Bilateral 03/02/2014    Procedure: REPEAT CESAREAN SECTION WITH BILATERAL TUBAL LIGATION;  Surgeon: Allie Bossier, MD;  Location: [facility];  Service: Obstetrics;  Laterality: Bilateral;    Family History  Problem Relation Age of Onset  . Hypertension Mother   . Hypertension Father   . Diabetes Father     History  Substance Use Topics  . Smoking status: Never Smoker   . Smokeless tobacco: Never Used  . Alcohol Use: No     Comment: ocassionaly before pregnancy    Allergies:  Allergies  Allergen Reactions  . Fish Allergy Rash    Any kind of seafood  . Penicillins Swelling and Rash    Pt. Says Amoxicillin is ok.    Prescriptions prior to admission  Medication Sig Dispense Refill  . ibuprofen (ADVIL,MOTRIN) 600 MG tablet Take 1 tablet (600 mg total) by mouth every 6 (six) hours.  30 tablet  0  . oxyCODONE-acetaminophen (PERCOCET/ROXICET) 5-325 MG per tablet Take 1-2 tablets by mouth every 4 (four) hours as needed for severe pain (moderate - severe pain).  30 tablet  0  . Prenatal Vit-Fe Fumarate-FA (PRENATAL MULTIVITAMIN)  TABS tablet Take 1 tablet by mouth daily at 12 noon.      . promethazine (PHENERGAN) 25 MG tablet Take 1 tablet (25 mg total) by mouth every 6 (six) hours as needed for nausea.  30 tablet  2     Review of Systems  Review of Systems  Constitutional: Negative for fever, chills, weight loss, malaise/fatigue and diaphoresis.  HENT: Negative for hearing loss, ear pain, nosebleeds, congestion, sore throat, neck pain, tinnitus and ear discharge.   Eyes: Negative for blurred vision, double vision, photophobia, pain, discharge and redness.  Respiratory: Negative for cough, hemoptysis, sputum production, shortness of breath, wheezing and stridor.   Cardiovascular: Negative for chest pain, palpitations, orthopnea,  leg swelling  Gastrointestinal: Negative for abdominal pain heartburn, nausea, vomiting, diarrhea, constipation, blood in stool Genitourinary: Negative for dysuria, urgency, frequency, hematuria and flank pain.  Musculoskeletal: Negative for myalgias, back pain, joint pain and falls.  Skin: Negative for itching and rash.  Neurological: Negative for dizziness, tingling, tremors, sensory change, speech change, focal weakness, seizures, loss of consciousness, weakness and headaches.  Endo/Heme/Allergies: Negative for environmental allergies and polydipsia. Does not bruise/bleed easily.  Psychiatric/Behavioral: Negative for depression, suicidal ideas, hallucinations,  memory loss and substance abuse. The patient is not nervous/anxious and does not have insomnia.      Physical Exam   Blood pressure 131/86, pulse 85, temperature 98.7 F (37.1 C), temperature source Oral, resp. rate 18, last menstrual period 06/09/2013, not currently breastfeeding.  General: General appearance - alert, well appearing, and in no distress, oriented to person, place, and time and well hydrated Chest - no tachypnea, retractions or cyanosis Abdomen - C/d/i incision with 2 small scabs at the umbilicus along incision.  Pt is appropriately tender.  Extremities - peripheral pulses normal, no pedal edema, no clubbing or cyanosis   Labs: No results found for this or any previous visit (from the past 24 hour(s)). Imaging Studies:  [place] Ob Follow Up  02/28/2014   OBSTETRICAL ULTRASOUND: This exam was performed within a [facility]. The OB US report was generated in the AS system, and faxed to the ordering physician.   This report is also available in [facility] [employer] and in the [employer] [employer] PACS. See AS Obstetric US report.  [place] Fetal Bpp W/o Non Stress  02/28/2014   OBSTETRICAL ULTRASOUND: This exam was performed within a [facility]. The OB US report was generated in the AS system, and faxed to the ordering physician.   This report is also available in [facility] [employer] and in the [facility] [employer] PACS. See AS Obstetric US report.    Assessment: Patient Active Problem List   Diagnosis Date Noted  . S/P C-section 03/02/2014  . 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 Sickle cell trait 10/01/2013  . Supervision of normal subsequent pregnancy 09/25/2013  . Previous cesarean delivery, antepartum condition or complication 09/25/2013   [name] [name] is a 33 y.o. [id number] at 347w0d POD#12 with minimal postop bleeding that has ceased. Will discharge home and follow up in clinic. Pt  Has appt next week. Pt also encouraged to wear abdominal binder to take tension off of incision. No evidence of infections. Discharge home.  Irwin Toran, Audie ClearMICHAEL RYAN

## 2014-03-14 NOTE — MAU Note (Addendum)
Increased blood on dressing noted past 2 days.  Did not call office.  Has honeycomb  And transparent dressing on - is not bleeding through.

## 2014-03-14 NOTE — Discharge Instructions (Signed)
Wound Care Wound care helps prevent pain and infection.  You may need a tetanus shot if:  You cannot remember when you had your last tetanus shot.  You have never had a tetanus shot.  The injury broke your skin. If you need a tetanus shot and you choose not to have one, you may get tetanus. Sickness from tetanus can be serious. HOME CARE   Only take medicine as told by your doctor.  Clean the wound daily with mild soap and water.  Change any bandages (dressings) as told by your doctor.  Put medicated cream and a bandage on the wound as told by your doctor.  Change the bandage if it gets wet, dirty, or starts to smell.  Take showers. Do not take baths, swim, or do anything that puts your wound under water.  Rest and raise (elevate) the wound until the pain and puffiness (swelling) are better.  Keep all doctor visits as told. GET HELP RIGHT AWAY IF:   Yellowish-white fluid (pus) comes from the wound.  Medicine does not lessen your pain.  There is a red streak going away from the wound.  You have a fever. MAKE SURE YOU:   Understand these instructions.  Will watch your condition.  Will get help right away if you are not doing well or get worse. Document Released: 09/07/2008 Document Revised: 02/21/2012 Document Reviewed: 04/04/2011 [employer] Patient Information 2014 [employer].  

## 2014-03-21 NOTE — MAU Provider Note (Signed)
Chart reviewed, agree with above note.  [name] [name] S [name] [name] 03/21/2014 10:38 AM

## 2014-04-03 ENCOUNTER — Encounter (HOSPITAL_COMMUNITY): Payer: Self-pay | Admitting: Emergency Medicine

## 2014-04-03 ENCOUNTER — Emergency Department (HOSPITAL_COMMUNITY)
Admission: EM | Admit: 2014-04-03 | Discharge: 2014-04-03 | Disposition: A | Discharge status: Nursing Home (Includes ICF) | Payer: Medicaid Other | Source: Home / Self Care | Attending: Family Medicine | Admitting: Family Medicine

## 2014-04-03 DIAGNOSIS — J029 Acute pharyngitis, unspecified: Secondary | ICD-10-CM

## 2014-04-03 DIAGNOSIS — T391X1A Poisoning by 4-Aminophenol derivatives, accidental (unintentional), initial encounter: Secondary | ICD-10-CM

## 2014-04-03 DIAGNOSIS — Y9229 Other specified public building as the place of occurrence of the external cause: Secondary | ICD-10-CM

## 2014-04-03 LAB — SALICYLATE LEVEL: Salicylate Lvl: <2 mg/dL — ABNORMAL LOW (ref 2.8–20.0)

## 2014-04-03 LAB — ACETAMINOPHEN LEVEL: Acetaminophen (Tylenol), Serum: <15 ug/mL (ref 10–30)

## 2014-04-03 LAB — POCT RAPID STREP A: STREPTOCOCCUS, GROUP A SCREEN (DIRECT): NEGATIVE

## 2014-04-03 NOTE — ED Notes (Signed)
Pt  Has  Had   approx  4  Oz  Of  Tylenol   Extra  strenth           Today   Within      5-6  Hours          This  Would        Amount  To  About       4-5  gms  Of  Tylenol          Pt     Was  Unsure          Of  How  Much  To  Take  But  She  Presented  With   An  8  Oz  Bottle  Which     Is    approx      1/2  Full      She  Does  Not  Seem  To    Understand   Dosing      Instructions   Pt   Informed  That    She   Needs  To  Be       Checked

## 2014-04-03 NOTE — Discharge Instructions (Signed)
Your test for strep throat was negative. Your may use tylenol as directed on packaging for discomfort and warm salt water gargles. If you feel symptoms are related to seasonal allergies, your may take Claritin as directed on packaging. This medication is safe in breastfeeding.   Salt Water Gargle This solution will help make your mouth and throat feel better. HOME CARE INSTRUCTIONS   Mix 1 teaspoon of salt in 8 ounces of warm water.  Gargle with this solution as much or often as you need or as directed. Swish and gargle gently if you have any sores or wounds in your mouth.  Do not swallow this mixture. Document Released: 09/02/2004 Document Revised: 02/21/2012 Document Reviewed: 01/24/2009 [facility] Patient Information 2014 [place], [place].  Pharyngitis Pharyngitis is redness, pain, and swelling (inflammation) of your pharynx.  CAUSES  Pharyngitis is usually caused by infection. Most of the time, these infections are from viruses (viral) and are part of a cold. However, sometimes pharyngitis is caused by bacteria (bacterial). Pharyngitis can also be caused by allergies. Viral pharyngitis may be spread from person to person by coughing, sneezing, and personal items or utensils (cups, forks, spoons, toothbrushes). Bacterial pharyngitis may be spread from person to person by more intimate contact, such as kissing.  SIGNS AND SYMPTOMS  Symptoms of pharyngitis include:   Sore throat.   Tiredness (fatigue).   Low-grade fever.   Headache.  Joint pain and muscle aches.  Skin rashes.  Swollen lymph nodes.  Plaque-like film on throat or tonsils (often seen with bacterial pharyngitis). DIAGNOSIS  Your health care provider will ask you questions about your illness and your symptoms. Your medical history, along with a physical exam, is often all that is needed to diagnose pharyngitis. Sometimes, a rapid strep test is done. Other lab tests may also be done, depending on the suspected cause.   TREATMENT  Viral pharyngitis will usually get better in 3 4 days without the use of medicine. Bacterial pharyngitis is treated with medicines that kill germs (antibiotics).  HOME CARE INSTRUCTIONS   Drink enough water and fluids to keep your urine clear or pale yellow.   Only take over-the-counter or prescription medicines as directed by your health care provider:   If you are prescribed antibiotics, make sure you finish them even if you start to feel better.   Do not take aspirin.   Get lots of rest.   Gargle with 8 oz of salt water ( tsp of salt per 1 qt of water) as often as every 1 2 hours to soothe your throat.   Throat lozenges (if you are not at risk for choking) or sprays may be used to soothe your throat. SEEK MEDICAL CARE IF:   You have large, tender lumps in your neck.  You have a rash.  You cough up green, yellow-brown, or bloody spit. SEEK IMMEDIATE MEDICAL CARE IF:   Your neck becomes stiff.  You drool or are unable to swallow liquids.  You vomit or are unable to keep medicines or liquids down.  You have severe pain that does not go away with the use of recommended medicines.  You have trouble breathing (not caused by a stuffy nose). MAKE SURE YOU:   Understand these instructions.  Will watch your condition.  Will get help right away if you are not doing well or get worse. Document Released: 11/29/2005 Document Revised: 09/19/2013 Document Reviewed: 08/06/2013 [facility] Patient Information 2014 [place]Care, [place].

## 2014-04-03 NOTE — ED Provider Notes (Signed)
CSN: [id number]     Arrival date & time 04/03/14  1105 History   First MD Initiated Contact with Patient 04/03/14 1306     Chief Complaint  Patient presents with  . Sore Throat   (Consider location/radiation/quality/duration/timing/severity/associated sxs/prior Treatment) HPI Comments: No fever  Patient is a 33 y.o. female presenting with pharyngitis. The history is provided by the patient.  Sore Throat This is a new problem. The current episode started yesterday. The problem occurs constantly. The problem has not changed since onset.Pertinent negatives include no chest pain, no abdominal pain, no headaches and no shortness of breath.    Past Medical History  Diagnosis Date  . Sickle cell trait   . Pregnant   . Hypertension     Hx PIH with 2002 preg, no current problems  . Depression     PTSD (1997) major depression (2013) - no meds  . Migraine     otc med prn   Past Surgical History  Procedure Laterality Date  . Cesarean section      x 6  . Wisdom tooth extraction    . Cesarean section with bilateral tubal ligation Bilateral 03/02/2014    Procedure: REPEAT CESAREAN SECTION WITH BILATERAL TUBAL LIGATION;  Surgeon: Allie BossierMyra C Dove, MD;  Location: [facility];  Service: Obstetrics;  Laterality: Bilateral;   Family History  Problem Relation Age of Onset  . Hypertension Mother   . Hypertension Father   . Diabetes Father    History  Substance Use Topics  . Smoking status: Never Smoker   . Smokeless tobacco: Never Used  . Alcohol Use: No     Comment: ocassionaly before pregnancy   OB History   Grav Para Term Preterm Abortions TAB SAB Ect Mult Living   7 6 6  1  1   6      Review of Systems  Respiratory: Negative for shortness of breath.   Cardiovascular: Negative for chest pain.  Gastrointestinal: Negative for abdominal pain.  Neurological: Negative for headaches.  All other systems reviewed and are negative.   Allergies  Fish allergy and Penicillins  Home Medications    Prior to Admission medications   Medication Sig Start Date End Date Taking? Authorizing Provider  ibuprofen (ADVIL,MOTRIN) 600 MG tablet Take 1 tablet (600 mg total) by mouth every 6 (six) hours. 03/05/14   Wenda LowJames Joyner, MD  oxyCODONE-acetaminophen (PERCOCET/ROXICET) 5-325 MG per tablet Take 1-2 tablets by mouth every 4 (four) hours as needed for severe pain (moderate - severe pain). 03/05/14   Wenda LowJames Joyner, MD  Prenatal Vit-Fe Fumarate-FA (PRENATAL MULTIVITAMIN) TABS tablet Take 1 tablet by mouth daily at 12 noon.    Historical Provider, MD  promethazine (PHENERGAN) 25 MG tablet Take 1 tablet (25 mg total) by mouth every 6 (six) hours as needed for nausea. 01/23/14   Minta BalsamMichael R Odom, MD   BP 132/83  Pulse 76  Temp(Src) 97.9 F (36.6 C) (Oral)  Resp 16  SpO2 100% Physical Exam  Nursing note and vitals reviewed. Constitutional: She is oriented to person, place, and time. She appears well-developed and well-nourished. No distress.  HENT:  Head: Normocephalic and atraumatic.  Right Ear: Hearing, tympanic membrane, external ear and ear canal normal.  Left Ear: Hearing, tympanic membrane, external ear and ear canal normal.  Nose: Nose normal.  Mouth/Throat: Uvula is midline, oropharynx is clear and moist and mucous membranes are normal. No oral lesions. No trismus in the jaw. No uvula swelling or dental caries.  Eyes: Conjunctivae  are normal. Right eye exhibits no discharge. Left eye exhibits no discharge. No scleral icterus.  Neck: Normal range of motion. Neck supple. No thyromegaly present.  Cardiovascular: Normal rate, regular rhythm and normal heart sounds.   Pulmonary/Chest: Effort normal and breath sounds normal.  Abdominal: Soft. Bowel sounds are normal. She exhibits no distension. There is no tenderness.  Musculoskeletal: Normal range of motion.  Lymphadenopathy:    She has no cervical adenopathy.  Neurological: She is alert and oriented to person, place, and time.  Skin: Skin is  warm and dry. No rash noted. No erythema.  Psychiatric: She has a normal mood and affect. Her behavior is normal.    ED Course  Procedures (including critical care time) Labs Review Labs Reviewed  POCT RAPID STREP A ([facility] CARE ONLY)   Imaging Review No results found.   MDM   1. Sore throat    Sore Throat: exam unremarkable. Rapid strep negative.  Will advise symptomatic care at home.   Jess BartersJennifer Lee Cherry HillPresson, GeorgiaPA 04/03/14 1407  Addendum 04-03-2014 @ 2:17pm: went to review discharge instructions with patient and found her drinking liquid tylenol cough and cold syrup directly from the bottle x 2 while I was in exam room. When I asked how often she was drinking doses from bottle she stated "all morning." I asked her when she purchased the bottle of medication and she advised it was newly purchased this morning. I examined bottle and about 5 ounces of product appeared to have been consumed over the course of the last 4-6 hours. Each 15ml of product contains 500mg  of acetaminophen which roughly translates to consumption of 5 grams of acetaminophen which is an overdose. [facility]. Acetaminophen and salicylate levels drawn prior to transfer.   Jess BartersJennifer Lee SyracusePresson, [place]PA 04/03/14 1426

## 2014-04-03 NOTE — ED Notes (Signed)
Pt  reports  Symptoms  Of  A  sorethroat  With  A  Headache      Symptoms     Began yesterday         Pt   Appears  In no  Severe  Distress  Speaking in  Complete  sentances  And  Is  Sitting  Upright  On the  Exam table  Speaking in  Complete  sentances  And  Is  In no acute  Distress

## 2014-04-05 LAB — CULTURE, GROUP A STREP

## 2014-04-05 NOTE — ED Provider Notes (Signed)
Medical screening examination/treatment/procedure(s) were performed by non-physician practitioner and as supervising physician I was immediately available for consultation/collaboration.  Coye Dawood, M.D.  Soriyah Osberg C Bradlee Heitman, MD 04/05/14 1333 

## 2014-04-08 ENCOUNTER — Telehealth: Payer: Self-pay | Admitting: General Practice

## 2014-04-08 ENCOUNTER — Encounter: Payer: Self-pay | Admitting: General Practice

## 2014-04-08 ENCOUNTER — Ambulatory Visit: Payer: Medicaid Other | Admitting: Obstetrics & Gynecology

## 2014-04-08 NOTE — Telephone Encounter (Signed)
Patient no showed for appt today. Called patient, no answer- left message that we see you missed your appt with us today, please call our front office staff so we can get your appt rescheduled. Will send letter

## 2014-04-11 ENCOUNTER — Telehealth: Payer: Self-pay

## 2014-04-11 NOTE — Telephone Encounter (Addendum)
Per Tresa EndoKelly-- pt. Scored 20 on PP depression scale [facility] A week after that visit, [facility], pt. Verbalized having suicidal ideations. Pt. [facility] Attempted to call pt. To advise her to go to Emergency room. No answer. Left message stating we are trying to reach you, it is important, please call clinic at your earliest convenience.   Pt. Returned call. I informed pt. We had been contacted by [facility] who had expressed concern for Pt.'s well being. Asked pt. How she was feeling. Pt. Stated "I've been depressed and down. And this time it is just different. Lately I have been so down, so sad, having thoughts that I wished I was dead." I asked pt. If she had thought of hurting herself. Pt. Hesitated, then stated "honestly, yeah. Yes." I advised pt. It would be best for her to go to [facility]U to be seen so that we may get her on medication to help her so that she is no longer having those thoughts. Informed pt. It is important for her to be seen prior to Monday.  Pt. Verbalized understanding and stated that she would go to [facility]U today.

## 2014-04-14 ENCOUNTER — Emergency Department (HOSPITAL_COMMUNITY)
Admission: EM | Admit: 2014-04-14 | Discharge: 2014-04-14 | Disposition: A | Discharge status: Home / Self Care | Payer: Medicaid Other

## 2014-04-14 NOTE — ED Notes (Signed)
Pt called x 2 with no answer  

## 2014-04-14 NOTE — ED Notes (Signed)
Name called x 2 - no answer 

## 2014-04-14 NOTE — ED Notes (Signed)
Called for pt with no answer 

## 2014-04-15 ENCOUNTER — Ambulatory Visit (INDEPENDENT_AMBULATORY_CARE_PROVIDER_SITE_OTHER): Payer: Medicaid Other | Admitting: Nurse Practitioner

## 2014-04-15 ENCOUNTER — Encounter: Payer: Self-pay | Admitting: Nurse Practitioner

## 2014-04-15 VITALS — BP 118/78 | HR 92 | Temp 98.4°F | Ht 64.0 in | Wt 183.4 lb

## 2014-04-15 DIAGNOSIS — E669 Obesity, unspecified: Secondary | ICD-10-CM

## 2014-04-15 DIAGNOSIS — F329 Major depressive disorder, single episode, unspecified: Secondary | ICD-10-CM

## 2014-04-15 DIAGNOSIS — F3289 Other specified depressive episodes: Secondary | ICD-10-CM

## 2014-04-15 DIAGNOSIS — Z9851 Tubal ligation status: Secondary | ICD-10-CM

## 2014-04-15 DIAGNOSIS — F32A Depression, unspecified: Secondary | ICD-10-CM | POA: Insufficient documentation

## 2014-04-15 NOTE — Progress Notes (Signed)
Patient ID: [name] [name], female   DOB: July 15, 1981, 33 y.o.   MRN: [id number] Subjective:     [name] JesterRegina Givans is a 33 y.o. female who presents for a postpartum visit. She is 6 week postpartum following a fundal vertical Cesarean section. I have fully reviewed the prenatal and intrapartum course. The delivery was at 39 gestational weeks. Outcome: primary cesarean section, low vertical incision. Anesthesia: epidural. Postpartum course has been eventful for depression, she had some suicide ideations and denies that now. She was advised to go to [facility]U and did not go. She does not want medications or social services help. She states she "feels guilty" about having BTL.  [facility] and had 20 on PP depression scale, today it is 11 and she says she feels better and has no more suicide ideations . Baby's course has been uneventful. Baby is feeding by bottle Rush Barer- Gerber. Bleeding no bleeding. Bowel function is normal. Bladder function is normal. Patient is not sexually active. Contraception method is tubal ligation. Postpartum depression screening: positive.  The following portions of the patient's history were reviewed and updated as appropriate: allergies, past family history, past medical history, past social history, past surgical history and problem list.  Review of Systems Pertinent items are noted in HPI.   Objective:    BP 118/78  Pulse 92  Temp(Src) 98.4 F (36.9 C) (Oral)  Ht 5\' 4"  (1.626 m)  Wt 183 lb 6.4 oz (83.19 kg)  BMI 31.47 kg/m2  Breastfeeding? No  General:  alert   Breasts:  inspection negative, no nipple discharge or bleeding, no masses or nodularity palpable  Lungs: clear to auscultation bilaterally  Heart:  regular rate and rhythm, S1, S2 normal, no murmur, click, rub or gallop  Abdomen: soft, non-tender; bowel sounds normal; no masses,  no organomegaly incision is well healed   Vulva:  not evaluated  Vagina: not evaluated  Cervix:  not examined  Corpus: not examined   Adnexa:  not evaluated  Rectal Exam: Not performed.        Assessment:    postpartum exam. Pap smear not done at today's visit.   Plan:    1. Contraception: tubal ligation 2. Post partum depression/ declines all help 3. [facility] or ED for mental health issues

## 2014-04-15 NOTE — Patient Instructions (Signed)
Postpartum Tubal Ligation Care After Refer to this sheet in the next few weeks. These instructions provide you with information on caring for yourself after your procedure. Your caregiver may also give you more specific instructions. Your treatment has been planned according to current medical practices, but problems sometimes occur. Call your caregiver if you have any problems or questions after your procedure. HOME CARE INSTRUCTIONS   Rest the remainder of the day.  Only take over-the-counter or prescription medicines for pain, discomfort, or fever as directed by your caregiver. Do not take aspirin. It can cause bleeding.  Gradually resume daily activities, diet, rest, driving, and work.  Avoid sexual intercourse for 2 weeks or as directed.  Do not drive while taking pain medicine.  Do not lift anything over 5 pounds for 2 weeks or as directed.  Only take showers, not baths, until you are seen by your caregiver.  Change bandages (dressings) as directed.  Take your temperature twice a day and record it.  Try to have help for the first 7 10 days for your household needs.  Return to your caregiver to get your stitches (sutures) removed and for follow-up visits as directed. SEEK MEDICAL CARE IF:   You have redness, swelling, or increasing pain in the wound.  You have drainage from the wound lasting longer than 1 day.  Your pain is getting worse.  You have a rash.  You become dizzy or lightheaded.  You have a reaction to your medicine.  You need stronger medicine or a change in your pain medicine.  You notice a bad smell coming from the wound or dressing.  Your wound breaks open after the sutures have been removed.  You are constipated. SEEK IMMEDIATE MEDICAL CARE IF:   You faint.  You have a fever.  You have increasing abdominal pain.  You have severe pain in your shoulders.  You have bleeding or drainage from the suture sites or vagina following surgery.  You  have shortness of breath or difficulty breathing.  You have chest or leg pain.  You have persistent nausea, vomiting, or diarrhea. MAKE SURE YOU:   Understand these instructions.  Watch your condition.  Get help right away if you are not doing well or get worse. Document Released: 05/30/2012 Document Reviewed: 05/30/2012 [facility] Patient Information 2014 [place]ExitCare, MarylandLLC.

## 2014-04-26 ENCOUNTER — Ambulatory Visit (HOSPITAL_COMMUNITY)
Admission: AD | Admit: 2014-04-26 | Discharge: 2014-04-26 | Disposition: A | Discharge status: Home / Self Care | Payer: Medicaid Other | Attending: Psychiatry | Admitting: Psychiatry

## 2014-04-26 NOTE — [facility] Counselor (Signed)
2040: consulted with Alberteen SamFran Hobson, NP about PT:  recommended outpatient and provide outpatient referral information, crisis information, and advise to return immediately or to nearest ED if experiencing thoughts of harming self, children, or others.

## 2014-04-26 NOTE — BH Assessment (Signed)
Assessment Note  Elon JesterRegina Riggio is an 33 y.o. female.  The PT [facility] because her baby's Pediatrician and Social Worker recommended that she be assessed.  PT reported feeling sad and guilty about the death of her 2 youngest children Father for the past 4 mos.  She reported not having an appetite and losing 10 lbs in 2 wks.  PT reported getting 2 hrs of sleep per night.  The PT denied current SI and reported thinking a week ago "I shouldn't be here" and then thinking "my children need me."  PT denied HI, AH, and VH.  PT reported not wanting inpt services and that [facility]c the Pediatrician and Social Worker told her to.  The PT reported her Spouse and Mother are supportive of her and the children.  The PT was given written information on outpatient services at local providers, local crisis contact information, and advised that if she starts experiencing thoughts about hurting herself, children, or others to call 911 or come to the nearest ED.    Axis I: Major Depressive Disorder Axis II: Deferred Axis IV: Bereavement  Axis V: 61-70 mild symptoms  Past Medical History:  Past Medical History  Diagnosis Date  . Sickle cell trait   . Pregnant   . Hypertension     Hx PIH with 2002 preg, no current problems  . Depression     PTSD (1997) major depression (2013) - no meds  . Migraine     otc med prn    Past Surgical History  Procedure Laterality Date  . Cesarean section      x 6  . Wisdom tooth extraction    . Cesarean section with bilateral tubal ligation Bilateral 03/02/2014    Procedure: REPEAT CESAREAN SECTION WITH BILATERAL TUBAL LIGATION;  Surgeon: Allie BossierMyra C Dove, MD;  Location: [facility] ORS;  Service: Obstetrics;  Laterality: Bilateral;    Family History:  Family History  Problem Relation Age of Onset  . Hypertension Mother   . Hypertension Father   . Diabetes Father     Social History:  reports that she has never smoked. She has never used smokeless tobacco. She  reports that she does not drink alcohol or use illicit drugs.  Additional Social History:     CIWA:   COWS:    Allergies:  Allergies  Allergen Reactions  . Fish Allergy Rash    Any kind of seafood  . Penicillins Swelling and Rash    Pt. Says Amoxicillin is ok.    Home Medications:  (Not in a hospital admission)  OB/GYN Status:  No LMP recorded.  General Assessment Data Location of Assessment: [facility] Assessment Services ACT Assessment: Yes Is this a Tele or Face-to-Face Assessment?: Face-to-Face Is this an Initial Assessment or a Re-assessment for this encounter?: Initial Assessment Living Arrangements: Spouse/significant other;Children Can pt return to current living arrangement?: Yes Admission Status: Voluntary Is patient capable of signing voluntary admission?: Yes Transfer from: Home Referral Source: Other ([facility])  Medical Screening Exam [facility] Walk-in ONLY) Medical Exam completed: No Reason for MSE not completed: Other: (PT was a walk-in)  [facility] Living Arrangements: Spouse/significant other;Children Name of Psychiatrist: None Name of Therapist: None  Education Status Is patient currently in school?: No Current Grade: N/A Highest grade of school patient has completed: N/A Name of school: N/A Contact person: N/A  Risk to self Suicidal Ideation: No-Not Currently/Within Last 6 Months Suicidal Intent: No Is patient at  risk for suicide?: No Suicidal Plan?: No Access to Means: No What has been your use of drugs/alcohol within the last 12 months?: No Previous Attempts/Gestures: No How many times?: 0 Other Self Harm Risks: None Triggers for Past Attempts: None known Intentional Self Injurious Behavior: None Family Suicide History: Unknown Recent stressful life event(s): Loss (Comment) (Children's Father died 6 mos ago) Persecutory voices/beliefs?: No (PT denied) Depression: Yes Depression Symptoms: Guilt (Sadness) Substance abuse  history and/or treatment for substance abuse?: No (PT denied) Suicide prevention information given to non-admitted patients: Yes  Risk to Others Homicidal Ideation: No (PT denied against children) Thoughts of Harm to Others: No (PT denied against children) Current Homicidal Intent: No Current Homicidal Plan: No Access to Homicidal Means: No Identified Victim: None History of harm to others?: No Assessment of Violence: None Noted Violent Behavior Description: None Does patient have access to weapons?: No Criminal Charges Pending?: No Does patient have a court date: No  Psychosis Hallucinations: None noted (PT denied) Delusions: None noted (PT denied)  Mental Status Report Appear/Hygiene: Unremarkable (PT clean and neat) Eye Contact: Fair Motor Activity: Unremarkable Speech: Unremarkable Level of Consciousness: Alert Mood: Guilty;Sad Affect: Flat;Depressed Anxiety Level: Minimal Thought Processes: Coherent Judgement: Unimpaired Orientation: Person;Place;Time;Situation Obsessive Compulsive Thoughts/Behaviors: None  Cognitive Functioning Concentration: Normal Memory: Recent Intact;Remote Intact IQ: Average Insight: Good Impulse Control: Good Appetite: Poor Weight Loss: 10 Weight Gain: 0 Sleep: Decreased Total Hours of Sleep: 2 Vegetative Symptoms: None  ADLScreening [facility] Assessment Services) Patient's cognitive ability adequate to safely complete daily activities?: Yes Patient able to express need for assistance with ADLs?: Yes Independently performs ADLs?: Yes (appropriate for developmental age)  Prior Inpatient Therapy Prior Inpatient Therapy: No Prior Therapy Dates: N/A Prior Therapy Facilty/Provider(s): N./A Reason for Treatment: N/A  Prior Outpatient Therapy Prior Outpatient Therapy: No ([facility] and [facility] night) Prior Therapy Dates: None Prior Therapy Facilty/Provider(s): N/A Reason for Treatment: PTSD and Major Depression  ADL  Screening (condition at time of admission) Patient's cognitive ability adequate to safely complete daily activities?: Yes Is the patient deaf or have difficulty hearing?: No Does the patient have difficulty seeing, even when wearing glasses/contacts?: No Does the patient have difficulty concentrating, remembering, or making decisions?: No Patient able to express need for assistance with ADLs?: Yes Does the patient have difficulty dressing or bathing?: No Independently performs ADLs?: Yes (appropriate for developmental age) Does the patient have difficulty walking or climbing stairs?: No Weakness of Legs: None Weakness of Arms/Hands: None  Home Assistive Devices/Equipment Home Assistive Devices/Equipment: None    Abuse/Neglect Assessment (Assessment to be complete while patient is alone) Physical Abuse: Denies Verbal Abuse: Denies Sexual Abuse: Denies Exploitation of patient/patient's resources: Denies Self-Neglect: Denies Values / Beliefs Cultural Requests During Hospitalization: None Spiritual Requests During Hospitalization: None        Additional Information 1:1 In Past 12 Months?: No CIRT Risk: No Elopement Risk: No Does patient have medical clearance?: No (PT decline medical exam)     Disposition:  Disposition Initial Assessment Completed for this Encounter: Yes Disposition of Patient: Outpatient treatment Type of outpatient treatment: Adult  On Site Evaluation by:   Reviewed with Physician:    Sherilyn CooterIvy Dey-Johnson 04/26/2014 9:10 PM

## 2014-04-27 ENCOUNTER — Encounter: Payer: Self-pay | Admitting: *Deleted

## 2014-05-10 ENCOUNTER — Encounter (HOSPITAL_COMMUNITY): Payer: Self-pay | Admitting: Emergency Medicine

## 2014-05-10 DIAGNOSIS — Z862 Personal history of diseases of the blood and blood-forming organs and certain disorders involving the immune mechanism: Secondary | ICD-10-CM | POA: Insufficient documentation

## 2014-05-10 DIAGNOSIS — R51 Headache: Secondary | ICD-10-CM | POA: Insufficient documentation

## 2014-05-10 DIAGNOSIS — I10 Essential (primary) hypertension: Secondary | ICD-10-CM | POA: Insufficient documentation

## 2014-05-10 DIAGNOSIS — Z880 Allergy status to penicillin: Secondary | ICD-10-CM | POA: Insufficient documentation

## 2014-05-10 DIAGNOSIS — Z79899 Other long term (current) drug therapy: Secondary | ICD-10-CM | POA: Insufficient documentation

## 2014-05-10 DIAGNOSIS — Z8669 Personal history of other diseases of the nervous system and sense organs: Secondary | ICD-10-CM | POA: Insufficient documentation

## 2014-05-10 DIAGNOSIS — Z8659 Personal history of other mental and behavioral disorders: Secondary | ICD-10-CM | POA: Insufficient documentation

## 2014-05-10 DIAGNOSIS — R079 Chest pain, unspecified: Secondary | ICD-10-CM | POA: Insufficient documentation

## 2014-05-10 DIAGNOSIS — E669 Obesity, unspecified: Secondary | ICD-10-CM | POA: Insufficient documentation

## 2014-05-10 LAB — BASIC METABOLIC PANEL
BUN: 10 mg/dL (ref 6–23)
CHLORIDE: 108 meq/L (ref 96–112)
CO2: 24 meq/L (ref 19–32)
Calcium: 8.5 mg/dL (ref 8.4–10.5)
Creatinine, Ser: 0.82 mg/dL (ref 0.50–1.10)
GFR calc Af Amer: >90 mL/min (ref >90)
GFR calc non Af Amer: >90 mL/min (ref >90)
GLUCOSE: 104 mg/dL — AB (ref 70–99)
Potassium: 4 mEq/L (ref 3.7–5.3)
SODIUM: 142 meq/L (ref 137–147)

## 2014-05-10 LAB — CBC
HCT: 34.4 % — ABNORMAL LOW (ref 36.0–46.0)
Hemoglobin: 11.7 g/dL — ABNORMAL LOW (ref 12.0–15.0)
MCH: 27.9 pg (ref 26.0–34.0)
MCHC: 34 g/dL (ref 30.0–36.0)
MCV: 82.1 fL (ref 78.0–100.0)
Platelets: 289 10*3/uL (ref 150–400)
RBC: 4.19 MIL/uL (ref 3.87–5.11)
RDW: 14.7 % (ref 11.5–15.5)
WBC: 6.6 10*3/uL (ref 4.0–10.5)

## 2014-05-10 LAB — I-STAT TROPONIN, ED: TROPONIN I, POC: 0.01 ng/mL (ref 0.00–0.08)

## 2014-05-10 NOTE — ED Notes (Signed)
Patient called x3, no response, appears patient LWBS before

## 2014-05-10 NOTE — ED Notes (Signed)
Pt called x2, no response, will attempt again in 5 minutes.

## 2014-05-10 NOTE — ED Notes (Signed)
Pt presents with a headache and central chest pain x2 days.

## 2014-05-10 NOTE — ED Notes (Signed)
Patient came up to the nurse first desk, asked if she had been called yet, that she had walked outside. Informed patient that she had been called 3 times, and we thought she left. Informed patient that we would have her triaged immediately.

## 2014-05-11 ENCOUNTER — Emergency Department (HOSPITAL_COMMUNITY)
Admission: EM | Admit: 2014-05-11 | Discharge: 2014-05-11 | Disposition: A | Discharge status: Home / Self Care | Payer: Medicaid Other | Attending: Emergency Medicine | Admitting: Emergency Medicine

## 2014-05-11 DIAGNOSIS — R079 Chest pain, unspecified: Secondary | ICD-10-CM

## 2014-05-11 DIAGNOSIS — R51 Headache: Secondary | ICD-10-CM

## 2014-05-11 DIAGNOSIS — R519 Headache, unspecified: Secondary | ICD-10-CM

## 2014-05-11 MED ORDER — METOCLOPRAMIDE HCL 5 MG/ML IJ SOLN
10.0000 mg | Freq: Once | INTRAMUSCULAR | Status: DC
  Filled 2014-05-11: qty 2

## 2014-05-11 MED ORDER — METOCLOPRAMIDE HCL 10 MG PO TABS
10.0000 mg | ORAL_TABLET | Freq: Once | ORAL | Status: AC
  Administered 2014-05-11: 10 mg via ORAL
  Filled 2014-05-11: qty 1

## 2014-05-11 MED ORDER — KETOROLAC TROMETHAMINE 15 MG/ML IJ SOLN
15.0000 mg | Freq: Once | INTRAMUSCULAR | Status: DC
  Filled 2014-05-11: qty 1

## 2014-05-11 MED ORDER — IBUPROFEN 400 MG PO TABS
600.0000 mg | ORAL_TABLET | Freq: Once | ORAL | Status: AC
  Administered 2014-05-11: 600 mg via ORAL
  Filled 2014-05-11 (×2): qty 1

## 2014-05-11 NOTE — Discharge Instructions (Signed)
Return if you develop shortness of breath or worsening chest pain. Followup with her primary care doctor outpatient. Realize we're unable to check you for signs of blood clots as he refused blood work and further evaluation. If you change your mind return to the ER see a physician.  If you were given medicines take as directed.  If you are on coumadin or contraceptives realize their levels and effectiveness is altered by many different medicines.  If you have any reaction (rash, tongues swelling, other) to the medicines stop taking and see a physician.   Please follow up as directed and return to the ER or see a physician for new or worsening symptoms.  Thank you. Filed Vitals:   05/10/14 2248 05/11/14 0214  BP: 112/71   Pulse: 76   Temp: 97.6 F (36.4 C)   TempSrc: Oral   Resp: 16   SpO2: 95% 95%

## 2014-05-11 NOTE — ED Notes (Addendum)
Entered room to start IV and collect blood sample. Patient currently refusing IV placement and administration of IV medications. MD Zavitz aware. Patient agreed to blood draw. Patient aware if D-dimer result is positive she will require IV for further evaluation. Patient continues to refuse IV placement.

## 2014-05-11 NOTE — ED Provider Notes (Signed)
CSN: [id number]     Arrival date & time 05/10/14  2121 History   First MD Initiated Contact with Patient 05/11/14 0158     Chief Complaint  Patient presents with  . Chest Pain  . Headache     (Consider location/radiation/quality/duration/timing/severity/associated sxs/prior Treatment) HPI Comments: 33 year old female with high blood pressure, tubal ligation, obesity presents with headache and right-sided chest ache for the past day and half. Headache is gradual onset with photophobia and patient has migraine history. No neck stiffness or fevers. Chest discomfort is right upper and radiates the right upper shoulder with mild shortness of breath that resolve last night.Patient denies blood clot history, active cancer, recent major trauma unilateral leg swelling/ pain, recent long travel, hemoptysis or oral contraceptives.  Patient did have C-section 2 months ago. No known heart problems. Currently no chest pain. Chest pain last 2-3 minutes at a time at times worse with movement and at times shortness of breath. No exertional symptoms   Patient is a 33 y.o. female presenting with chest pain and headaches. The history is provided by the patient.  Chest Pain Associated symptoms: headache   Associated symptoms: no abdominal pain, no back pain, no fever, no numbness, no shortness of breath, not vomiting and no weakness   Headache Associated symptoms: no abdominal pain, no back pain, no congestion, no fever, no neck pain, no neck stiffness, no numbness and no vomiting     Past Medical History  Diagnosis Date  . Sickle cell trait   . Pregnant   . Hypertension     Hx PIH with 2002 preg, no current problems  . Depression     PTSD (1997) major depression (2013) - no meds  . Migraine     otc med prn   Past Surgical History  Procedure Laterality Date  . Cesarean section      x 6  . Wisdom tooth extraction    . Cesarean section with bilateral tubal ligation Bilateral 03/02/2014    Procedure:  REPEAT CESAREAN SECTION WITH BILATERAL TUBAL LIGATION;  Surgeon: Allie Bossier, MD;  Location: [facility];  Service: Obstetrics;  Laterality: Bilateral;   Family History  Problem Relation Age of Onset  . Hypertension Mother   . Hypertension Father   . Diabetes Father    History  Substance Use Topics  . Smoking status: Never Smoker   . Smokeless tobacco: Never Used  . Alcohol Use: No     Comment: ocassionaly before pregnancy   OB History   Grav Para Term Preterm Abortions TAB SAB Ect Mult Living   7 6 6  1  1   6      Review of Systems  Constitutional: Negative for fever and chills.  HENT: Negative for congestion.   Eyes: Negative for visual disturbance.  Respiratory: Negative for shortness of breath.   Cardiovascular: Positive for chest pain. Negative for leg swelling.  Gastrointestinal: Negative for vomiting and abdominal pain.  Genitourinary: Negative for dysuria and flank pain.  Musculoskeletal: Negative for back pain, neck pain and neck stiffness.  Skin: Negative for rash.  Neurological: Positive for headaches. Negative for weakness and numbness.      Allergies  Fish allergy and Penicillins  Home Medications   Prior to Admission medications   Medication Sig Start Date End Date Taking? Authorizing Provider  ibuprofen (ADVIL,MOTRIN) 600 MG tablet Take 1 tablet (600 mg total) by mouth every 6 (six) hours. 03/05/14   Wenda Low, MD  Prenatal Vit-Fe Fumarate-FA (PRENATAL  MULTIVITAMIN) TABS tablet Take 1 tablet by mouth daily at 12 noon.    Historical Provider, MD   BP 112/71  Pulse 76  Temp(Src) 97.6 F (36.4 C) (Oral)  Resp 16  SpO2 95% Physical Exam  Nursing note and vitals reviewed. Constitutional: She is oriented to person, place, and time. She appears well-developed and well-nourished.  HENT:  Head: Normocephalic and atraumatic.  Eyes: Conjunctivae are normal. Right eye exhibits no discharge. Left eye exhibits no discharge.  Neck: Normal range of motion. Neck  supple. No tracheal deviation present.  Cardiovascular: Normal rate and regular rhythm.   Pulmonary/Chest: Effort normal and breath sounds normal.  Abdominal: Soft. She exhibits no distension. There is no tenderness. There is no guarding.  Musculoskeletal: She exhibits no edema.  Neurological: She is alert and oriented to person, place, and time. GCS eye subscore is 4. GCS verbal subscore is 5. GCS motor subscore is 6.  5+ strength in UE and LE with f/e at major joints. Sensation to palpation intact in UE and LE. CNs 2-12 grossly intact.  EOMFI.  PERRL.   Finger nose and coordination intact bilateral.   Visual fields intact to finger testing.   Skin: Skin is warm. No rash noted.  Psychiatric: She has a normal mood and affect.    ED Course  Procedures (including critical care time) Labs Review Labs Reviewed  CBC - Abnormal; Notable for the following:    Hemoglobin 11.7 (*)    HCT 34.4 (*)    All other components within normal limits  BASIC METABOLIC PANEL - Abnormal; Notable for the following:    Glucose, Bld 104 (*)    All other components within normal limits  D-DIMER, QUANTITATIVE  I-STAT TROPOININ, ED    Imaging Review No results found.   EKG Interpretation   Date/Time:  Friday May 10 2014 22:35:32 EDT Ventricular Rate:  82 PR Interval:  114 QRS Duration: 94 QT Interval:  354 QTC Calculation: 413 R Axis:   9 Text Interpretation:  Normal sinus rhythm Left ventricular hypertrophy  Nonspecific T wave abnormality Abnormal ECG overall similar previous  Confirmed by Danah Reinecke  MD, Charmelle Soh ([id number]) on 05/11/2014 2:30:12 AM      MDM   Final diagnoses:  Chest pain  Headache   Patient low risk cardiac and low risk pulmonary embolism. With surgery 2 months ago and mild shortness of breath with chest pain patient low risk so d-dimer ordered. Patient well-appearing overall in ED. EKG reviewed no acute findings. Headache overall migraine-like gradual onset no signs of  meningitis. Normal neuro exam. Plan for migraine cocktail  So patient despite multiple attempts to obtain blood work refused. Patient says she does not have a blood clot and does not want further testing at this time. Capacity to make decisions and realizes that we are unable to rule in or out a blood clot as the cause of her symptoms which can cause her significant morbidity and mortality. Her only risk factor is the surgery 2 months prior patient does have normal vital signs and other workup unremarkable. Patient low risk cardiac with 2 negative troponins and can followup outpatient for stress test if pain resumes. No symptoms on discharge. Patient refusing workup of blood clot AGAINST MEDICAL ADVICE. Results and differential diagnosis were discussed with the patient/parent/guardian. Close follow up outpatient was discussed, comfortable with the plan.   Filed Vitals:   05/10/14 2248 05/11/14 0214  BP: 112/71   Pulse: 76   Temp: 97.6 F (36.4  C)   TempSrc: Oral   Resp: 16   SpO2: 95% 95%        Enid Skeens, MD 05/11/14 [id number]

## 2014-05-11 NOTE — ED Notes (Signed)
Per Phlebotomist: Immediately after puncturing skin with needle, patient demanded needle be removed.

## 2014-07-30 ENCOUNTER — Encounter (HOSPITAL_COMMUNITY): Payer: Self-pay | Admitting: Emergency Medicine

## 2014-07-30 DIAGNOSIS — Z3202 Encounter for pregnancy test, result negative: Secondary | ICD-10-CM | POA: Insufficient documentation

## 2014-07-30 DIAGNOSIS — R071 Chest pain on breathing: Secondary | ICD-10-CM | POA: Insufficient documentation

## 2014-07-30 DIAGNOSIS — I10 Essential (primary) hypertension: Secondary | ICD-10-CM | POA: Insufficient documentation

## 2014-07-30 DIAGNOSIS — Z862 Personal history of diseases of the blood and blood-forming organs and certain disorders involving the immune mechanism: Secondary | ICD-10-CM | POA: Insufficient documentation

## 2014-07-30 DIAGNOSIS — R079 Chest pain, unspecified: Secondary | ICD-10-CM | POA: Insufficient documentation

## 2014-07-30 DIAGNOSIS — Z880 Allergy status to penicillin: Secondary | ICD-10-CM | POA: Insufficient documentation

## 2014-07-30 DIAGNOSIS — Z8659 Personal history of other mental and behavioral disorders: Secondary | ICD-10-CM | POA: Insufficient documentation

## 2014-07-30 LAB — I-STAT TROPONIN, ED: TROPONIN I, POC: 0 ng/mL (ref 0.00–0.08)

## 2014-07-30 LAB — CBC
HCT: 37.4 % (ref 36.0–46.0)
HEMOGLOBIN: 12.5 g/dL (ref 12.0–15.0)
MCH: 27.6 pg (ref 26.0–34.0)
MCHC: 33.4 g/dL (ref 30.0–36.0)
MCV: 82.6 fL (ref 78.0–100.0)
Platelets: 307 10*3/uL (ref 150–400)
RBC: 4.53 MIL/uL (ref 3.87–5.11)
RDW: 13.1 % (ref 11.5–15.5)
WBC: 7.8 10*3/uL (ref 4.0–10.5)

## 2014-07-30 LAB — BASIC METABOLIC PANEL
Anion gap: 12 (ref 5–15)
BUN: 10 mg/dL (ref 6–23)
CO2: 25 meq/L (ref 19–32)
Calcium: 9 mg/dL (ref 8.4–10.5)
Chloride: 102 mEq/L (ref 96–112)
Creatinine, Ser: 1.02 mg/dL (ref 0.50–1.10)
GFR calc Af Amer: 83 mL/min — ABNORMAL LOW (ref >90)
GFR calc non Af Amer: 71 mL/min — ABNORMAL LOW (ref >90)
GLUCOSE: 99 mg/dL (ref 70–99)
POTASSIUM: 4.3 meq/L (ref 3.7–5.3)
Sodium: 139 mEq/L (ref 137–147)

## 2014-07-30 LAB — PRO B NATRIURETIC PEPTIDE: PRO B NATRI PEPTIDE: 7.6 pg/mL (ref 0–125)

## 2014-07-30 NOTE — ED Notes (Signed)
The patient said she started having chest pain last night at about 0200.  She said she had her kids and did not have anyone to take care of them.  The patient said she thought the pain would go away and it didn't so she decided to come in.  She rates her pain 9/10.

## 2014-07-31 ENCOUNTER — Emergency Department (HOSPITAL_COMMUNITY)
Admission: EM | Admit: 2014-07-31 | Discharge: 2014-07-31 | Disposition: A | Discharge status: Home / Self Care | Payer: Medicaid Other | Attending: Emergency Medicine | Admitting: Emergency Medicine

## 2014-07-31 DIAGNOSIS — R071 Chest pain on breathing: Secondary | ICD-10-CM

## 2014-07-31 DIAGNOSIS — R0789 Other chest pain: Secondary | ICD-10-CM

## 2014-07-31 LAB — POC URINE PREG, ED: PREG TEST UR: NEGATIVE

## 2014-07-31 MED ORDER — IBUPROFEN 200 MG PO TABS
ORAL_TABLET | ORAL | Status: AC
  Filled 2014-07-31: qty 3

## 2014-07-31 MED ORDER — IBUPROFEN 200 MG PO TABS
600.0000 mg | ORAL_TABLET | Freq: Once | ORAL | Status: AC
  Administered 2014-07-31: 600 mg via ORAL

## 2014-07-31 MED ORDER — IBUPROFEN 400 MG PO TABS: 400.0000 mg | ORAL_TABLET | Freq: Four times a day (QID) | ORAL | Status: DC | PRN

## 2014-07-31 NOTE — ED Notes (Signed)
MD at bedside. 

## 2014-07-31 NOTE — Discharge Instructions (Signed)
We saw you in the ER for the chest pain/shortness of breath. All of our cardiac workup is normal, including labs, EKG and chest X-RAY are normal. We are not sure what is causing your discomfort, but we feel comfortable sending you home at this time. The workup in the ER is not complete, and you should follow up with your primary care doctor for further evaluation.  READ THE INSTRUCTIONS ON PULMONARY EMBOLISM BELOW - IF ANY SYMPTOMS COME, RETURN TO THE ER.  SEE A PRIMARY DOCTOR SOON, FOR REPEAT HEART EVALUATION.  RESOURCE GUIDE  Chronic Pain Problems: Contact [facility]  [phone] Patients need to be referred by their primary care doctor.  Insufficient Money for Medicine: Contact United Way:  call "211."   No Primary Care Doctor: - Call Health Connect  [phone] - can help you locate a primary care doctor that  accepts your insurance, provides certain services, etc. - Physician Referral Service- [phone]  Agencies that provide inexpensive medical care: - [facility]  [phone] - [facility]  [phone] - [facility]  [phone] - Women[facility] Clinic  [phone] - [facility]  [phone] Haynes Bast- [facility]  [phone]  Medicaid-accepting [facility] Providers: - [facility]- [address], [address]  [phone], Mon-Fri 9am-7pm, Sat 9am-1pm - [facility]- [address], [address] [place]  [phone] - [facility]- [address], [address] [place]  [phone] [facility]- [address] [place] [address]  [phone] - Renaye RakersVeita Bland- [address], [address], [phone]  Only accepts [place] patients after they have their name  applied to their card  Self Pay (no insurance) in [place]: - Sickle Cell Patients: Dr Willey BladeEric Dean, [facility]  [address], [phone] - [facility]- [address]  [phone]       Redge Gainer-     [facility]- [address], [address]       -     [facility]- see information above (Speak to CitigroupPam H if you do not have insurance)       -  [facility] [place]- [address],  [phone]       -  [facility]- [address] [place] [address], [phone]       -  Dr Julio Sickssei-Bonsu-  [address], [address], [place], [phone]       -  [facility] - [address], [phone]       -  [facility] [place]- [address], [phone], also [address], [phone]       -    [facility]- [address], [phone], 1st & 3rd Saturday        every month, 10am-1pm  [facility] [address] [place], [place] [address] [phone]  [facility] [address] [place], [place] [address] [phone]  1) Find a Doctor and Pay Out of Pocket Although you won't have to find out who is covered by your insurance plan, it is a good idea to ask around and get recommendations. You will then need to call the office and see if the doctor you have chosen will accept you as a new patient and what types of options they offer for  patients who are self-pay. Some doctors offer discounts or will set up payment plans for their patients who do not have insurance, but you will need to ask so you aren't surprised when you get to your appointment.  2) Contact Your Local Health Department Not all health departments have doctors that can see patients for sick visits, but many do, so it is worth a call to see if yours does. If you don't know where your local health department is, you can check in your phone book. The CDC also has a tool to help you locate your state's health department, and many state websites also have listings of all of their local health departments.  3) Find a Walk-in Clinic If your illness is not likely to be very severe or complicated, you may want  to try a walk in clinic. These are popping up all over the country in pharmacies, drugstores, and shopping centers. They're usually staffed by nurse practitioners or physician assistants that have been trained to treat common illnesses and complaints. They're usually fairly quick and inexpensive. However, if you have serious medical issues or chronic medical problems, these are probably not your best option  STD Testing - [facility] Department of [facility] [place], STD Clinic, [address] [place], phone [phone] or [phone].  Monday - Friday, call for an appointment. [facility] of [employer], STD Clinic, [address], [place], phone [phone] or [phone].  Monday - Friday, call for an appointment.  Abuse/Neglect: [facility] Child Abuse Hotline (224) 086-2309 [facility] Child Abuse Hotline (406)166-7215 (After Hours)  Emergency Shelter:  [facility] 8564866571  Maternity Homes: - [facility] at [facility] [phone] - [facility] [phone]  MRSA Hotline #:   857-290-0293  Dental Assistance If unable to pay or uninsured, contact:  [facility]. to become qualified for the adult dental clinic.  Patients with Medicaid: [facility] [phone], [phone] [address] [phone]  If unable to pay, or uninsured, contact [facility] [phone] in [place], [phone] in [facility]) to become qualified for the adult dental clinic  [facility] [address] [place], [place] [address] [phone] [email or web address]  Other Proofreader Services: - [facility]- [address] [place], [place], [place], [address], [phone], Ext. [phone], 2nd and 4th Thursday of the month at 6:30am.  10 clients each day by appointment, can sometimes see walk-in patients if someone does not show for  an appointment. [facility]- [address] [place], [place], [address], [phone] - [facility]- [address] [place], [place], [address], [phone] [facility] Health Department- [phone] [facility] Health Department- [phone] [facility] [phone]           Chest Wall Pain Chest wall pain is pain in or around the bones and muscles of your chest. It may take up to 6 weeks to get better. It may take longer if you must stay physically active in your work and activities.  CAUSES  Chest wall pain may happen on its own. However, it may be caused by:  A viral illness like the flu.  Injury.  Coughing.  Exercise.  Arthritis.  Fibromyalgia.  Shingles. HOME CARE INSTRUCTIONS   Avoid overtiring physical activity. Try not to strain or perform activities that cause pain. This includes any  activities using your chest or your abdominal and side muscles, especially if heavy weights are used.  Put ice on the sore area.  Put ice in a plastic bag.  Place a towel between your skin and the bag.  Leave the ice on for 15-20 minutes per hour while awake for the first 2 days.  Only take over-the-counter or prescription medicines for pain, discomfort, or fever as directed by your caregiver. SEEK IMMEDIATE MEDICAL CARE IF:   Your pain increases, or you are very uncomfortable.  You have a fever.  Your chest pain becomes worse.  You have new, unexplained symptoms.  You have nausea or vomiting.  You feel sweaty or lightheaded.  You have a cough with phlegm (sputum), or you cough up blood. MAKE SURE YOU:   Understand these instructions.  Will watch your condition.  Will get help right away if you are not doing well or get worse. Document Released: 11/29/2005 Document Revised: 02/21/2012 Document Reviewed: 07/26/2011 [facility] Patient Information 2015 [place], [place]. This information is not intended to  replace advice given to you by your health care provider. Make sure you discuss any questions you have with your health care provider. Pulmonary Embolism A pulmonary (lung) embolism (PE) is a blood clot that has traveled to the lung and results in a blockage of blood flow in the affected lung. Most clots come from deep veins in the legs or pelvis. PE is a dangerous and potentially life-threatening condition that can be treated if identified. CAUSES Blood clots form in a vein for different reasons. Usually several things cause blood clots. They include:  The flow of blood slows down.  The inside of the vein is damaged in some way.  The person has a condition that makes the blood clot more easily. RISK FACTORS Some people are more likely than others to develop PE. Risk factors include:   Smoking.  Being overweight (obese).  Sitting or lying still for a long time. This includes long-distance travel, paralysis, or recovery from an illness or surgery. Other factors that increase risk are:   Older age, especially over 83 years of age.  Having a family history of blood clots or if you have already had a blood clot.  Having major or lengthy surgery. This is especially true for surgery on the hip, knee, or belly (abdomen). Hip surgery is particularly high risk.  Having a long, thin tube (catheter) placed inside a vein during a medical procedure.  Breaking a hip or leg.  Having cancer or cancer treatment.  Medicines containing the female hormone estrogen. This includes birth control pills and hormone replacement therapy.  Other circulation or heart problems.  Pregnancy and childbirth.  Hormone changes make the blood clot more easily during pregnancy.  The fetus puts pressure on the veins of the pelvis.  There is a risk of injury to veins during delivery or a caesarean delivery. The risk is highest just after childbirth.  PREVENTION   Exercise the legs regularly. Take a brisk 30  minute walk every day.  Maintain a weight that is appropriate for your height.  Avoid sitting or lying in bed for long periods of time without moving your legs.  Women, particularly those over the age of 35 years, should consider the risks and benefits of taking estrogen medicines, including birth control pills.  Do not smoke, especially if you take estrogen medicines.  Long-distance travel can increase your risk. You should exercise your legs by walking or pumping the muscles  every hour.  Many of the risk factors above relate to situations that exist with hospitalization, either for illness, injury, or elective surgery. Prevention may include medical and nonmedical measures.   Your health care provider will assess you for the need for venous thromboembolism prevention when you are admitted to the hospital. If you are having surgery, your surgeon will assess you the day of or day after surgery.  SYMPTOMS  The symptoms of a PE usually start suddenly and include:  Shortness of breath.  Coughing.  Coughing up blood or blood-tinged mucus.  Chest pain. Pain is often worse with deep breaths.  Rapid heartbeat. DIAGNOSIS  If a PE is suspected, your health care provider will take a medical history and perform a physical exam. Other tests that may be required include:  Blood tests, such as studies of the clotting properties of your blood.  Imaging tests, such as ultrasound, CT, MRI, and other tests to see if you have clots in your legs or lungs.  An electrocardiogram. This can look for heart strain from blood clots in the lungs. TREATMENT   The most common treatment for a PE is blood thinning (anticoagulant) medicine, which reduces the blood's tendency to clot. Anticoagulants can stop new blood clots from forming and old clots from growing. They cannot dissolve existing clots. Your body does this by itself over time. Anticoagulants can be given by mouth, through an intravenous (IV)  tube, or by injection. Your health care provider will determine the best program for you.  Less commonly, clot-dissolving medicines (thrombolytics) are used to dissolve a PE. They carry a high risk of bleeding, so they are used mainly in severe cases.  Very rarely, a blood clot in the leg needs to be removed surgically.  If you are unable to take anticoagulants, your health care provider may arrange for you to have a filter placed in a main vein in your abdomen. This filter prevents clots from traveling to your lungs. HOME CARE INSTRUCTIONS   Take all medicines as directed by your health care provider.  Learn as much as you can about DVT.  Wear a medical alert bracelet or carry a medical alert card.  Ask your health care provider how soon you can go back to normal activities. It is important to stay active to prevent blood clots. If you are on anticoagulant medicine, avoid contact sports.  It is very important to exercise. This is especially important while traveling, sitting, or standing for long periods of time. Exercise your legs by walking or by tightening and relaxing your leg muscles regularly. Take frequent walks.  You may need to wear compression stockings. These are tight elastic stockings that apply pressure to the lower legs. This pressure can help keep the blood in the legs from clotting. Taking Warfarin Warfarin is a daily medicine that is taken by mouth. Your health care provider will advise you on the length of treatment (usually 3-6 months, sometimes lifelong). If you take warfarin:  Understand how to take warfarin and foods that can affect how warfarin works in Public relations account executive.  Too much and too little warfarin are both dangerous. Too much warfarin increases the risk of bleeding. Too little warfarin continues to allow the risk for blood clots. Warfarin and Regular Blood Testing While taking warfarin, you will need to have regular blood tests to measure your blood clotting time.  These blood tests usually include both the prothrombin time (PT) and international normalized ratio (INR) tests. The PT and  INR results allow your health care provider to adjust your dose of warfarin. It is very important that you have your PT and INR tested as often as directed by your health care provider.  Warfarin and Your Diet Avoid major changes in your diet, or notify your health care provider before changing your diet. Arrange a visit with a registered dietitian to answer your questions. Many foods, especially foods high in vitamin K, can interfere with warfarin and affect the PT and INR results. You should eat a consistent amount of foods high in vitamin K. Foods high in vitamin K include:   Spinach, kale, broccoli, cabbage, collard and turnip greens, Brussels sprouts, peas, cauliflower, seaweed, and parsley.  Beef and pork liver.  Green tea.  Soybean oil. Warfarin with Other Medicines Many medicines can interfere with warfarin and affect the PT and INR results. You must:  Tell your health care provider about any and all medicines, vitamins, and supplements you take, including aspirin and other over-the-counter anti-inflammatory medicines. Be especially cautious with aspirin and anti-inflammatory medicines. Ask your health care provider before taking these.  Do not take or discontinue any prescribed or over-the-counter medicine except on the advice of your health care provider or pharmacist. Warfarin Side Effects Warfarin can have side effects, such as easy bruising and difficulty stopping bleeding. Ask your health care provider or pharmacist about other side effects of warfarin. You will need to:  Hold pressure over cuts for longer than usual.  Notify your dentist and other health care providers that you are taking warfarin before you undergo any procedures where bleeding may occur. Warfarin with Alcohol and Tobacco   Drinking alcohol frequently can increase the effect of warfarin,  leading to excess bleeding. It is best to avoid alcoholic drinks or consume only very small amounts while taking warfarin. Notify your health care provider if you change your alcohol intake.  Do not use any tobacco products including cigarettes, chewing tobacco, or electronic cigarettes. If you smoke, quit. Ask your health care provider for help with quitting smoking. Alternative Medicines to Warfarin: Factor Xa Inhibitor Medicines  These blood thinning medicines are taken by mouth, usually for several weeks or longer. It is important to take the medicine every single day, at the same time each day.  There are no regular blood tests required when using these medicines.  There are fewer food and drug interactions than with warfarin.  The side effects of this class of medicine is similar to that of warfarin, including excessive bruising or bleeding. Ask your health care provider or pharmacist about other potential side effects. SEEK MEDICAL CARE IF:   You notice a rapid heartbeat.  You feel weaker or more tired than usual.  You feel faint.  You notice increased bruising.  Your symptoms are not getting better in the time expected.  You are having side effects of medicine. SEEK IMMEDIATE MEDICAL CARE IF:   You have chest pain.  You have trouble breathing.  You have new or increased swelling or pain in one leg.  You cough up blood.  You notice blood in vomit, in a bowel movement, or in urine.  You have a fever. Symptoms of PE may represent a serious problem that is an emergency. Do not wait to see if the symptoms will go away. Get medical help right away. Call your local emergency services (911 in the [place]). Do not drive yourself to the hospital. Document Released: 11/26/2000 Document Revised: 04/15/2014 Document Reviewed: 12/10/2013 ExitCare  Patient Information 2015 [place], [place]. This information is not intended to replace advice given to you by your health care  provider. Make sure you discuss any questions you have with your health care provider.

## 2014-07-31 NOTE — ED Notes (Signed)
Reports right upper and mid upper chest pain that was present yesterday evening, was improved during last 20 hrs, then worsened again.  Pain increases with cough, deep inspiration, getting up from supine position.  Denies injury.

## 2014-08-03 NOTE — ED Provider Notes (Signed)
CSN: [id number]     Arrival date & time 07/30/14  2152 History   First MD Initiated Contact with Patient 07/31/14 0402     Chief Complaint  Patient presents with  . Chest Pain    The patient said she started having chest pain last night at about 0200.  She said she had her kids and did not have anyone to take care of them.      (Consider location/radiation/quality/duration/timing/severity/associated sxs/prior Treatment) Patient is a 33 y.o. female presenting with chest pain. The history is provided by the patient.  Chest Pain Pain location:  R chest Pain quality: sharp   Pain radiates to:  Does not radiate Pain radiates to the back: no   Pain severity:  Moderate Onset quality:  Sudden Duration:  1 day Timing:  Constant Progression:  Unchanged Chronicity:  New Relieved by:  Nothing Worsened by:  Deep breathing Associated symptoms: no cough, no diaphoresis, no fever, no nausea, no near-syncope and no shortness of breath     Past Medical History  Diagnosis Date  . Sickle cell trait   . Pregnant   . Hypertension     Hx PIH with 2002 preg, no current problems  . Depression     PTSD (1997) major depression (2013) - no meds  . Migraine     otc med prn   Past Surgical History  Procedure Laterality Date  . Cesarean section      x 6  . Wisdom tooth extraction    . Cesarean section with bilateral tubal ligation Bilateral 03/02/2014    Procedure: REPEAT CESAREAN SECTION WITH BILATERAL TUBAL LIGATION;  Surgeon: Allie Bossier, MD;  Location: [facility];  Service: Obstetrics;  Laterality: Bilateral;   Family History  Problem Relation Age of Onset  . Hypertension Mother   . Hypertension Father   . Diabetes Father    History  Substance Use Topics  . Smoking status: Never Smoker   . Smokeless tobacco: Never Used  . Alcohol Use: No     Comment: ocassionaly before pregnancy   OB History   Grav Para Term Preterm Abortions TAB SAB Ect Mult Living   7 6 6  1  1   6      Review of  Systems  Constitutional: Negative for fever and diaphoresis.  Respiratory: Negative for cough and shortness of breath.   Cardiovascular: Positive for chest pain. Negative for near-syncope.  Gastrointestinal: Negative for nausea.      Allergies  Fish allergy and Penicillins  Home Medications   Prior to Admission medications   Medication Sig Start Date End Date Taking? Authorizing Provider  ibuprofen (ADVIL,MOTRIN) 400 MG tablet Take 1 tablet (400 mg total) by mouth every 6 (six) hours as needed. 07/31/14   Cortland Crehan Rhunette Croft, MD   BP 123/81  Pulse 89  Temp(Src) 98.5 F (36.9 C) (Oral)  Resp 18  Ht 5\' 4"  (1.626 m)  SpO2 98%  LMP 07/10/2014 Physical Exam  Nursing note and vitals reviewed. Constitutional: She is oriented to person, place, and time. She appears well-developed and well-nourished.  HENT:  Head: Normocephalic and atraumatic.  Eyes: EOM are normal. Pupils are equal, round, and reactive to light.  Neck: Neck supple.  Cardiovascular: Normal rate, regular rhythm, normal heart sounds and intact distal pulses.   No murmur heard. Pulmonary/Chest: Effort normal. No respiratory distress. She has no wheezes.  Abdominal: Soft. She exhibits no distension. There is no tenderness. There is no rebound and no guarding.  Musculoskeletal: She exhibits no edema and no tenderness.  Neurological: She is alert and oriented to person, place, and time.  Skin: Skin is warm and dry.    ED Course  Procedures (including critical care time) Labs Review Labs Reviewed  BASIC METABOLIC PANEL - Abnormal; Notable for the following:    GFR calc non Af Amer 71 (*)    GFR calc Af Amer 83 (*)    All other components within normal limits  CBC  PRO B NATRIURETIC PEPTIDE  I-STAT TROPOININ, ED  POC URINE PREG, ED    Imaging Review No results found.   EKG Interpretation   Date/Time:  Tuesday July 30 2014 22:12:29 EDT Ventricular Rate:  83 PR Interval:  108 QRS Duration: 90 QT Interval:   370 QTC Calculation: 434 R Axis:   30 Text Interpretation:  Sinus rhythm with short PR with occasional Premature  ventricular complexes Nonspecific T wave abnormality Abnormal ECG ED  PHYSICIAN INTERPRETATION AVAILABLE IN [facility] Confirmed by TEST,  Record ([id number]) on 08/01/2014 7:05:41 AM      MDM   Final diagnoses:  Costochondral chest pain    Pt with new chest pain. HEART Score is 0. Right sided, pleuritic chest pain. WELLS score is 0, and she is PERC neg.  Plan was to get CXR, ae and despite PERC negative, get a dimer. PT had waited a long time prior to me seeing her, and is present with all her kids is the middle of the night. She prefers going home. Return precautions for PE discussed.  Filed Vitals:   07/31/14 0441  BP: 123/81  Pulse: 89  Temp:   Resp: 18     Derwood KaplanAnkit Damaris Geers, MD 08/03/14 [phone]

## 2014-10-14 ENCOUNTER — Encounter (HOSPITAL_COMMUNITY): Payer: Self-pay

## 2014-10-14 ENCOUNTER — Emergency Department (HOSPITAL_COMMUNITY)
Admission: EM | Admit: 2014-10-14 | Discharge: 2014-10-14 | Disposition: A | Discharge status: Home / Self Care | Payer: Medicaid Other | Attending: Emergency Medicine | Admitting: Emergency Medicine

## 2014-10-14 ENCOUNTER — Emergency Department (HOSPITAL_COMMUNITY): Payer: Medicaid Other

## 2014-10-14 DIAGNOSIS — Z880 Allergy status to penicillin: Secondary | ICD-10-CM | POA: Insufficient documentation

## 2014-10-14 DIAGNOSIS — R51 Headache: Secondary | ICD-10-CM | POA: Insufficient documentation

## 2014-10-14 DIAGNOSIS — R4182 Altered mental status, unspecified: Secondary | ICD-10-CM

## 2014-10-14 DIAGNOSIS — Z8659 Personal history of other mental and behavioral disorders: Secondary | ICD-10-CM | POA: Insufficient documentation

## 2014-10-14 DIAGNOSIS — R410 Disorientation, unspecified: Secondary | ICD-10-CM

## 2014-10-14 DIAGNOSIS — R519 Headache, unspecified: Secondary | ICD-10-CM

## 2014-10-14 DIAGNOSIS — Z862 Personal history of diseases of the blood and blood-forming organs and certain disorders involving the immune mechanism: Secondary | ICD-10-CM | POA: Insufficient documentation

## 2014-10-14 DIAGNOSIS — I10 Essential (primary) hypertension: Secondary | ICD-10-CM | POA: Insufficient documentation

## 2014-10-14 LAB — CBG MONITORING, ED: GLUCOSE-CAPILLARY: 76 mg/dL (ref 70–99)

## 2014-10-14 NOTE — ED Notes (Signed)
Pt walked out of room with family. RN asked patient to wait for paperwork. Pt refusing discharge paperwork. Mother following patient to door. Father at bedside for discharge paperwork.

## 2014-10-14 NOTE — ED Notes (Signed)
1810: RN to bedside to draw labs and explain treatment plan with patient. Pt refusing IV, labs and scans. Pt states "I just want to go home. I don't know where that is, but I'm sure I can find it." RN attempted to persuade pt to cooperate with staff. Pt continues to refuse care.  1815: RN notified MD. MD order for restraints if pt unable to cooperate with staff. 1816: RN again attempted to persuade pt to cooperate with staff. RN explained alternatives to pt if unwilling to cooperate. Pt became agitated, demanding to leave. Pt states "I'm not confused! I don't need no blood drawn." Pt able to state date/time. Still having difficulty with family status. 1820: MD notified of new onset agitation. MD to bedside. MD discussed pt needs with pt. During MD discussion with patient, patient began to remember. Pt able to state husband's name, children's name and ages after much difficulty. Able to state home address. Pt agreeable to stay until family returns. 1905: MD at bedside with family to reassess. Pt able to name all family members. Family agreeable to take pt home under their care. Pt alert/oriented x4.

## 2014-10-14 NOTE — ED Provider Notes (Signed)
CSN: [id number]     Arrival date & time 10/14/14  1724 History   First MD Initiated Contact with Patient 10/14/14 1802     Chief Complaint  Patient presents with  . Altered Mental Status     (Consider location/radiation/quality/duration/timing/severity/associated sxs/prior Treatment) HPI 33 year old female last known well 6:30 this morning EMS was called by children in the house and found patient confused, patient states she woke up from a nap and did not know who she was aware she was and did not recognize the children in the house and has no idea who his children they are and did not recognize her husband when he came home from work and does not want him with her, she states she has a frontal headache but does not know how long it has been there, she denies change in speech or vision or swallowing or understanding she denies chest pain cough shortness breath abdominal pain vomiting diarrhea vaginal bleeding vaginal discharge rash lateralizing weakness or numbness or vertigo, she denies being confused no she admits she does not know who she is aware she has or what day it is. She denies being married or having any children. She states she lives alone in a house but when she woke up from her nap was not hers as far she is aware. Patient does not want to be in the emergency department but does not realize she is an emergency department she wants to leave and does not want any testing performed in the emergency department other than a CT scan of her head but patient does not appear to have capacity to make medical decisions upon presentation due to her confusion. ED nurse's report that the patient's husband told EMS he has never seen her confused like this previously. The patient is aware she has a history of sickle cell trait but denies prior pregnancy or other medical conditions even though her medical record includes prior pregnancy hypertension depression and migraines. Past Medical History  Diagnosis  Date  . Sickle cell trait   . Pregnant   . Hypertension     Hx PIH with 2002 preg, no current problems  . Depression     PTSD (1997) major depression (2013) - no meds  . Migraine     otc med prn   Past Surgical History  Procedure Laterality Date  . Cesarean section      x 6  . Wisdom tooth extraction    . Cesarean section with bilateral tubal ligation Bilateral 03/02/2014    Procedure: REPEAT CESAREAN SECTION WITH BILATERAL TUBAL LIGATION;  Surgeon: Allie BossierMyra C Dove, MD;  Location: [facility];  Service: Obstetrics;  Laterality: Bilateral;   Family History  Problem Relation Age of Onset  . Hypertension Mother   . Hypertension Father   . Diabetes Father    History  Substance Use Topics  . Smoking status: Never Smoker   . Smokeless tobacco: Never Used  . Alcohol Use: No     Comment: ocassionaly before pregnancy   OB History    Gravida Para Term Preterm AB TAB SAB Ectopic Multiple Living   7 6 6  1  1   6      Review of Systems 10 Systems reviewed and are negative for acute change except as noted in the HPI.   Allergies  Fish allergy and Penicillins  Home Medications   Prior to Admission medications   Medication Sig Start Date End Date Taking? Authorizing Provider  ibuprofen (ADVIL,MOTRIN) 400 MG  tablet Take 1 tablet (400 mg total) by mouth every 6 (six) hours as needed. 07/31/14   Ankit Nanavati, MD   BP 123/74 mmHg  Pulse 111  Resp 18  SpO2 100% Physical Exam  Constitutional:  Awake, alert, nontoxic appearance with baseline speech for patient.  HENT:  Head: Atraumatic.  Mouth/Throat: No oropharyngeal exudate.  Eyes: EOM are normal. Pupils are equal, round, and reactive to light. Right eye exhibits no discharge. Left eye exhibits no discharge.  Neck: Neck supple.  Cardiovascular: Normal rate and regular rhythm.   No murmur heard. Pulmonary/Chest: Effort normal and breath sounds normal. No stridor. No respiratory distress. She has no wheezes. She has no rales. She  exhibits no tenderness.  Abdominal: Soft. Bowel sounds are normal. She exhibits no mass. There is no tenderness. There is no rebound.  Musculoskeletal: She exhibits no tenderness.  Baseline ROM, moves extremities with no obvious new focal weakness.  Lymphadenopathy:    She has no cervical adenopathy.  Neurological: She is alert.  Awake, alert, but not oriented to person place or time and somewhat uncooperative and unaware of situation; motor strength 5/5 bilaterally; sensation normal to light touch bilaterally; peripheral visual fields full to confrontation; no facial asymmetry; tongue midline; major cranial nerves appear intact; no pronator drift, normal finger to nose bilaterally  Skin: No rash noted.  Psychiatric: She has a normal mood and affect.  Nursing note and vitals reviewed.   ED Course  Procedures (including critical care time) Patient arrives in acute delirium and refuses treatment in the emergency department but does not have the mental capacity to do so and therefore medical restraints ordered in order to most safely evaluate patient due to her refusal to cooperate and desire to leave AGAINST MEDICAL ADVICE which she cannot due to to her acute delirium. 1825  Patient allowed family back in the room; patient had her confusion cleared after orders placed; she is oriented to person place and time and she attributes the transient confusion as due to her chronic headaches which she has every day for years with gradual onset over the last 4 days of typical headache for her; family is comfortable taking the patient home; patient has normal speech and gait. There is no fever no trauma and patient denies domestic violence patient now states she is married and knows her husband's name noted children's names and ages has normal speech and gait denies suicidal or homicidal ideation denies hallucinations and her parents escorted her 2 of her children to the emergency department and patient's  parents believe she is back to baseline and does not need imaging or other testing in the emergency department. 1915 Labs Review Labs Reviewed  CULTURE, BLOOD (ROUTINE X 2)  CULTURE, BLOOD (ROUTINE X 2)  CBG MONITORING, ED  CBG MONITORING, ED    Imaging Review No results found.   EKG Interpretation None      MDM   Final diagnoses:  Confusion  Altered mental status  Nonintractable headache, unspecified chronicity pattern, unspecified headache type    Pt feels improved after observation and/or treatment in ED.Patient / Family / Caregiver informed of clinical course, understand medical decision-making process, and agree with plan. I doubt any other EMC precluding discharge at this time including, but not necessarily limited to the following:SAH, CVA, SBI.    Hurman HornJohn M Shamel Galyean, MD 10/15/14 [phone]

## 2014-10-14 NOTE — Discharge Instructions (Signed)
You are having a headache. No specific cause was found today for your headache. It may have been a migraine or other cause of headache. Stress, anxiety, fatigue, and depression are common triggers for headaches. Your headache today does not appear to be life-threatening or require hospitalization, but often the exact cause of headaches is not determined in the emergency department. Therefore, follow-up with your doctor is very important to find out what may have caused your headache, and whether or not you need any further diagnostic testing or treatment. Sometimes headaches can appear benign (not harmful), but then more serious symptoms can develop which should prompt an immediate re-evaluation by your doctor or the emergency department. SEEK MEDICAL ATTENTION IF: You develop possible problems with medications prescribed.  The medications don't resolve your headache, if it recurs , or if you have multiple episodes of vomiting or can't take fluids. You have a change from the usual headache. RETURN IMMEDIATELY IF you develop a sudden, severe headache or confusion, become poorly responsive or faint, develop a fever above 100.38F or problem breathing, have a change in speech, vision, swallowing, or understanding, or develop new weakness, numbness, tingling, incoordination, or have a seizure.  Not every illness or injury can be identified during an emergency department visit, thus follow-up with your primary healthcare provider is important. Medical conditions can also worsen, so it is also important to return immediately as directed below, or if you have other serious concerns develop. RETURN IMMEDIATELY IF you develop new shortness of breath, chest pain, fever, have difficulty moving parts of your body (new weakness, numbness, or incoordination), sudden change in speech, vision, swallowing, or understanding, faint or develop new dizziness, severe headache, become poorly responsive or have an altered mental  status compared to baseline for you, new rash, abdominal pain, or bloody stools,  Return sooner also if you develop new problems for which you have not talked to your caregiver but you feel may be emergency medical conditions, or are unable to be cared for safely at home.

## 2014-10-14 NOTE — ED Notes (Signed)
Per EMS: Pt took a nap, upon waking, was confused. Disoriented x4. Pt complaining of extreme headache.

## 2014-10-24 ENCOUNTER — Encounter (HOSPITAL_COMMUNITY): Payer: Self-pay

## 2015-05-10 IMAGING — US US FETAL BPP W/O NONSTRESS
1 series · 12 of 28 positions shown · non-contrast
Comparison: none

[Series 1: us fetal bpp w/o nonstress · 0.23mm/px · 12 of 35 slices shown]
[im 2/35]
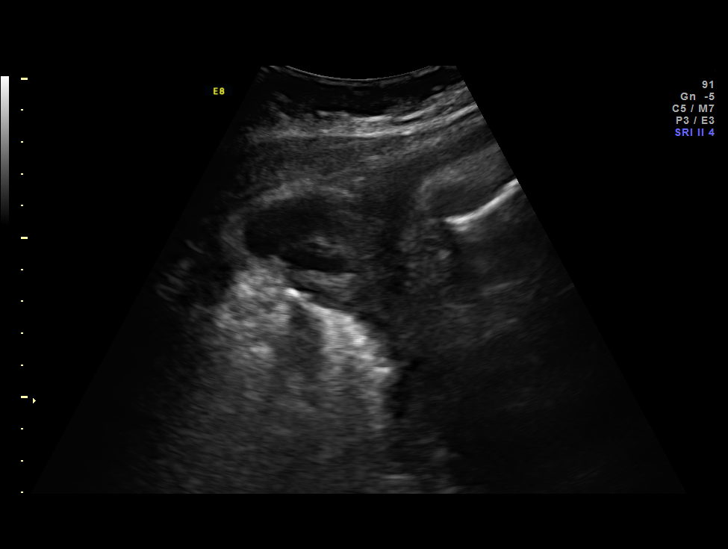
[im 4/35]
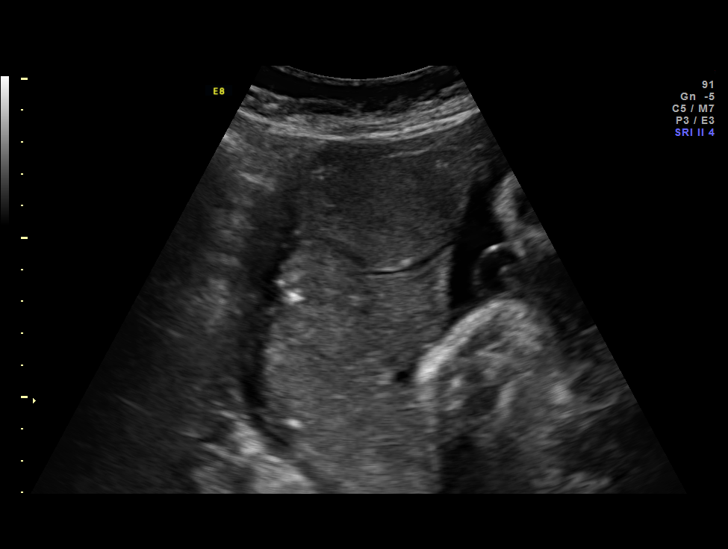
[im 7/35]
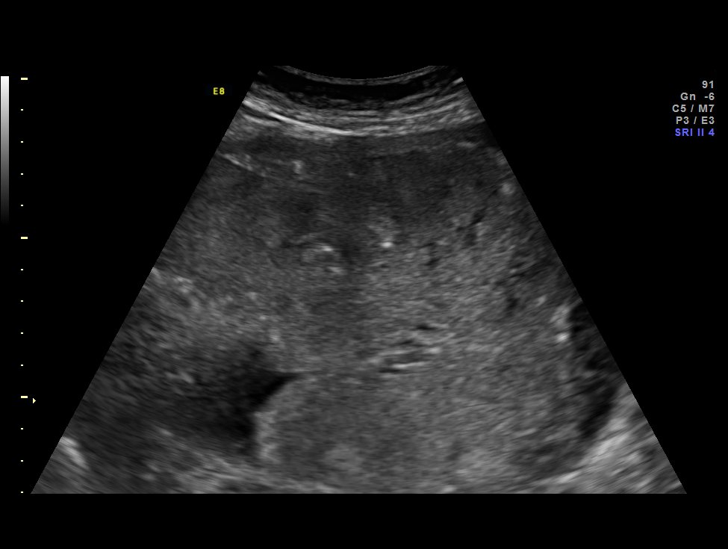
[im 11/35]
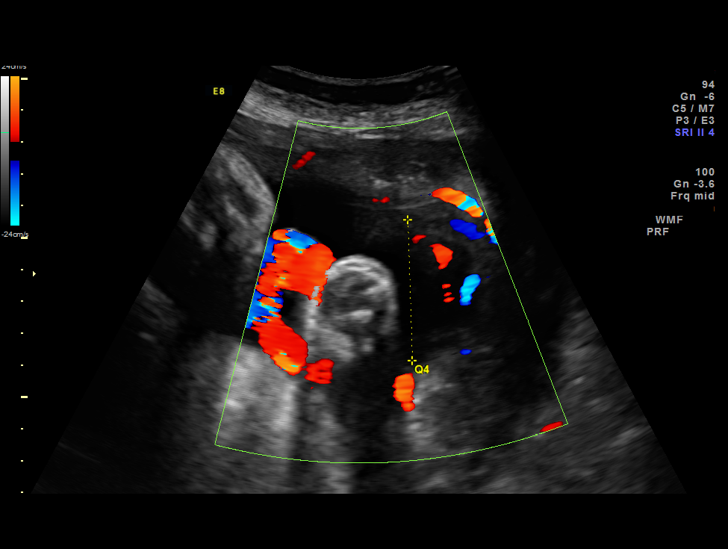
[im 13/35]
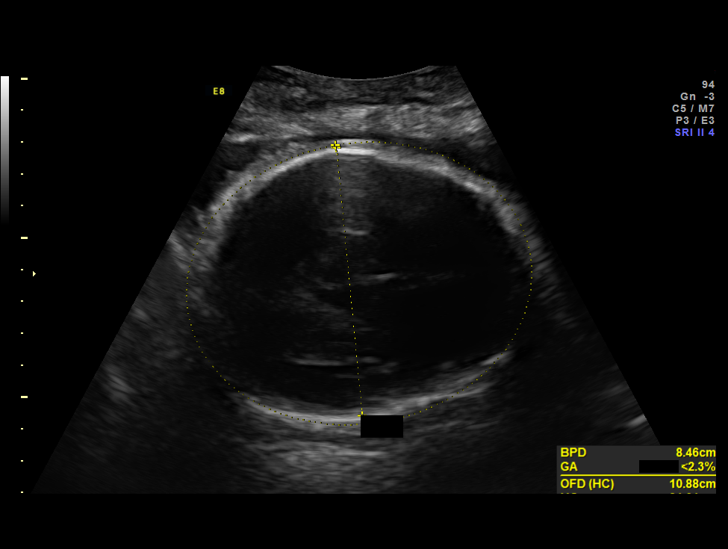
[im 16/35]
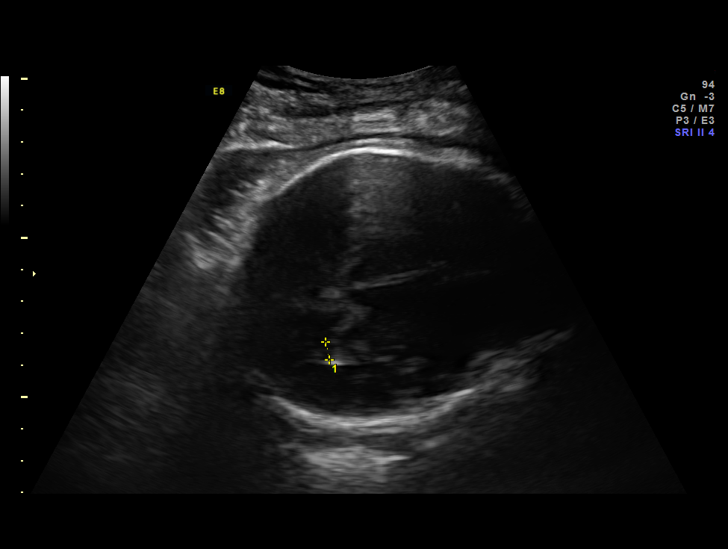
[im 19/35]
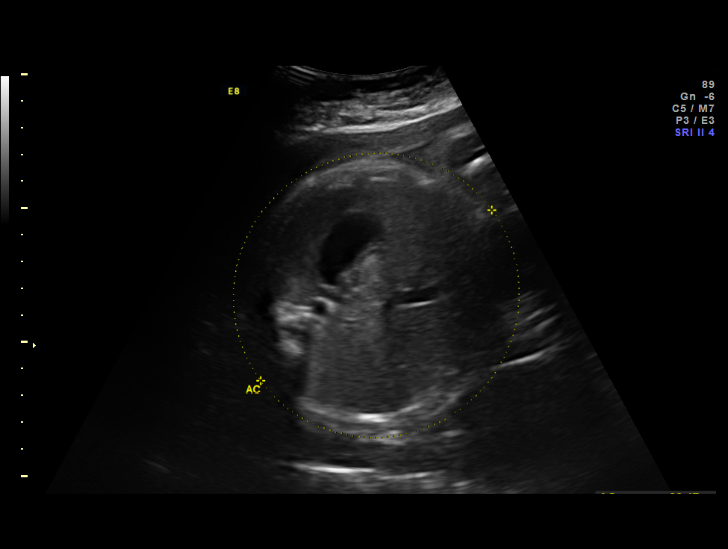
[im 22/35]
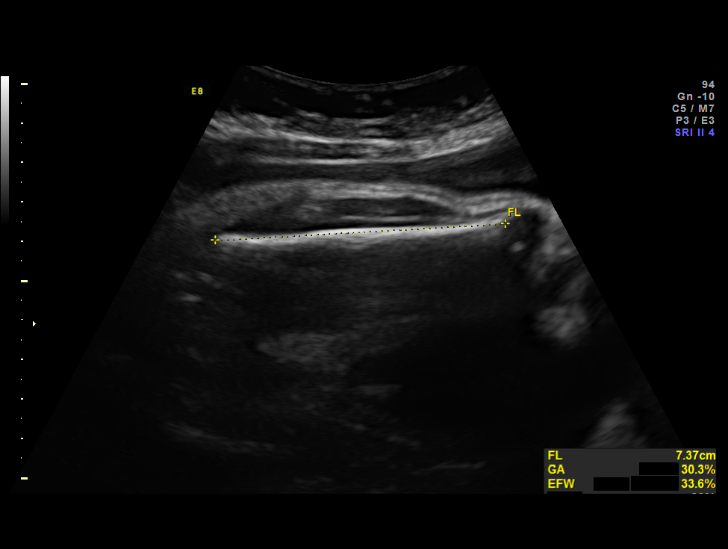
[im 24/35]
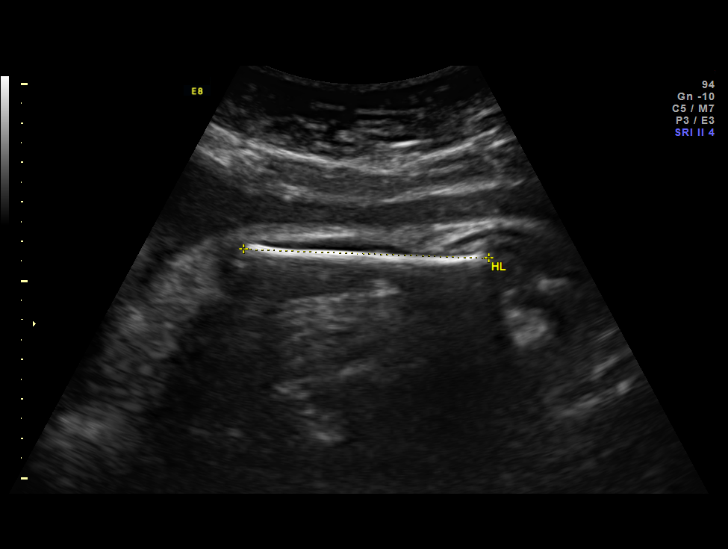
[im 28/35]
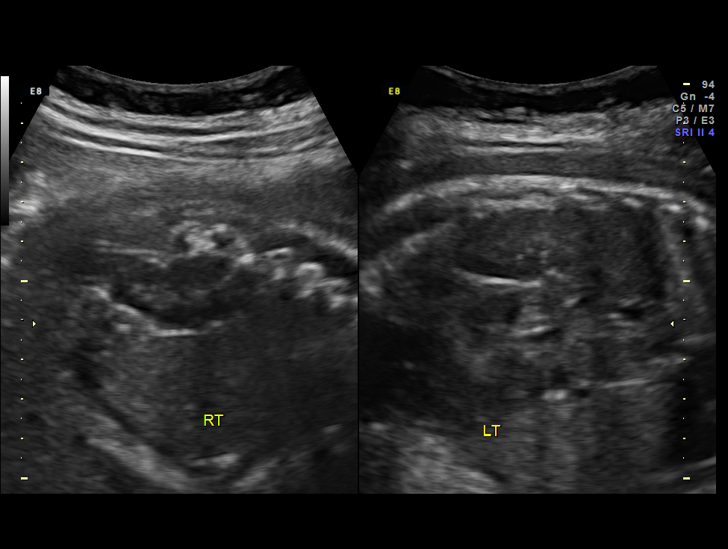
[im 31/35]
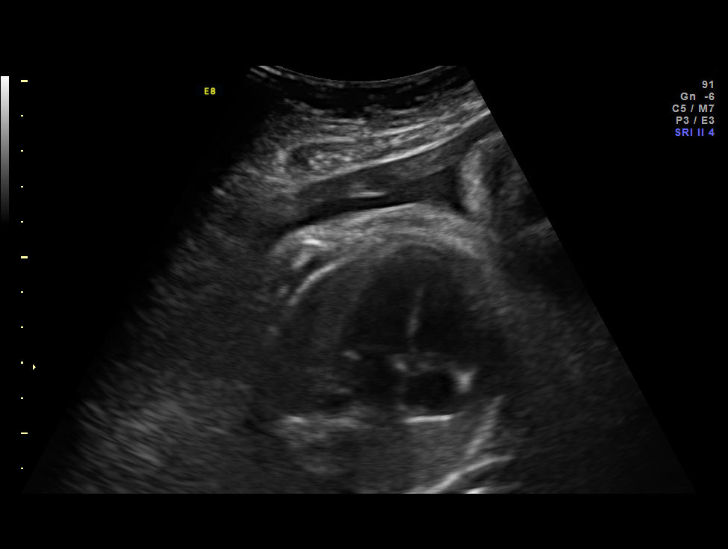
[im 33/35]
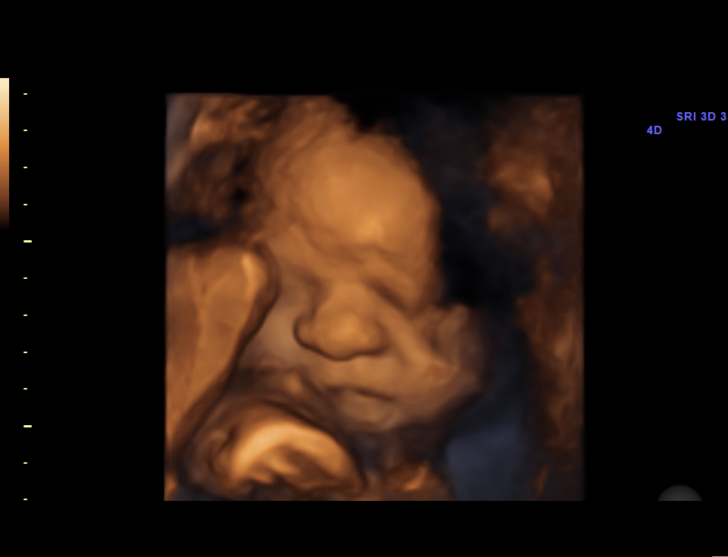

[12 of 28 positions shown; findings below may reference images not displayed]

OBSTETRICS REPORT
                      (Signed Final 02/28/2014 [DATE])

 Name:       HANS ROJA                 Visit Date: 02/28/2014 [DATE]

Service(s) Provided

 US OB FOLLOW UP                                       76816.1
Indications

 Previous cesarean section x 5
 Hypertension - Chronic/Pre-existing
Fetal Evaluation

 Num Of Fetuses:    1
 Fetal Heart Rate:  148                          bpm
 Cardiac Activity:  Observed
 Presentation:      Cephalic
 Placenta:          Fundal, above cervical os
 P. Cord            Previously Visualized
 Insertion:

 Comment:    BPP [DATE] in 30 minutes.

 Amniotic Fluid
 AFI FV:      Subjectively within normal limits
 AFI Sum:     11.57   cm       37  %Tile     Larg Pckt:    4.42  cm
 RUQ:   3.74    cm   RLQ:    4.42   cm    LUQ:   1.05    cm   LLQ:    2.36   cm
Biophysical Evaluation

 Amniotic F.V:   Pocket => 2 cm two         F. Tone:        Observed
                 planes
 F. Movement:    Observed                   Score:          [DATE]
 F. Breathing:   Observed
Biometry

 BPD:     84.2  mm     G. Age:  33w 6d                CI:         77.5   70 - 86
 OFD:    108.7  mm                                    FL/HC:      23.6   20.9 -

 HC:     310.1  mm     G. Age:  34w 4d      < 3  %    HC/AC:      0.94   0.92 -

 AC:     330.9  mm     G. Age:  37w 0d       46  %    FL/BPD:     86.8   71 - 87
 FL:      73.1  mm     G. Age:  37w 3d       44  %    FL/AC:      22.1   20 - 24
 HUM:     62.6  mm     G. Age:  36w 2d       49  %

 Est. FW:    8669  gm      6 lb 8 oz     47  %
Gestational Age

 LMP:           37w 5d        Date:  06/09/13                 EDD:   03/16/14
 U/S Today:     35w 5d                                        EDD:   03/30/14
 Best:          37w 5d     Det. By:  LMP  (06/09/13)          EDD:   03/16/14
Anatomy

 Cranium:          Appears normal         LVOT:             Previously seen
 Fetal Cavum:      Previously seen        Diaphragm:        Previously seen
 Ventricles:       Appears normal         Stomach:          Appears normal, left
                                                            sided
 Choroid Plexus:   Previously seen        Abdomen:          Appears normal
 Cerebellum:       Previously seen        Abdominal Wall:   Previously seen
 Posterior Fossa:  Previously seen        Cord Vessels:     Previously seen
 Nuchal Fold:      Not applicable (>20    Kidneys:          Appear normal
                   wks GA)
 Face:             Orbits and profile     Bladder:          Appears normal
                   previously seen
 Lips:             Previously seen        Spine:            Previously seen
 Heart:            Appears normal         Lower             Previously seen
                   (4CH, axis, and        Extremities:
                   situs)
 RVOT:             Previously seen        Upper             Previously seen
                                          Extremities:

 Other:  Female gender. Technically difficult due to maternal habitus and fetal
         position.
Cervix Uterus Adnexa

 Cervix:       Not visualized (advanced GA >34 wks)
 Uterus:       No abnormality visualized.

 Left Ovary:    No adnexal mass visualized.
 Right Ovary:   No adnexal mass visualized.

 Adnexa:     No abnormality visualized.
Impression

 Single IUP at 37 [DATE] weeks
 Chronic hypertension
 Interval growth is appropriate (47th %tile)
 Active fetus with BPP of [DATE]
 Normal amniotic fluid volume
Recommendations

 Follow-up ultrasounds as clinically indicated.
 Patient tentatively scheduled for repeat C-section later this
 week

 questions or concerns.

## 2015-06-22 ENCOUNTER — Encounter (HOSPITAL_COMMUNITY): Payer: Self-pay | Admitting: *Deleted

## 2015-06-22 ENCOUNTER — Emergency Department (HOSPITAL_COMMUNITY)
Admission: EM | Admit: 2015-06-22 | Discharge: 2015-06-22 | Disposition: A | Discharge status: Home / Self Care | Payer: Medicaid Other | Attending: Emergency Medicine | Admitting: Emergency Medicine

## 2015-06-22 DIAGNOSIS — O209 Hemorrhage in early pregnancy, unspecified: Secondary | ICD-10-CM | POA: Insufficient documentation

## 2015-06-22 DIAGNOSIS — Z8659 Personal history of other mental and behavioral disorders: Secondary | ICD-10-CM | POA: Insufficient documentation

## 2015-06-22 DIAGNOSIS — O9A212 Injury, poisoning and certain other consequences of external causes complicating pregnancy, second trimester: Secondary | ICD-10-CM | POA: Insufficient documentation

## 2015-06-22 DIAGNOSIS — Y998 Other external cause status: Secondary | ICD-10-CM | POA: Insufficient documentation

## 2015-06-22 DIAGNOSIS — Y9289 Other specified places as the place of occurrence of the external cause: Secondary | ICD-10-CM | POA: Insufficient documentation

## 2015-06-22 DIAGNOSIS — O10012 Pre-existing essential hypertension complicating pregnancy, second trimester: Secondary | ICD-10-CM | POA: Insufficient documentation

## 2015-06-22 DIAGNOSIS — W01198A Fall on same level from slipping, tripping and stumbling with subsequent striking against other object, initial encounter: Secondary | ICD-10-CM | POA: Insufficient documentation

## 2015-06-22 DIAGNOSIS — R519 Headache, unspecified: Secondary | ICD-10-CM

## 2015-06-22 DIAGNOSIS — R51 Headache: Secondary | ICD-10-CM

## 2015-06-22 DIAGNOSIS — O99352 Diseases of the nervous system complicating pregnancy, second trimester: Secondary | ICD-10-CM | POA: Insufficient documentation

## 2015-06-22 DIAGNOSIS — O9989 Other specified diseases and conditions complicating pregnancy, childbirth and the puerperium: Secondary | ICD-10-CM | POA: Insufficient documentation

## 2015-06-22 DIAGNOSIS — Z880 Allergy status to penicillin: Secondary | ICD-10-CM | POA: Insufficient documentation

## 2015-06-22 DIAGNOSIS — Y9389 Activity, other specified: Secondary | ICD-10-CM | POA: Insufficient documentation

## 2015-06-22 DIAGNOSIS — G43909 Migraine, unspecified, not intractable, without status migrainosus: Secondary | ICD-10-CM | POA: Insufficient documentation

## 2015-06-22 DIAGNOSIS — Z3A17 17 weeks gestation of pregnancy: Secondary | ICD-10-CM | POA: Insufficient documentation

## 2015-06-22 DIAGNOSIS — S300XXA Contusion of lower back and pelvis, initial encounter: Secondary | ICD-10-CM | POA: Insufficient documentation

## 2015-06-22 DIAGNOSIS — Z862 Personal history of diseases of the blood and blood-forming organs and certain disorders involving the immune mechanism: Secondary | ICD-10-CM | POA: Insufficient documentation

## 2015-06-22 LAB — CBC WITH DIFFERENTIAL/PLATELET
BASOS PCT: 0 % (ref 0–1)
Basophils Absolute: 0 10*3/uL (ref 0.0–0.1)
EOS ABS: 0.3 10*3/uL (ref 0.0–0.7)
Eosinophils Relative: 4 % (ref 0–5)
HEMATOCRIT: 34.2 % — AB (ref 36.0–46.0)
HEMOGLOBIN: 11.5 g/dL — AB (ref 12.0–15.0)
LYMPHS ABS: 2.2 10*3/uL (ref 0.7–4.0)
Lymphocytes Relative: 30 % (ref 12–46)
MCH: 26.7 pg (ref 26.0–34.0)
MCHC: 33.6 g/dL (ref 30.0–36.0)
MCV: 79.4 fL (ref 78.0–100.0)
MONOS PCT: 9 % (ref 3–12)
Monocytes Absolute: 0.6 10*3/uL (ref 0.1–1.0)
NEUTROS ABS: 4.2 10*3/uL (ref 1.7–7.7)
NEUTROS PCT: 57 % (ref 43–77)
Platelets: 302 10*3/uL (ref 150–400)
RBC: 4.31 MIL/uL (ref 3.87–5.11)
RDW: 13.6 % (ref 11.5–15.5)
WBC: 7.4 10*3/uL (ref 4.0–10.5)

## 2015-06-22 LAB — COMPREHENSIVE METABOLIC PANEL
ALBUMIN: 3.3 g/dL — AB (ref 3.5–5.0)
ALK PHOS: 61 U/L (ref 38–126)
ALT: 16 U/L (ref 14–54)
AST: 21 U/L (ref 15–41)
Anion gap: 7 (ref 5–15)
BUN: 11 mg/dL (ref 6–20)
CO2: 26 mmol/L (ref 22–32)
Calcium: 8.9 mg/dL (ref 8.9–10.3)
Chloride: 106 mmol/L (ref 101–111)
Creatinine, Ser: 0.88 mg/dL (ref 0.44–1.00)
GFR calc Af Amer: >60 mL/min (ref >60)
Glucose, Bld: 99 mg/dL (ref 65–99)
POTASSIUM: 3.9 mmol/L (ref 3.5–5.1)
SODIUM: 139 mmol/L (ref 135–145)
Total Bilirubin: <0.1 mg/dL — ABNORMAL LOW (ref 0.3–1.2)
Total Protein: 7.2 g/dL (ref 6.5–8.1)

## 2015-06-22 LAB — HCG, QUANTITATIVE, PREGNANCY

## 2015-06-22 MED ORDER — KETOROLAC TROMETHAMINE 60 MG/2ML IM SOLN
60.0000 mg | Freq: Once | INTRAMUSCULAR | Status: DC
  Filled 2015-06-22: qty 2

## 2015-06-22 MED ORDER — METOCLOPRAMIDE HCL 10 MG PO TABS
10.0000 mg | ORAL_TABLET | Freq: Once | ORAL | Status: AC
  Administered 2015-06-22: 10 mg via ORAL
  Filled 2015-06-22: qty 1

## 2015-06-22 MED ORDER — METHOCARBAMOL 500 MG PO TABS: 1000.0000 mg | ORAL_TABLET | Freq: Three times a day (TID) | ORAL | Status: DC | PRN

## 2015-06-22 MED ORDER — IBUPROFEN 600 MG PO TABS
600.0000 mg | ORAL_TABLET | Freq: Four times a day (QID) | ORAL | Status: DC | PRN
Start: 2015-06-22 — End: 2015-08-16

## 2015-06-22 MED ORDER — IBUPROFEN 200 MG PO TABS
600.0000 mg | ORAL_TABLET | Freq: Once | ORAL | Status: AC
  Administered 2015-06-22: 600 mg via ORAL
  Filled 2015-06-22: qty 3

## 2015-06-22 MED ORDER — METOCLOPRAMIDE HCL 10 MG PO TABS: 10.0000 mg | ORAL_TABLET | Freq: Four times a day (QID) | ORAL | Status: DC | PRN

## 2015-06-22 MED ORDER — METHOCARBAMOL 500 MG PO TABS
1000.0000 mg | ORAL_TABLET | Freq: Once | ORAL | Status: AC
  Administered 2015-06-22: 1000 mg via ORAL
  Filled 2015-06-22: qty 2

## 2015-06-22 NOTE — ED Notes (Addendum)
Pt refusing "unneccesary blood work"; MD notified and agrees to speak with patient; urine cup placed at bedside and RN requested urine sample from patient

## 2015-06-22 NOTE — ED Notes (Signed)
Pt requesting est time til discharge; MD continues to request urine sample; pt states she still cannot go at this time
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
## 2015-06-22 NOTE — ED Notes (Signed)
The pt now reports that she is [redacted] weeks pregnant

## 2015-06-22 NOTE — ED Notes (Signed)
The pt is c/o a migraine headache and back pain for 2-3 days   lmp none

## 2015-06-22 NOTE — ED Provider Notes (Signed)
CSN: [id number]     Arrival date & time 06/22/15  0045 History   This chart was scribed for [name] Raceravid Terrian Ridlon, MD by Arlan OrganAshley Leger, ED Scribe. This patient was seen in room A02C/A02C and the patient's care was started 1:16 AM.   Chief Complaint  Patient presents with  . Headache   The history is provided by the patient. No language interpreter was used.    HPI Comments: [name] [name], currently [redacted] weeks gestation is a 34 y.o. female with a PMHx of sickle cell trait and HTN who presents to the Emergency Department complaining of constant, ongoing, unchanged frontal HA x 2 days. Pain is described as a "tingling". OTC Excedrin, Ibuprofen, and Tylenol attempted without any improvement for symptoms. Pt also reports constant, ongoing R sided lower back pain x 2-3 days. Pain is made worse with palpation and movement. No alleviating factors at this time. Pt states she sustained a fall a few days ago landing on a dresser against her back. No weakness, numbness, or loss of sensation. No fever, chills, abdominal pain, nausea, vomiting. [name] has concern for a possible miscarriage as she experienced ongoing vaginal bleeding consisting of clots yesterday. Bleeding is still ongoing today, however, she states bleeding is now minimal in comparision to yesterday. LNMP December 2015. Pt with known allergy to Penicillins.  Past Medical History  Diagnosis Date  . Sickle cell trait   . Pregnant   . Hypertension     Hx PIH with 2002 preg, no current problems  . Depression     PTSD (1997) major depression (2013) - no meds  . Migraine     otc med prn   Past Surgical History  Procedure Laterality Date  . Cesarean section      x 6  . Wisdom tooth extraction    . Cesarean section with bilateral tubal ligation Bilateral 03/02/2014    Procedure: REPEAT CESAREAN SECTION WITH BILATERAL TUBAL LIGATION;  Surgeon: Allie BossierMyra C Dove, MD;  Location: [facility];  Service: Obstetrics;  Laterality: Bilateral;   Family  History  Problem Relation Age of Onset  . Hypertension Mother   . Hypertension Father   . Diabetes Father    History  Substance Use Topics  . Smoking status: Never Smoker   . Smokeless tobacco: Never Used  . Alcohol Use: No     Comment: ocassionaly before pregnancy   OB History    Gravida Para Term Preterm AB TAB SAB Ectopic Multiple Living   7 6 6  1  1   6      Review of Systems  Constitutional: Negative for fever and chills.  Respiratory: Negative for cough and shortness of breath.   Gastrointestinal: Negative for nausea, vomiting and abdominal pain.  Genitourinary: Positive for vaginal bleeding. Negative for dysuria, flank pain, vaginal discharge, difficulty urinating and pelvic pain.  Musculoskeletal: Positive for back pain. Negative for neck pain.  Skin: Negative for rash and wound.  Neurological: Positive for headaches. Negative for dizziness, weakness, light-headedness and numbness.  All other systems reviewed and are negative.     Allergies  Fish allergy and Penicillins  Home Medications   Prior to Admission medications   Medication Sig Start Date End Date Taking? Authorizing Provider  ibuprofen (ADVIL,MOTRIN) 600 MG tablet Take 1 tablet (600 mg total) by mouth every 6 (six) hours as needed. 06/22/15   [name] Raceravid Peni Rupard, MD  methocarbamol (ROBAXIN) 500 MG tablet Take 2 tablets (1,000 mg total) by mouth every 8 (eight) hours  as needed. 06/22/15   [name] Racer, MD  metoCLOPramide (REGLAN) 10 MG tablet Take 1 tablet (10 mg total) by mouth every 6 (six) hours as needed for nausea (nausea/headache). 06/22/15   [name] Racer, MD   Triage Vitals: BP 118/71 mmHg  Pulse 84  Temp(Src) 97.8 F (36.6 C) (Oral)  Resp 16  Ht  (1.727 m)  Wt 206 lb 4 oz (93.554 kg)  BMI 31.37 kg/m2  SpO2 98%  Breastfeeding? No   Physical Exam  Constitutional: She is oriented to person, place, and time. She appears well-developed and well-nourished. No distress.  HENT:  Head:  Normocephalic and atraumatic.  Mouth/Throat: Oropharynx is clear and moist.  Bilateral nasal mucosal edema. No sinus tenderness with percussion.  Eyes: EOM are normal. Pupils are equal, round, and reactive to light.  Neck: Normal range of motion. Neck supple.  No meningismus  Cardiovascular: Normal rate and regular rhythm.   Pulmonary/Chest: Effort normal and breath sounds normal. No respiratory distress. She has no wheezes. She has no rales.  Abdominal: Soft. Bowel sounds are normal. She exhibits no distension and no mass. There is no tenderness. There is no rebound and no guarding.  Musculoskeletal: Normal range of motion. She exhibits tenderness (right-sided paraspinal lumbar tenderness with palpation. No midline tenderness. No CVA tenderness.). She exhibits no edema.  Neurological: She is alert and oriented to person, place, and time.  Patient is alert and oriented x3 with clear, goal oriented speech. Patient has 5/5 motor in all extremities. Sensation is intact to light touch. Bilateral finger-to-nose is normal with no signs of dysmetria.   Skin: Skin is warm and dry. No rash noted. No erythema.  Psychiatric: She has a normal mood and affect. Her behavior is normal.  Nursing note and vitals reviewed.   ED Course  Procedures (including critical care time)  DIAGNOSTIC STUDIES: Oxygen Saturation is 98% on RA, Normal by my interpretation.    COORDINATION OF CARE: 1:20 AM- Will order CBC, CMP, urinalysis, and hCG. Discussed treatment plan with pt at bedside and pt agreed to plan.     Labs Review Labs Reviewed  CBC WITH DIFFERENTIAL/PLATELET - Abnormal; Notable for the following:    Hemoglobin 11.5 (*)    HCT 34.2 (*)    All other components within normal limits  COMPREHENSIVE METABOLIC PANEL - Abnormal; Notable for the following:    Albumin 3.3 (*)    Total Bilirubin <0.1 (*)    All other components within normal limits  HCG, QUANTITATIVE, PREGNANCY  URINALYSIS, ROUTINE W REFLEX  MICROSCOPIC (NOT AT [facility])    Imaging Review No results found.   EKG Interpretation None      MDM   Final diagnoses:  Nonintractable headache, unspecified chronicity pattern, unspecified headache type  Lumbar contusion, initial encounter    I personally performed the services described in this documentation, which was scribed in my presence. The recorded information has been reviewed and is accurate.  Patient with improved headache. Normal neurologic exam. Hemoglobin is normal. Beta hCG Quant less than 1. Patient is not willing to wait for urinary analysis. Doubt pyelonephritis as a cause of her back pain. Requesting discharge home. Advised to follow-up with her OB/GYN. Return precautions given.  [name] Racer, MD 06/22/15 [phone]

## 2015-06-22 NOTE — ED Notes (Signed)
Ranae PalmsYelverton, MD at bedside explaining plan of care

## 2015-06-22 NOTE — ED Notes (Signed)
Yelverton, MD at bedside. 

## 2015-08-16 ENCOUNTER — Emergency Department (HOSPITAL_COMMUNITY): Payer: Medicaid Other

## 2015-08-16 ENCOUNTER — Emergency Department (HOSPITAL_COMMUNITY)
Admission: EM | Admit: 2015-08-16 | Discharge: 2015-08-17 | Disposition: A | Discharge status: Home / Self Care | Payer: Medicaid Other | Attending: Emergency Medicine | Admitting: Emergency Medicine

## 2015-08-16 ENCOUNTER — Encounter (HOSPITAL_COMMUNITY): Payer: Self-pay | Admitting: *Deleted

## 2015-08-16 DIAGNOSIS — Z8659 Personal history of other mental and behavioral disorders: Secondary | ICD-10-CM | POA: Insufficient documentation

## 2015-08-16 DIAGNOSIS — R51 Headache: Secondary | ICD-10-CM | POA: Insufficient documentation

## 2015-08-16 DIAGNOSIS — R1030 Lower abdominal pain, unspecified: Secondary | ICD-10-CM | POA: Insufficient documentation

## 2015-08-16 DIAGNOSIS — R519 Headache, unspecified: Secondary | ICD-10-CM

## 2015-08-16 DIAGNOSIS — Z880 Allergy status to penicillin: Secondary | ICD-10-CM | POA: Insufficient documentation

## 2015-08-16 DIAGNOSIS — Z3202 Encounter for pregnancy test, result negative: Secondary | ICD-10-CM | POA: Insufficient documentation

## 2015-08-16 DIAGNOSIS — I10 Essential (primary) hypertension: Secondary | ICD-10-CM | POA: Insufficient documentation

## 2015-08-16 DIAGNOSIS — Z862 Personal history of diseases of the blood and blood-forming organs and certain disorders involving the immune mechanism: Secondary | ICD-10-CM | POA: Insufficient documentation

## 2015-08-16 DIAGNOSIS — M940 Chondrocostal junction syndrome [Tietze]: Secondary | ICD-10-CM | POA: Insufficient documentation

## 2015-08-16 DIAGNOSIS — Z79899 Other long term (current) drug therapy: Secondary | ICD-10-CM | POA: Insufficient documentation

## 2015-08-16 LAB — BASIC METABOLIC PANEL
Anion gap: 7 (ref 5–15)
BUN: 8 mg/dL (ref 6–20)
CALCIUM: 8.5 mg/dL — AB (ref 8.9–10.3)
CO2: 25 mmol/L (ref 22–32)
CREATININE: 0.84 mg/dL (ref 0.44–1.00)
Chloride: 107 mmol/L (ref 101–111)
GFR calc Af Amer: >60 mL/min (ref >60)
Glucose, Bld: 112 mg/dL — ABNORMAL HIGH (ref 65–99)
Potassium: 3.6 mmol/L (ref 3.5–5.1)
SODIUM: 139 mmol/L (ref 135–145)

## 2015-08-16 LAB — CBC
HCT: 36 % (ref 36.0–46.0)
Hemoglobin: 11.7 g/dL — ABNORMAL LOW (ref 12.0–15.0)
MCH: 25.9 pg — ABNORMAL LOW (ref 26.0–34.0)
MCHC: 32.5 g/dL (ref 30.0–36.0)
MCV: 79.8 fL (ref 78.0–100.0)
PLATELETS: 320 10*3/uL (ref 150–400)
RBC: 4.51 MIL/uL (ref 3.87–5.11)
RDW: 13.9 % (ref 11.5–15.5)
WBC: 6.8 10*3/uL (ref 4.0–10.5)

## 2015-08-16 LAB — URINALYSIS, ROUTINE W REFLEX MICROSCOPIC
BILIRUBIN URINE: NEGATIVE
Glucose, UA: NEGATIVE mg/dL
Hgb urine dipstick: NEGATIVE
KETONES UR: NEGATIVE mg/dL
Leukocytes, UA: NEGATIVE
Nitrite: NEGATIVE
PH: 5.5 (ref 5.0–8.0)
Protein, ur: NEGATIVE mg/dL
Specific Gravity, Urine: 1.014 (ref 1.005–1.030)
UROBILINOGEN UA: 0.2 mg/dL (ref 0.0–1.0)

## 2015-08-16 LAB — POC URINE PREG, ED: PREG TEST UR: NEGATIVE

## 2015-08-16 MED ORDER — DIPHENHYDRAMINE HCL 25 MG PO CAPS
25.0000 mg | ORAL_CAPSULE | Freq: Once | ORAL | Status: AC
  Administered 2015-08-17: 25 mg via ORAL
  Filled 2015-08-16: qty 1

## 2015-08-16 MED ORDER — METOCLOPRAMIDE HCL 5 MG/ML IJ SOLN
10.0000 mg | Freq: Once | INTRAMUSCULAR | Status: DC
  Filled 2015-08-16: qty 2

## 2015-08-16 MED ORDER — IBUPROFEN 400 MG PO TABS
600.0000 mg | ORAL_TABLET | Freq: Once | ORAL | Status: AC
  Administered 2015-08-17: 600 mg via ORAL
  Filled 2015-08-16 (×2): qty 1

## 2015-08-16 MED ORDER — KETOROLAC TROMETHAMINE 30 MG/ML IJ SOLN
30.0000 mg | Freq: Once | INTRAMUSCULAR | Status: DC
  Filled 2015-08-16: qty 1

## 2015-08-16 MED ORDER — METOCLOPRAMIDE HCL 10 MG PO TABS
10.0000 mg | ORAL_TABLET | Freq: Once | ORAL | Status: AC
  Administered 2015-08-17: 10 mg via ORAL
  Filled 2015-08-16: qty 1

## 2015-08-16 NOTE — ED Provider Notes (Signed)
CSN: [id number]     Arrival date & time 08/16/15  2117 History   First MD Initiated Contact with Patient 08/16/15 2327     Chief Complaint  Patient presents with  . Migraine  . Chest Pain     (Consider location/radiation/quality/duration/timing/severity/associated sxs/prior Treatment) HPI Comments: This is a 34 year female with a history of migraine headaches presents today with 2 days worth of headache that is typical of her she's tried over-the-counter medications without relief.  She also reports that she has right upper chest pain that is reproducible, similar to the past episodes of costochondritis that she has had.  Denies any recent travel, use of any kind of hormone replacement.  She does not smoke.    Patient is a 34 y.o. female presenting with migraines and chest pain. The history is provided by the patient.  Migraine This is a recurrent problem. The current episode started yesterday. The problem occurs intermittently. The problem has been unchanged. Associated symptoms include abdominal pain and chest pain. Pertinent negatives include no congestion, coughing, nausea, rash or visual change. Nothing aggravates the symptoms. She has tried nothing for the symptoms. The treatment provided no relief.  Chest Pain Pain location:  R lateral chest Pain quality: aching   Pain radiates to:  Does not radiate Pain radiates to the back: no   Pain severity:  Mild Onset quality:  Unable to specify Timing:  Intermittent Context: lifting and raising an arm   Relieved by:  Nothing Worsened by:  Nothing tried Ineffective treatments:  None tried Associated symptoms: abdominal pain   Associated symptoms: no cough, no nausea, no palpitations and no shortness of breath   Risk factors: obesity   Risk factors: not pregnant     Past Medical History  Diagnosis Date  . Sickle cell trait   . Pregnant   . Hypertension     Hx PIH with 2002 preg, no current problems  . Depression     PTSD (1997)  major depression (2013) - no meds  . Migraine     otc med prn   Past Surgical History  Procedure Laterality Date  . Cesarean section      x 6  . Wisdom tooth extraction    . Cesarean section with bilateral tubal ligation Bilateral 03/02/2014    Procedure: REPEAT CESAREAN SECTION WITH BILATERAL TUBAL LIGATION;  Surgeon: Allie Bossier, MD;  Location: [facility];  Service: Obstetrics;  Laterality: Bilateral;   Family History  Problem Relation Age of Onset  . Hypertension Mother   . Hypertension Father   . Diabetes Father    Social History  Substance Use Topics  . Smoking status: Never Smoker   . Smokeless tobacco: Never Used  . Alcohol Use: No     Comment: ocassionaly before pregnancy   OB History    Gravida Para Term Preterm AB TAB SAB Ectopic Multiple Living   Review of Systems  HENT: Negative for congestion.   Respiratory: Negative for cough and shortness of breath.   Cardiovascular: Positive for chest pain. Negative for palpitations.  Gastrointestinal: Positive for abdominal pain. Negative for nausea.  Skin: Negative for rash and wound.      Allergies  Fish allergy and Penicillins  Home Medications   Prior to Admission medications   Medication Sig Start Date End Date Taking? Authorizing Provider  diphenhydrAMINE (BENADRYL) 25 mg capsule Take 1 capsule (25 mg total)  by mouth every 8 (eight) hours as needed. 08/17/15   Earley Favor, NP  ibuprofen (ADVIL,MOTRIN) 600 MG tablet Take 1 tablet (600 mg total) by mouth 3 (three) times daily. 08/17/15   Earley Favor, NP  metoCLOPramide (REGLAN) 10 MG tablet Take 1 tablet (10 mg total) by mouth every 6 (six) hours as needed for nausea. 08/17/15   Earley Favor, NP   BP 125/82 mmHg  Pulse 82  Temp(Src) 98.4 F (36.9 C) (Oral)  Resp 18  Ht 5\' 4"  (1.626 m)  Wt 211 lb 3.2 oz (95.8 kg)  BMI 36.23 kg/m2  SpO2 100%  LMP 06/20/2015 Physical Exam  Constitutional: She is oriented to person, place, and time. She appears  well-developed and well-nourished.  HENT:  Head: Normocephalic.  Right Ear: External ear normal.  Left Ear: External ear normal.  Mouth/Throat: Oropharynx is clear and moist.  Eyes: Pupils are equal, round, and reactive to light.  Neck: Normal range of motion.  Cardiovascular: Normal rate and regular rhythm.   Pulmonary/Chest: Effort normal and breath sounds normal. She exhibits tenderness.  Musculoskeletal: She exhibits no edema.  Neurological: She is alert and oriented to person, place, and time.  Skin: Skin is warm. No rash noted.    ED Course  Procedures (including critical care time) Labs Review Labs Reviewed  BASIC METABOLIC PANEL - Abnormal; Notable for the following:    Glucose, Bld 112 (*)    Calcium 8.5 (*)    All other components within normal limits  CBC - Abnormal; Notable for the following:    Hemoglobin 11.7 (*)    MCH 25.9 (*)    All other components within normal limits  URINALYSIS, ROUTINE W REFLEX MICROSCOPIC (NOT AT [facility])  POC URINE PREG, ED    Imaging Review Dg Chest 2 View  08/16/2015   CLINICAL DATA:  Chest pain for 2 days  EXAM: CHEST  2 VIEW  COMPARISON:  08/01/2008  FINDINGS: The heart size and mediastinal contours are within normal limits. Both lungs are clear. The visualized skeletal structures are unremarkable.  IMPRESSION: No active cardiopulmonary disease.   Electronically Signed   By: Ellery Plunk M.D.   On: 08/16/2015 22:53   I have personally reviewed and evaluated these images and lab results as part of my medical decision-making.   EKG Interpretation None     patient has refused IV or IM medication, stating she just wants something in the pill form.  She will be given 8 600 mg of ibuprofen 10 mg of Reglan and 25 mg of Benadryl and be discharged home to follow-up with her primary care physician  MDM   Final diagnoses:  Nonintractable headache, unspecified chronicity pattern, unspecified headache type  Costochondritis          Earley Favor, NP 08/17/15 0010  Bethann Berkshire, MD 08/17/15 1505

## 2015-08-16 NOTE — ED Notes (Signed)
Patient presents stating she has a migraine headache and the right side of her chest has been hurting

## 2015-08-17 MED ORDER — METOCLOPRAMIDE HCL 10 MG PO TABS: 10.0000 mg | ORAL_TABLET | Freq: Four times a day (QID) | ORAL | Status: DC | PRN

## 2015-08-17 MED ORDER — DIPHENHYDRAMINE HCL 25 MG PO CAPS: 25.0000 mg | ORAL_CAPSULE | Freq: Three times a day (TID) | ORAL | Status: DC | PRN

## 2015-08-17 MED ORDER — IBUPROFEN 600 MG PO TABS: 600.0000 mg | ORAL_TABLET | Freq: Three times a day (TID) | ORAL | Status: DC

## 2015-08-17 NOTE — Discharge Instructions (Signed)
°Emergency Department Resource Guide °1) Find a Doctor and Pay Out of Pocket °Although you won't have to find out who is covered by your insurance plan, it is a good idea to ask around and get recommendations. You will then need to call the office and see if the doctor you have chosen will accept you as a new patient and what types of options they offer for patients who are self-pay. Some doctors offer discounts or will set up payment plans for their patients who do not have insurance, but you will need to ask so you aren't surprised when you get to your appointment. ° °2) Contact Your Local Health Department °Not all health departments have doctors that can see patients for sick visits, but many do, so it is worth a call to see if yours does. If you don't know where your local health department is, you can check in your phone book. The CDC also has a tool to help you locate your state's health department, and many state websites also have listings of all of their local health departments. ° °3) Find a Walk-in Clinic °If your illness is not likely to be very severe or complicated, you may want to try a walk in clinic. These are popping up all over the country in pharmacies, drugstores, and shopping centers. They're usually staffed by nurse practitioners or physician assistants that have been trained to treat common illnesses and complaints. They're usually fairly quick and inexpensive. However, if you have serious medical issues or chronic medical problems, these are probably not your best option. ° °No Primary Care Doctor: °- Call [facility] at  832-8000 - they can help you locate a primary care doctor that  accepts your insurance, provides certain services, etc. °- Physician Referral Service- 1-800-533-3463 ° °Chronic Pain Problems: °Organization         Address  Phone   Notes  °[facility]  (336) 297-2271 Patients need to be referred by their primary care doctor.  ° °Medication  Assistance: °Organization         Address  Phone   Notes  °[place] [facility] [address] °[place], [place] [address] (336) 641-8030 --Must be a resident of [place] °-- Must have NO insurance coverage whatsoever (no Medicaid/ Medicare, etc.) °-- The pt. MUST have a primary care doctor that directs their care regularly and follows them in the community °  °[facility]  (866) 331-1348   °[employer]  (888) 892-1162   ° °Agencies that provide inexpensive medical care: °Organization         Address  Phone   Notes  °[facility]  (336) 832-8035   °[facility]    (336) 832-7272   °[facility] [address] °[place], [place] [address] (336) 832-4777   °[facility] [address], °[place] (336) 271-4999   °[facility]    (336) 373-0678   °[facility]    (336) 272-1050   °[facility] ° [address], [place] Phone:  (336) 832-4444, Fax:  (336) 832-4440 Hours of Operation:  9 am - 6 pm, M-F.  Also accepts Medicaid/Medicare and self-pay.  °[facility] ° [address],  Phone: (336) 832-3150, Fax: (336) 832-3151. Hours of Operation:  8:30 am - 5:30 pm, M-F.  Also accepts Medicaid and self-pay.  °[facility] [place] [address], [place] Phone: (336) 878-6027   °[facility] [address], [place], [place] (336)723-1848, Ext. 123 Mondays & Thursdays: 7-9 AM.  First 15 patients are seen on a first come, first serve basis. °  ° °Medicaid-accepting [place] Providers: ° °Organization         Address  Phone   Notes  °[facility] [address], [place] (336) 641-2100 Also accepts self-pay patients.  °[facility] [address], [place] ° (336) 856-9996   °[facility] [address], [place]  (336) 288-8857   °[facility] [address] [place] [address], [place] (336) 299-7000   °Veita Bland [address], [place]  ° (336) 373-1557 Only accepts  Access Medicaid patients after they have their name applied to their card.  ° °Self-Pay (no insurance) in [place]: ° °Organization         Address  Phone   Notes  °Sickle Cell Patients, [facility] [address], [place] (336) 832-1970   °[facility] [address], [place] (336) 832-4400   °[facility] ° [address], [place] (336) 992-4800   °[facility]/Dr. Osei-Bonsu ° [address] [place] [address], [place] or [address], [place] (336) 841-8500 Phone number for both [place] and [place] locations is the same.  °[facility] [address], [place] (336) 299-0000   °[facility] [address] [place] [address], [place] or [address] (336) 852-7530 °(336) 878-2260   °[facility] [address], [place] (336) 350-1642, phone; (336) 294-5005, fax Sees patients 1st and 3rd Saturday of every month.  Must not qualify for public or private insurance (i.e. Medicaid, Medicare, [place] Health Choice, Veterans' Benefits) • Household income should be no more than 200% of the poverty level •The clinic cannot treat you if you are pregnant or think you are pregnant • Sexually transmitted diseases are not treated at the clinic.  ° ° °Dental Care: °Organization         Address  Phone  Notes  °[place] [facility] [address], [place] (336) 641-6152 Accepts children up to age 21 who are enrolled in Medicaid or [place] Health Choice; pregnant women with a Medicaid card; and children who have applied for Medicaid or [place] Health Choice, but were declined, whose parents can pay a reduced fee at time of service.  °[place]  [facility] [place]  [address], [place] (336) 641-7733 Accepts children up to age 21 who are enrolled in Medicaid or [place] Health Choice; pregnant women with a Medicaid card; and children who have applied for Medicaid or  Health Choice, but were declined, whose parents can pay a reduced fee at time of service.  °[facility] ° [address],  (336) 641-4533 Patients are seen by appointment only. Walk-ins are not accepted. [facility] will see patients 18 years of age and older. °Monday - Tuesday (8am-5pm) °Most Wednesdays (8:30-5pm) °$30 per visit, cash only  °[facility] ° [address], [place] (336) 641-4533 Patients are seen by appointment only. Walk-ins are not accepted. [facility] will see patients 18 years of age and older. °One   Wednesday Evening (Monthly: Volunteer Based).  $30 per visit, cash only  °[facility]  (919) 537-3737 for adults; Children under age 4, call Graduate Pediatric Dentistry at (919) [phone]. Children aged 4-14, please call (919) [phone] to request a pediatric application. ° Dental services are provided in all areas of dental care including fillings, crowns and bridges, complete and partial dentures, implants, gum treatment, root canals, and extractions. Preventive care is also provided. Treatment is provided to both adults and children. °Patients are selected via a lottery and there is often a waiting list. °  °[facility] [address], °[place] ° (336) 763-8833 [email or web address] °  °[facility] [address], [place], [place] (336)723-1848, Ext. 123 Second and Fourth Thursday of each month, opens at 6:30 AM; Clinic ends at 9 AM.  Patients are seen on a first-come first-served basis, and a limited number are seen during each clinic.  ° °[facility] ° [address], [place], [place] (336) 723-7904    Eligibility Requirements °You must have lived in [place], or [place] counties for at least the last three months. °  You cannot be eligible for state or federal sponsored healthcare insurance, including Veterans Administration, Medicaid, or Medicare. °  You generally cannot be eligible for healthcare insurance through your employer.  °  How to apply: °Eligibility screenings are held every Tuesday and Wednesday afternoon from 1:00 pm until 4:00 pm. You do not need an appointment for the interview!  °[facility] [address], [place], [place] 336-631-2330   °[place] [facility]  336-342-8273   °[facility]  336-703-3100   °[facility]  336-570-6415   ° °Behavioral Health Resources in the Community: °Intensive Outpatient Programs °Organization         Address  Phone  Notes  °[facility] [address], [place], [place] 336-878-6098   °[facility] [address], [place], [place] 336-832-9800   °[facility] [address], [place], [place] ° 336-882-2125   °[facility] [address],  °[place], [place] 1-800-853-5163 or 336-641-4981   °Substance Abuse Resources °Organization         Address  Phone  Notes  °[facility]  336-882-2125   °[facility]  336-784-9470   °[facility]  336-285-9073   °[facility]  336-845-3988   °Residential & Outpatient Substance Abuse Program  1-800-659-3381   °Psychological Services °Organization         Address  Phone  Notes  °[facility]  336- 832-9600   °[facility]  336- 378-7881   °[facility] [address], [place] 1-800-853-5163 or 336-641-4981   ° °Mobile Crisis Teams °Organization         Address  Phone  Notes  °[facility], Mobile Crisis Care Unit  1-877-626-1772   °Assertive °[facility] ° [address]  [place], [place] 336-834-9664   °Sharon DeEsch [address] °[place] 336-554-5454   ° °Self-Help/Support Groups °Organization         Address  Phone             Notes  °[facility] [place] - variety of support groups  336- 373-1402 Call for more information  °Narcotics Anonymous (NA), [facility] [address], °[place]  2 meetings at this location  ° °  Residential Treatment Programs °Organization         Address  Phone  Notes  °[facility] [address],    °[place]  1-866-801-8205   °[facility] ° [address], [place], [place] 704-293-8524   °[facility] [address], [place] 336-845-3988 Admissions: 8am-3pm M-F  °[facility] [address]    °[place], [place] 336-841-1104   °[facility] [address], [place], [place] 336-379-7146   °[facility] [address]  °[place], [place] 336-285-9073   °[facility] Intensive Outpatient [address], [place], [place] 336-852-3033   °[facility] [address]  °[place], [place] 1-877-615-2722 or 336-784-9470   °[facility] [address] [place], [place] 336-227-7417 Accepts Medicaid  °[facility] [address]  °[place] 1-800-659-3381 Substance Abuse/Addiction Treatment  ° °[place] Behavioral Health Resources °Organization         Address  Phone  Notes  °[facility]  (888) 581-9988   °Julie Brannon, PhD [address] [place], [place]   (336) 349-5553 or (336) 951-0000   °[facility]   [address] °[place], [place] (336) 349-4454   °[facility] [address], [place], [place] (336) 342-8316 Insurance/Medicaid/sponsorship through [facility]  °[facility] [address]                                    [place], [place] (336) 342-8316 Therapy/tele-psych/case    °[facility] [address]  ° [place],  (336) 349-2233    °Dr. Arfeen  (336) 349-4544   °[facility] [place]  [employer] [place] [facility] 1) [address], [place] °2) [address], [place] °3)  [address], [place] (336) 349-3220 °(336) 342-7768 ° °(336) 342-8140   °[place] [facility] (336) 342-1394 or (336) 342-3537 (After Hours)    ° ° °

## 2015-08-17 NOTE — ED Notes (Signed)
Pt stable, ambulatory, states understanding of discharge instructions 

## 2015-08-28 ENCOUNTER — Emergency Department (HOSPITAL_COMMUNITY): Payer: Medicaid Other

## 2015-08-28 ENCOUNTER — Encounter (HOSPITAL_COMMUNITY): Payer: Self-pay | Admitting: Emergency Medicine

## 2015-08-28 ENCOUNTER — Emergency Department (HOSPITAL_COMMUNITY)
Admission: EM | Admit: 2015-08-28 | Discharge: 2015-08-28 | Disposition: A | Discharge status: Home / Self Care | Payer: Medicaid Other | Attending: Emergency Medicine | Admitting: Emergency Medicine

## 2015-08-28 DIAGNOSIS — W228XXA Striking against or struck by other objects, initial encounter: Secondary | ICD-10-CM | POA: Insufficient documentation

## 2015-08-28 DIAGNOSIS — Y9389 Activity, other specified: Secondary | ICD-10-CM | POA: Insufficient documentation

## 2015-08-28 DIAGNOSIS — Y998 Other external cause status: Secondary | ICD-10-CM | POA: Insufficient documentation

## 2015-08-28 DIAGNOSIS — Z862 Personal history of diseases of the blood and blood-forming organs and certain disorders involving the immune mechanism: Secondary | ICD-10-CM | POA: Insufficient documentation

## 2015-08-28 DIAGNOSIS — Z8659 Personal history of other mental and behavioral disorders: Secondary | ICD-10-CM | POA: Insufficient documentation

## 2015-08-28 DIAGNOSIS — S90852A Superficial foreign body, left foot, initial encounter: Secondary | ICD-10-CM | POA: Insufficient documentation

## 2015-08-28 DIAGNOSIS — I10 Essential (primary) hypertension: Secondary | ICD-10-CM | POA: Insufficient documentation

## 2015-08-28 DIAGNOSIS — Y9289 Other specified places as the place of occurrence of the external cause: Secondary | ICD-10-CM | POA: Insufficient documentation

## 2015-08-28 DIAGNOSIS — Z880 Allergy status to penicillin: Secondary | ICD-10-CM | POA: Insufficient documentation

## 2015-08-28 DIAGNOSIS — Z23 Encounter for immunization: Secondary | ICD-10-CM | POA: Insufficient documentation

## 2015-08-28 MED ORDER — HYDROCODONE-ACETAMINOPHEN 5-325 MG PO TABS
1.0000 | ORAL_TABLET | Freq: Once | ORAL | Status: AC
  Administered 2015-08-28: 1 via ORAL
  Filled 2015-08-28: qty 1

## 2015-08-28 MED ORDER — IBUPROFEN 400 MG PO TABS: 400.0000 mg | ORAL_TABLET | Freq: Four times a day (QID) | ORAL | Status: DC | PRN

## 2015-08-28 MED ORDER — ONDANSETRON 4 MG PO TBDP
4.0000 mg | ORAL_TABLET | Freq: Once | ORAL | Status: AC
  Administered 2015-08-28: 4 mg via ORAL
  Filled 2015-08-28: qty 1

## 2015-08-28 MED ORDER — CEPHALEXIN 250 MG PO CAPS
250.0000 mg | ORAL_CAPSULE | Freq: Once | ORAL | Status: AC
  Administered 2015-08-28: 250 mg via ORAL
  Filled 2015-08-28: qty 1

## 2015-08-28 MED ORDER — TETANUS-DIPHTH-ACELL PERTUSSIS 5-2.5-18.5 LF-MCG/0.5 IM SUSP
0.5000 mL | Freq: Once | INTRAMUSCULAR | Status: AC
  Administered 2015-08-28: 0.5 mL via INTRAMUSCULAR
  Filled 2015-08-28: qty 0.5

## 2015-08-28 MED ORDER — CEPHALEXIN 500 MG PO CAPS: 500.0000 mg | ORAL_CAPSULE | Freq: Four times a day (QID) | ORAL | Status: DC

## 2015-08-28 NOTE — Discharge Instructions (Signed)
Wood Splinters  Wood splinters need to be removed because they can cause skin irritation and infection. If they are close to the surface, splinters can usually be removed easily. Deep splinters may be hard to locate and need treatment by a surgeon.  SPLINTER REMOVAL  Removal of splinters by your caregiver is considered a surgical procedure.   · The area is carefully cleaned. You may require a small amount of anesthesia (medicine injected near the splinter to numb the tissue and lessen pain). After the splinter is removed, the area will be cleaned again. A bandage is applied.  · If your splinter is under a fingernail or toenail, then a small section of the nail may need to be removed. As long as the splinter did not extend to the base of the nail, the nail usually grows back normally.  · A splinter that is deeper, more contaminated, or that gets near a structure such as a bone, nerve or blood vessel may need to be removed by a surgeon.  · You may need special X-rays or scans if the splinter is hard to locate.  · Every attempt is made to remove the entire splinter. However, small particles may remain. Tell your caregiver if you feel that a part of the splinter was left behind.  HOME CARE INSTRUCTIONS   · Keep the injured area high up (elevated).  · Use the injured area as little as possible.  · Keep the injured area clean and dry. Follow any directions from your caregiver.  · Keep any follow-up or wound check appointments.  You might need a tetanus shot now if:  · You have no idea when you had the last one.  · You have never had a tetanus shot before.  · The injured area had dirt in it.  Even if you have already removed the splinter, call your caregiver to get a tetanus shot if you need one.   If you need a tetanus shot, and you decide not to get one, there is a rare chance of getting tetanus. Sickness from tetanus can be serious. If you did get a tetanus shot, your arm may swell, get red and warm to the touch at the  shot site. This is common and not a problem.  SEEK MEDICAL CARE IF:   · A splinter has been removed, but you are not better in a day or two.  · You develop a temperature.  · Signs of infection develop such as:  ¨ Redness, swelling or pus around the wound.  ¨ Red streaks spreading back from your wound towards your body.  Document Released: 01/06/2005 Document Revised: 04/15/2014 Document Reviewed: 12/09/2008  ExitCare® Patient Information ©2015 [employer]. This information is not intended to replace advice given to you by your health care provider. Make sure you discuss any questions you have with your health care provider.

## 2015-08-28 NOTE — ED Notes (Signed)
Patient states tripped this morning and turned her left foot/ankle on steps.   Patient complains of pain in foot/ankle.  No swelling noted.

## 2015-08-28 NOTE — ED Provider Notes (Signed)
CSN: [id number]     Arrival date & time 08/28/15  0950 History  This chart was scribed for non-physician practitioner, Marlon Pel, PA-C working with Elwin Mocha, MD, by Jarvis [place], ED Scribe. This patient was seen in room TR06C/TR06C and the patient's care was started at 11:42 AM.     Chief Complaint  Patient presents with  . Ankle Pain  . Foot Pain    The history is provided by the patient. No language interpreter was used.    [name] [name] 34 y.o.female  PCP: Default, Provider, MD  Blood pressure 111/56, pulse 101, temperature 98.1 F (36.7 C), temperature source Oral, resp. rate 16, last menstrual period 06/20/2015, SpO2 100 %, not currently breastfeeding.  SIGNIFICANT PMH: migraines and CP CHIEF COMPLAINT: left ankle pain  When: 5: 30 am How: tripped on a baby gait and twisted her left ankle and foot Chronicity: acute Location: left ankle and foot Radiation: none Quality and severity: throbbing and burning Alleviating factors: rest Worsening factors: walking and touching it Treatments tried: ice pack Associated Symptoms: pain, hit head when she fell but not having any headache or neck pain Negative ROS: Confusion, diaphoresis, fever, headache, weakness (general or focal), change of vision,  neck pain, dysphagia, aphagia, chest pain, shortness of breath,  back pain, abdominal pains, nausea, vomiting, diarrhea, lower extremity swelling, rash.  Past Medical History  Diagnosis Date  . Sickle cell trait   . Pregnant   . Hypertension     Hx PIH with 2002 preg, no current problems  . Depression     PTSD (1997) major depression (2013) - no meds  . Migraine     otc med prn   Past Surgical History  Procedure Laterality Date  . Cesarean section      x 6  . Wisdom tooth extraction    . Cesarean section with bilateral tubal ligation Bilateral 03/02/2014    Procedure: REPEAT CESAREAN SECTION WITH BILATERAL TUBAL LIGATION;  Surgeon: Allie Bossier, MD;  Location: [facility];   Service: Obstetrics;  Laterality: Bilateral;   Family History  Problem Relation Age of Onset  . Hypertension Mother   . Hypertension Father   . Diabetes Father    Social History  Substance Use Topics  . Smoking status: Never Smoker   . Smokeless tobacco: Never Used  . Alcohol Use: No     Comment: ocassionaly before pregnancy   OB History    Gravida Para Term Preterm AB TAB SAB Ectopic Multiple Living   Review of Systems  10 Systems reviewed and are negative for acute change except as noted in the HPI.     Allergies  Fish allergy and Penicillins  Home Medications   Prior to Admission medications   Medication Sig Start Date End Date Taking? Authorizing Provider  diphenhydrAMINE (BENADRYL) 25 mg capsule Take 1 capsule (25 mg total) by mouth every 8 (eight) hours as needed. 08/17/15   Earley Favor, NP  ibuprofen (ADVIL,MOTRIN) 600 MG tablet Take 1 tablet (600 mg total) by mouth 3 (three) times daily. 08/17/15   Earley Favor, NP  metoCLOPramide (REGLAN) 10 MG tablet Take 1 tablet (10 mg total) by mouth every 6 (six) hours as needed for nausea. 08/17/15   Earley Favor, NP   BP 111/56 mmHg  Pulse 101  Temp(Src) 98.1 F (36.7 C) (Oral)  Resp 16  SpO2 100%  LMP 06/20/2015 Physical Exam  Constitutional:  She appears well-developed and well-nourished. No distress.  HENT:  Head: Normocephalic and atraumatic.  Eyes: Pupils are equal, round, and reactive to light.  Neck: Normal range of motion. Neck supple.  Cardiovascular: Normal rate and regular rhythm.   Pulmonary/Chest: Effort normal.  Abdominal: Soft.  Musculoskeletal:       Left foot: There is tenderness and swelling. There is normal range of motion, no bony tenderness, normal capillary refill, no crepitus, no deformity and no laceration.  Large 1.5 cm piece of wood stuck to bottom of the foot. NO associated drainage or cellulitis.  Neurological: She is alert.  Skin: Skin is warm and dry.  Nursing note and  vitals reviewed.   ED Course  FOREIGN BODY REMOVAL Date/Time: 08/28/2015 11:39 AM Performed by: Marlon Pel Authorized by: Marlon Pel Consent: Verbal consent obtained. Risks and benefits: risks, benefits and alternatives were discussed Consent given by: patient Patient understanding: patient states understanding of the procedure being performed Patient identity confirmed: verbally with patient and arm band Body area: skin Localization method: visualized Removal mechanism: forceps Dressing: antibiotic ointment Depth: subcutaneous Complexity: simple 1 objects recovered. Objects recovered: large splinter Post-procedure assessment: foreign body removed Patient tolerance: Patient tolerated the procedure well with no immediate complications   (including critical care time)  DIAGNOSTIC STUDIES:   COORDINATION OF CARE:    Labs Review Labs Reviewed - No data to display  Imaging Review Dg Ankle Complete Left  08/28/2015   CLINICAL DATA:  Acute left ankle pain after fall today. Initial encounter.  EXAM: LEFT ANKLE COMPLETE - 3+ VIEW  COMPARISON:  None.  FINDINGS: There is no evidence of fracture, dislocation, or joint effusion. There is no evidence of arthropathy or other focal bone abnormality. Soft tissues are unremarkable.  IMPRESSION: Normal left ankle.   Electronically Signed   By: Lupita Raider, M.D.   On: 08/28/2015 11:17   Dg Foot Complete Left  08/28/2015   CLINICAL DATA:  Lateral foot pain, fall this morning  EXAM: LEFT FOOT - COMPLETE 3+ VIEW  COMPARISON:  None.  FINDINGS: Three views of the left foot submitted. No acute fracture or subluxation. No radiopaque foreign body.  IMPRESSION: Negative.   Electronically Signed   By: Natasha Mead M.D.   On: 08/28/2015 11:13   I have personally reviewed and evaluated these images and lab results as part of my medical decision-making.   EKG Interpretation None      MDM   Final diagnoses:  Foreign body in foot, left,  initial encounter   Ankle and foot xrays are unremarkable. FB piece of wood removed from foot. Tetanus, abx and pain medication. Crutches and post op boot. Referral to Ortho  Medications  Tdap (BOOSTRIX) injection 0.5 mL (not administered)  cephALEXin (KEFLEX) capsule 250 mg (not administered)  ondansetron (ZOFRAN-ODT) disintegrating tablet 4 mg (not administered)  HYDROcodone-acetaminophen (NORCO/VICODIN) 5-325 MG per tablet 1 tablet (not administered)    34 y.o.[facility] evaluation in the Emergency Department is complete. It has been determined that no acute conditions requiring further emergency intervention are present at this time. The patient/guardian have been advised of the diagnosis and plan. We have discussed signs and symptoms that warrant return to the ED, such as changes or worsening in symptoms.  Vital signs are stable at discharge. Filed Vitals:   08/28/15 0955  BP: 111/56  Pulse: 101  Temp: 98.1 F (36.7 C)  Resp: 16    Patient/guardian has voiced understanding and agreed to follow-up with the  PCP or specialist.   I personally performed the services described in this documentation, which was scribed in my presence. The recorded information has been reviewed and is accurate.     Marlon Pel, PA-C 08/28/15 1141  Marlon Pel, PA-C 08/28/15 1142  Elwin Mocha, MD 08/28/15 1300

## 2016-01-01 ENCOUNTER — Emergency Department (HOSPITAL_COMMUNITY): Payer: Medicaid Other

## 2016-01-01 ENCOUNTER — Encounter (HOSPITAL_COMMUNITY): Payer: Self-pay

## 2016-01-01 ENCOUNTER — Emergency Department (HOSPITAL_COMMUNITY)
Admission: EM | Admit: 2016-01-01 | Discharge: 2016-01-01 | Disposition: A | Discharge status: Home / Self Care | Payer: Medicaid Other | Attending: Emergency Medicine | Admitting: Emergency Medicine

## 2016-01-01 DIAGNOSIS — Z880 Allergy status to penicillin: Secondary | ICD-10-CM | POA: Insufficient documentation

## 2016-01-01 DIAGNOSIS — Z8659 Personal history of other mental and behavioral disorders: Secondary | ICD-10-CM | POA: Insufficient documentation

## 2016-01-01 DIAGNOSIS — R079 Chest pain, unspecified: Secondary | ICD-10-CM | POA: Insufficient documentation

## 2016-01-01 DIAGNOSIS — R51 Headache: Secondary | ICD-10-CM | POA: Insufficient documentation

## 2016-01-01 DIAGNOSIS — I10 Essential (primary) hypertension: Secondary | ICD-10-CM | POA: Insufficient documentation

## 2016-01-01 DIAGNOSIS — Z862 Personal history of diseases of the blood and blood-forming organs and certain disorders involving the immune mechanism: Secondary | ICD-10-CM | POA: Insufficient documentation

## 2016-01-01 LAB — CBC
HEMATOCRIT: 36.5 % (ref 36.0–46.0)
Hemoglobin: 12.1 g/dL (ref 12.0–15.0)
MCH: 26.7 pg (ref 26.0–34.0)
MCHC: 33.2 g/dL (ref 30.0–36.0)
MCV: 80.6 fL (ref 78.0–100.0)
PLATELETS: 293 10*3/uL (ref 150–400)
RBC: 4.53 MIL/uL (ref 3.87–5.11)
RDW: 14 % (ref 11.5–15.5)
WBC: 6.6 10*3/uL (ref 4.0–10.5)

## 2016-01-01 LAB — BASIC METABOLIC PANEL
Anion gap: 9 (ref 5–15)
BUN: 9 mg/dL (ref 6–20)
CHLORIDE: 106 mmol/L (ref 101–111)
CO2: 26 mmol/L (ref 22–32)
CREATININE: 0.78 mg/dL (ref 0.44–1.00)
Calcium: 8.9 mg/dL (ref 8.9–10.3)
GFR calc non Af Amer: >60 mL/min (ref >60)
Glucose, Bld: 95 mg/dL (ref 65–99)
Potassium: 4.2 mmol/L (ref 3.5–5.1)
Sodium: 141 mmol/L (ref 135–145)

## 2016-01-01 LAB — I-STAT TROPONIN, ED: Troponin i, poc: 0 ng/mL (ref 0.00–0.08)

## 2016-01-01 MED ORDER — CYCLOBENZAPRINE HCL 10 MG PO TABS: 10.0000 mg | ORAL_TABLET | Freq: Three times a day (TID) | ORAL | Status: DC | PRN

## 2016-01-01 MED ORDER — IBUPROFEN 600 MG PO TABS: 600.0000 mg | ORAL_TABLET | Freq: Three times a day (TID) | ORAL | Status: DC | PRN

## 2016-01-01 MED ORDER — IBUPROFEN 400 MG PO TABS
600.0000 mg | ORAL_TABLET | Freq: Once | ORAL | Status: AC
  Administered 2016-01-01: 600 mg via ORAL
  Filled 2016-01-01: qty 1

## 2016-01-01 MED ORDER — OMEPRAZOLE 20 MG PO CPDR: 20.0000 mg | DELAYED_RELEASE_CAPSULE | Freq: Every day | ORAL | Status: DC

## 2016-01-01 MED ORDER — GI COCKTAIL ~~LOC~~
30.0000 mL | Freq: Once | ORAL | Status: DC
  Filled 2016-01-01: qty 30

## 2016-01-01 NOTE — Discharge Instructions (Signed)

## 2016-01-01 NOTE — ED Notes (Signed)
Patient here with migraine that started 0600 and CP x 4 days. Hx of migraines. photophobia

## 2016-01-02 NOTE — ED Provider Notes (Signed)
CSN: [id number]     Arrival date & time 01/01/16  1425 History   First MD Initiated Contact with Patient 01/01/16 1949     Chief Complaint  Patient presents with  . Headache  . Chest Pain     HPI Patient presents to the emergency department complaining of 4 days of left-sided chest pain that sharp in nature and worse with movement of her left arm and palpation of her left chest.  She also reports a history of high-grade headaches reports headache over the past 24 hours of mild photophobia.  Denies nausea vomiting.  No diarrhea.  No shortness of breath.  Denies productive cough.  No recent injury or trauma to her left chest.  No recent heavy lifting.  Symptoms are mild in severity.  Nothing worsens or improves her symptoms except as above   Past Medical History  Diagnosis Date  . Sickle cell trait (HCC)   . Pregnant   . Hypertension     Hx PIH with 2002 preg, no current problems  . Depression     PTSD (1997) major depression (2013) - no meds  . Migraine     otc med prn   Past Surgical History  Procedure Laterality Date  . Cesarean section      x 6  . Wisdom tooth extraction    . Cesarean section with bilateral tubal ligation Bilateral 03/02/2014    Procedure: REPEAT CESAREAN SECTION WITH BILATERAL TUBAL LIGATION;  Surgeon: Allie Bossier, MD;  Location: [facility];  Service: Obstetrics;  Laterality: Bilateral;   Family History  Problem Relation Age of Onset  . Hypertension Mother   . Hypertension Father   . Diabetes Father    Social History  Substance Use Topics  . Smoking status: Never Smoker   . Smokeless tobacco: Never Used  . Alcohol Use: No     Comment: ocassionaly before pregnancy   OB History    Gravida Para Term Preterm AB TAB SAB Ectopic Multiple Living   Review of Systems  All other systems reviewed and are negative.     Allergies  Fish allergy and Penicillins  Home Medications   Prior to Admission medications   Medication Sig Start  Date End Date Taking? Authorizing Provider  cyclobenzaprine (FLEXERIL) 10 MG tablet Take 1 tablet (10 mg total) by mouth 3 (three) times daily as needed for muscle spasms. 01/01/16   Azalia Bilis, MD  ibuprofen (ADVIL,MOTRIN) 600 MG tablet Take 1 tablet (600 mg total) by mouth every 8 (eight) hours as needed. 01/01/16   Azalia Bilis, MD  omeprazole (PRILOSEC) 20 MG capsule Take 1 capsule (20 mg total) by mouth daily. 01/01/16   Azalia Bilis, MD   BP 105/79 mmHg  Pulse 78  Temp(Src) 98.6 F (37 C) (Oral)  Resp 19  Ht  (1.626 m)  Wt 170 lb (77.111 kg)  BMI 29.17 kg/m2  SpO2 100%  LMP 11/13/2015 Physical Exam  Constitutional: She is oriented to person, place, and time. She appears well-developed and well-nourished. No distress.  HENT:  Head: Normocephalic and atraumatic.  Eyes: EOM are normal.  Neck: Normal range of motion.  Cardiovascular: Normal rate, regular rhythm and normal heart sounds.   Pulmonary/Chest: Effort normal and breath sounds normal.  Tenderness left anterior chest wall  Abdominal: Soft. She exhibits no distension. There is no tenderness.  Musculoskeletal: Normal range of motion.  Neurological: She is  alert and oriented to person, place, and time.  Skin: Skin is warm and dry.  Psychiatric: She has a normal mood and affect. Judgment normal.  Nursing note and vitals reviewed.   ED Course  Procedures (including critical care time) Labs Review Labs Reviewed  BASIC METABOLIC PANEL  CBC  I-STAT TROPOININ, ED    Imaging Review Dg Chest 2 View  01/01/2016  CLINICAL DATA:  35 year old female with chest pain, headache and shortness of breath for the past 4 days. EXAM: CHEST  2 VIEW COMPARISON:  Chest x-ray 08/16/2015. FINDINGS: Lung volumes are normal. No consolidative airspace disease. No pleural effusions. No pneumothorax. No pulmonary nodule or mass noted. Pulmonary vasculature and the cardiomediastinal silhouette are within normal limits. IMPRESSION: No  radiographic evidence of acute cardiopulmonary disease. Electronically Signed   By: Trudie Reed M.D.   On: 01/01/2016 20:31   I have personally reviewed and evaluated these images and lab results as part of my medical decision-making.   EKG Interpretation   Date/Time:  Thursday January 01 2016 14:42:27 EST Ventricular Rate:  82 PR Interval:  114 QRS Duration: 88 QT Interval:  380 QTC Calculation: 443 R Axis:   21 Text Interpretation:  Sinus rhythm with occasional Premature ventricular  complexes Minimal voltage criteria for LVH, may be normal variant  Nonspecific T wave abnormality Abnormal ECG No significant change was  found Confirmed by Maleigha Colvard  MD, Carsen Machi ([id number]) on 01/01/2016 8:03:50 PM      MDM   Final diagnoses:  Chest pain, unspecified chest pain type    Chest wall pain.  Normal neurologic exam.  No focal weakness of her arms or legs.  Likely migraine headache.  Symptoms improving in the ER.  No indication for imaging.  Doubt ACS.  Doubt PE.    Azalia Bilis, MD 01/02/16 [facility]

## 2016-03-29 ENCOUNTER — Ambulatory Visit (INDEPENDENT_AMBULATORY_CARE_PROVIDER_SITE_OTHER): Payer: Self-pay

## 2016-03-29 ENCOUNTER — Encounter (HOSPITAL_COMMUNITY): Payer: Self-pay | Admitting: Nurse Practitioner

## 2016-03-29 ENCOUNTER — Ambulatory Visit (HOSPITAL_COMMUNITY)
Admission: EM | Admit: 2016-03-29 | Discharge: 2016-03-29 | Disposition: A | Discharge status: Home / Self Care | Payer: Self-pay | Attending: Family Medicine | Admitting: Family Medicine

## 2016-03-29 DIAGNOSIS — M25511 Pain in right shoulder: Secondary | ICD-10-CM

## 2016-03-29 NOTE — Discharge Instructions (Signed)
Cryotherapy °Cryotherapy is when you put ice on your injury. Ice helps lessen pain and puffiness (swelling) after an injury. Ice works the best when you start using it in the first 24 to 48 hours after an injury. °HOME CARE °· Put a dry or damp towel between the ice pack and your skin. °· You may press gently on the ice pack. °· Leave the ice on for no more than 10 to 20 minutes at a time. °· Check your skin after 5 minutes to make sure your skin is okay. °· Rest at least 20 minutes between ice pack uses. °· Stop using ice when your skin loses feeling (numbness). °· Do not use ice on someone who cannot tell you when it hurts. This includes small children and people with memory problems (dementia). °GET HELP RIGHT AWAY IF: °· You have white spots on your skin. °· Your skin turns blue or pale. °· Your skin feels waxy or hard. °· Your puffiness gets worse. °MAKE SURE YOU:  °· Understand these instructions. °· Will watch your condition. °· Will get help right away if you are not doing well or get worse. °  °This information is not intended to replace advice given to you by your health care provider. Make sure you discuss any questions you have with your health care provider. °  °Document Released: 05/17/2008 Document Revised: 02/21/2012 Document Reviewed: 07/22/2011 °Elsevier Interactive Patient Education ©2016 [employer] ° ° °Shoulder Pain  ° ° °The shoulder is the joint that connects your arm to your body. Muscles and band-like tissues that connect bones to muscles (tendons) hold the joint together. Shoulder pain is felt if an injury or medical problem affects one or more parts of the shoulder.  °HOME CARE  °Put ice on the sore area.  °Put ice in a plastic bag.  °Place a towel between your skin and the bag.  °Leave the ice on for 15-20 minutes, 03-04 times a day for the first 2 days. °Stop using cold packs if they do not help with the pain.  °If you were given something to keep your shoulder from moving (sling;  shoulder immobilizer), wear it as told. Only take it off to shower or bathe.  °Move your arm as little as possible, but keep your hand moving to prevent puffiness (swelling).  °Squeeze a soft ball or foam pad as much as possible to help prevent swelling.  °Take medicine as told by your doctor. °GET HELP IF:  °You have progressing new pain in your arm, hand, or fingers.  °Your hand or fingers get cold.  °Your medicine does not help lessen your pain. °GET HELP RIGHT AWAY IF:  °Your arm, hand, or fingers are numb or tingling.  °Your arm, hand, or fingers are puffy (swollen), painful, or turn white or blue. °MAKE SURE YOU:  °Understand these instructions.  °Will watch your condition.  °Will get help right away if you are not doing well or get worse. °This information is not intended to replace advice given to you by your health care provider. Make sure you discuss any questions you have with your health care provider.  °Document Released: 05/17/2008 Document Revised: 12/20/2014 Document Reviewed: 03/24/2015  °Elsevier Interactive Patient Education ©2016 [employer]  ° °

## 2016-03-29 NOTE — ED Notes (Signed)
Pt c/o migraine headache since this morning. She was on a ladder at home and became dizzy and lost her balance falling about 10 steps onto her floor. She reports possible LOC at this time. Since the fall, her dizziness has resolved but she continues to have a headache. She tried her maxalt with no relief of the pain. She also reports right upper arm pain from elbow to shoulder since the fall. Cms intact. movement increases the pain

## 2016-03-29 NOTE — ED Provider Notes (Signed)
CSN: [id number]     Arrival date & time 03/29/16  1303 History   First MD Initiated Contact with Patient 03/29/16 1453     Chief Complaint  Patient presents with  . Fall   (Consider location/radiation/quality/duration/timing/severity/associated sxs/prior Treatment) HPI Pt states that she fell from ladder, causing LOC that she is not sure how long.  She states taht she had a headahce prior to getting on the ladder. States that LR was in disary when she awoke form the fall. She states that her 35 year old had pulled toys out. States she had a bit of dizziness, but those symptoms are gone at this time. 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  Past Medical History  Diagnosis Date  . Sickle cell trait (HCC)   . Pregnant   . Hypertension     Hx PIH with 2002 preg, no current problems  . Depression     PTSD (1997) major depression (2013) - no meds  . Migraine     otc med prn   Past Surgical History  Procedure Laterality Date  . Cesarean section      x 6  . Wisdom tooth extraction    . Cesarean section with bilateral tubal ligation Bilateral 03/02/2014    Procedure: REPEAT CESAREAN SECTION WITH BILATERAL TUBAL LIGATION;  Surgeon: Allie Bossier, MD;  Location: [facility];  Service: Obstetrics;  Laterality: Bilateral;  . Tubal ligation     Family History  Problem Relation Age of Onset  . Hypertension Mother   . Hypertension Father   . Diabetes Father    Social History  Substance Use Topics  . Smoking status: Never Smoker   . Smokeless tobacco: Never Used  . Alcohol Use: No     Comment: ocassionaly before pregnancy   OB History    Gravida Para Term Preterm AB TAB SAB Ectopic Multiple Living   Review of Systems Fell from ladder injury to right shoulder.  Allergies  Fish allergy and Penicillins  Home Medications   Prior to Admission medications   Medication Sig Start Date End Date Taking? Authorizing Provider  rizatriptan (MAXALT) 5 MG tablet Take 10 mg by mouth as needed for  migraine. May repeat in 2 hours if needed   Yes Historical Provider, MD  cyclobenzaprine (FLEXERIL) 10 MG tablet Take 1 tablet (10 mg total) by mouth 3 (three) times daily as needed for muscle spasms. 01/01/16   Azalia Bilis, MD  ibuprofen (ADVIL,MOTRIN) 600 MG tablet Take 1 tablet (600 mg total) by mouth every 8 (eight) hours as needed. 01/01/16   Azalia Bilis, MD  omeprazole (PRILOSEC) 20 MG capsule Take 1 capsule (20 mg total) by mouth daily. 01/01/16   Azalia Bilis, MD   Meds Ordered and Administered this Visit  Medications - No data to display  BP 112/70 mmHg  Pulse 87  Temp(Src) 98.9 F (37.2 C) (Oral)  Resp 16  SpO2 100%  LMP 02/07/2016 No data found.   Physical Exam NURSES NOTES AND VITAL SIGNS REVIEWED. CONSTITUTIONAL: Well developed, well nourished, no acute distress HEENT: normocephalic, atraumatic EYES: Conjunctiva normal NECK:normal ROM, supple, no adenopathy PULMONARY:No respiratory distress, normal effort MUSCULOSKELETAL: Normal ROM of all extremities, Right shoulder is tender to palpation, but ROM is good. Pt resists movement of elbow on examination. But no palpable or visible deformity.  SKIN: warm and dry without rash PSYCHIATRIC: Mood and affect, behavior are normal  ED Course  Procedures (including critical care time)  Labs Review Labs Reviewed - No data to display  Imaging Review No results found.   Visual Acuity Review  Right Eye Distance:   Left Eye Distance:   Bilateral Distance:    Right Eye Near:   Left Eye Near:    Bilateral Near:     I HAVE REVIEWED AND DISCUSSED RESULTS OF THE X-RAYS    MDM   1. Shoulder pain, acute, right     Patient is reassured that there are no issues that require transfer to higher level of care at this time or additional tests. Patient is advised to continue home symptomatic treatment. Patient is advised that if there are new or worsening symptoms to attend the emergency department, contact primary care  provider, or return to UC. Instructions of care provided discharged home in stable condition.    THIS NOTE WAS GENERATED USING A VOICE RECOGNITION SOFTWARE PROGRAM. ALL REASONABLE EFFORTS  WERE MADE TO PROOFREAD THIS DOCUMENT FOR ACCURACY.  I have verbally reviewed the discharge instructions with the patient. A printed AVS was given to the patient.  All questions were answered prior to discharge.      Tharon AquasFrank C Jarryn Altland, PA 03/29/16 1924

## 2016-04-22 ENCOUNTER — Ambulatory Visit (HOSPITAL_COMMUNITY): Admission: EM | Admit: 2016-04-22 | Discharge: 2016-04-22 | Discharge status: Absent without leave | Payer: Self-pay

## 2016-04-24 ENCOUNTER — Encounter (HOSPITAL_COMMUNITY): Payer: Self-pay | Admitting: *Deleted

## 2016-04-24 ENCOUNTER — Emergency Department (HOSPITAL_COMMUNITY)
Admission: EM | Admit: 2016-04-24 | Discharge: 2016-04-24 | Disposition: A | Discharge status: Home / Self Care | Payer: Self-pay | Attending: Emergency Medicine | Admitting: Emergency Medicine

## 2016-04-24 DIAGNOSIS — R05 Cough: Secondary | ICD-10-CM | POA: Insufficient documentation

## 2016-04-24 DIAGNOSIS — R079 Chest pain, unspecified: Secondary | ICD-10-CM | POA: Insufficient documentation

## 2016-04-24 DIAGNOSIS — I10 Essential (primary) hypertension: Secondary | ICD-10-CM | POA: Insufficient documentation

## 2016-04-24 LAB — CBC
HCT: 38.1 % (ref 36.0–46.0)
Hemoglobin: 12.4 g/dL (ref 12.0–15.0)
MCH: 26.2 pg (ref 26.0–34.0)
MCHC: 32.5 g/dL (ref 30.0–36.0)
MCV: 80.4 fL (ref 78.0–100.0)
PLATELETS: 320 10*3/uL (ref 150–400)
RBC: 4.74 MIL/uL (ref 3.87–5.11)
RDW: 13.7 % (ref 11.5–15.5)
WBC: 8.3 10*3/uL (ref 4.0–10.5)

## 2016-04-24 LAB — BASIC METABOLIC PANEL
Anion gap: 10 (ref 5–15)
BUN: 12 mg/dL (ref 6–20)
CALCIUM: 9.1 mg/dL (ref 8.9–10.3)
CO2: 24 mmol/L (ref 22–32)
CREATININE: 0.92 mg/dL (ref 0.44–1.00)
Chloride: 105 mmol/L (ref 101–111)
GFR calc Af Amer: >60 mL/min (ref >60)
GLUCOSE: 111 mg/dL — AB (ref 65–99)
Potassium: 3.8 mmol/L (ref 3.5–5.1)
Sodium: 139 mmol/L (ref 135–145)

## 2016-04-24 LAB — TROPONIN I

## 2016-04-24 NOTE — ED Notes (Signed)
The pt is c/o chest pain with a cough non-productive for 4 days lmp the first of the month

## 2016-04-24 NOTE — ED Notes (Signed)
Called Pt x's 4. No Answer. Called X-Ray also but they stated they did not have the patient either.

## 2016-05-25 ENCOUNTER — Encounter (HOSPITAL_COMMUNITY): Payer: Self-pay | Admitting: Nurse Practitioner

## 2016-05-25 ENCOUNTER — Emergency Department (HOSPITAL_COMMUNITY)
Admission: EM | Admit: 2016-05-25 | Discharge: 2016-05-26 | Disposition: A | Discharge status: Home / Self Care | Payer: Self-pay | Attending: Emergency Medicine | Admitting: Emergency Medicine

## 2016-05-25 ENCOUNTER — Emergency Department (HOSPITAL_COMMUNITY): Payer: Self-pay

## 2016-05-25 DIAGNOSIS — H6122 Impacted cerumen, left ear: Secondary | ICD-10-CM | POA: Insufficient documentation

## 2016-05-25 DIAGNOSIS — I10 Essential (primary) hypertension: Secondary | ICD-10-CM | POA: Insufficient documentation

## 2016-05-25 DIAGNOSIS — N760 Acute vaginitis: Secondary | ICD-10-CM | POA: Insufficient documentation

## 2016-05-25 DIAGNOSIS — R1030 Lower abdominal pain, unspecified: Secondary | ICD-10-CM

## 2016-05-25 DIAGNOSIS — H9202 Otalgia, left ear: Secondary | ICD-10-CM

## 2016-05-25 DIAGNOSIS — R102 Pelvic and perineal pain: Secondary | ICD-10-CM

## 2016-05-25 DIAGNOSIS — B9689 Other specified bacterial agents as the cause of diseases classified elsewhere: Secondary | ICD-10-CM | POA: Insufficient documentation

## 2016-05-25 LAB — URINALYSIS, ROUTINE W REFLEX MICROSCOPIC
Bilirubin Urine: NEGATIVE
GLUCOSE, UA: NEGATIVE mg/dL
Hgb urine dipstick: NEGATIVE
KETONES UR: NEGATIVE mg/dL
LEUKOCYTES UA: NEGATIVE
Nitrite: NEGATIVE
PROTEIN: NEGATIVE mg/dL
Specific Gravity, Urine: 1.017 (ref 1.005–1.030)
pH: 5.5 (ref 5.0–8.0)

## 2016-05-25 LAB — WET PREP, GENITAL
Sperm: NONE SEEN
TRICH WET PREP: NONE SEEN
YEAST WET PREP: NONE SEEN

## 2016-05-25 LAB — BASIC METABOLIC PANEL
ANION GAP: 5 (ref 5–15)
BUN: 8 mg/dL (ref 6–20)
CALCIUM: 8.8 mg/dL — AB (ref 8.9–10.3)
CO2: 25 mmol/L (ref 22–32)
Chloride: 107 mmol/L (ref 101–111)
Creatinine, Ser: 0.73 mg/dL (ref 0.44–1.00)
Glucose, Bld: 90 mg/dL (ref 65–99)
POTASSIUM: 3.8 mmol/L (ref 3.5–5.1)
Sodium: 137 mmol/L (ref 135–145)

## 2016-05-25 LAB — CBC
HCT: 38.1 % (ref 36.0–46.0)
Hemoglobin: 12.3 g/dL (ref 12.0–15.0)
MCH: 25.1 pg — ABNORMAL LOW (ref 26.0–34.0)
MCHC: 32.3 g/dL (ref 30.0–36.0)
MCV: 77.8 fL — ABNORMAL LOW (ref 78.0–100.0)
PLATELETS: 303 10*3/uL (ref 150–400)
RBC: 4.9 MIL/uL (ref 3.87–5.11)
RDW: 14 % (ref 11.5–15.5)
WBC: 7.8 10*3/uL (ref 4.0–10.5)

## 2016-05-25 LAB — I-STAT BETA HCG BLOOD, ED (MC, WL, AP ONLY): I-stat hCG, quantitative: <5 m[IU]/mL (ref <5)

## 2016-05-25 LAB — I-STAT TROPONIN, ED: TROPONIN I, POC: 0 ng/mL (ref 0.00–0.08)

## 2016-05-25 MED ORDER — CEFTRIAXONE SODIUM 250 MG IJ SOLR
250.0000 mg | Freq: Once | INTRAMUSCULAR | Status: AC
  Administered 2016-05-25: 250 mg via INTRAMUSCULAR
  Filled 2016-05-25: qty 250

## 2016-05-25 MED ORDER — ONDANSETRON 4 MG PO TBDP
4.0000 mg | ORAL_TABLET | Freq: Three times a day (TID) | ORAL | Status: DC | PRN
Start: 2016-05-25 — End: 2016-07-20

## 2016-05-25 MED ORDER — IBUPROFEN 800 MG PO TABS: 800.0000 mg | ORAL_TABLET | Freq: Three times a day (TID) | ORAL | Status: DC

## 2016-05-25 MED ORDER — LIDOCAINE HCL (PF) 1 % IJ SOLN
INTRAMUSCULAR | Status: AC
  Filled 2016-05-25: qty 5

## 2016-05-25 MED ORDER — METRONIDAZOLE 500 MG PO TABS: 500.0000 mg | ORAL_TABLET | Freq: Two times a day (BID) | ORAL | Status: DC

## 2016-05-25 MED ORDER — HYDROCODONE-ACETAMINOPHEN 5-325 MG PO TABS: 1.0000 | ORAL_TABLET | Freq: Four times a day (QID) | ORAL | Status: DC | PRN

## 2016-05-25 MED ORDER — LIDOCAINE HCL (PF) 1 % IJ SOLN
2.0000 mL | Freq: Once | INTRAMUSCULAR | Status: AC
  Administered 2016-05-25: 1.9 mL

## 2016-05-25 MED ORDER — CARBAMIDE PEROXIDE 6.5 % OT SOLN
5.0000 [drp] | Freq: Once | OTIC | Status: AC
Start: 2016-05-25 — End: 2016-05-25
  Administered 2016-05-25: 5 [drp] via OTIC
  Filled 2016-05-25: qty 15

## 2016-05-25 MED ORDER — AZITHROMYCIN 250 MG PO TABS
1000.0000 mg | ORAL_TABLET | Freq: Once | ORAL | Status: AC
  Administered 2016-05-25: 1000 mg via ORAL
  Filled 2016-05-25: qty 4

## 2016-05-25 NOTE — ED Provider Notes (Signed)
CSN: [id number]     Arrival date & time 05/25/16  1642 History   First MD Initiated Contact with Patient 05/25/16 2008     Chief Complaint  Patient presents with  . Pelvic Pain  . Otalgia     (Consider location/radiation/quality/duration/timing/severity/associated sxs/prior Treatment) HPI   Patient is a 35 year old female who presents to the emergency room for multiple complaints including 4 days of left otalgia with decreased hearing, 3 days of intermittent sharp, sudden onset pelvic pain with some radiation around her right side, and also chest pain which she believes is related to stress. Most concerning to the patient is a sharp abdominal pain which is felt in her lower abdomen. She states that the pain occurs suddenly usually while she is walking or moving, is sharp, severe, lasted roughly 5 minutes at a time and then resolves spontaneously. When the pain occurs it causes her to "stop in [her] tracks" it also "takes [her] breath away" and is associated with mild nausea and dizziness. She denies any diaphoresis, vomiting or near syncope. The pain has occurred all she was lying down and she had some relief when laying on her left side but otherwise she cannot identify any alleviating or aggravating factors.  She denies dysuria, hematuria, urinary frequency or urgency. She denies any vaginal symptoms including no discharge, no dyspareunia, no general rash or lesions.  She did tubal ligation 3 years ago.  She denies diarrhea, states last normal bowel movement was yesterday.  No fevers, sweats, chills.  She also briefly mentions sporadic chest pain which is felt across the top of her anterior chest, associated with rapid heart rate and anxiety. She denies any shortness of breath, cough, wheeze, palpitations, lower extremity edema.  Patient denies any other past medical history. She has an allergy to penicillin.     Past Medical History  Diagnosis Date  . Sickle cell trait (HCC)   . Pregnant   .  Hypertension     Hx PIH with 2002 preg, no current problems  . Depression     PTSD (1997) major depression (2013) - no meds  . Migraine     otc med prn   Past Surgical History  Procedure Laterality Date  . Cesarean section      x 6  . Wisdom tooth extraction    . Cesarean section with bilateral tubal ligation Bilateral 03/02/2014    Procedure: REPEAT CESAREAN SECTION WITH BILATERAL TUBAL LIGATION;  Surgeon: Allie Bossier, MD;  Location: [facility];  Service: Obstetrics;  Laterality: Bilateral;  . Tubal ligation     Family History  Problem Relation Age of Onset  . Hypertension Mother   . Hypertension Father   . Diabetes Father    Social History  Substance Use Topics  . Smoking status: Never Smoker   . Smokeless tobacco: Never Used  . Alcohol Use: No     Comment: ocassionaly before pregnancy   OB History    Gravida Para Term Preterm AB TAB SAB Ectopic Multiple Living   7 6 6  1  1   6      Review of Systems  All other systems reviewed and are negative.     Allergies  Fish allergy and Penicillins  Home Medications   Prior to Admission medications   Medication Sig Start Date End Date Taking? Authorizing Provider  rizatriptan (MAXALT) 5 MG tablet Take 10 mg by mouth as needed for migraine. May repeat in 2 hours if needed   Yes  Historical Provider, MD  HYDROcodone-acetaminophen (NORCO/VICODIN) 5-325 MG tablet Take 1 tablet by mouth every 6 (six) hours as needed for severe pain. 05/25/16   Danelle Berry, PA-C  ibuprofen (ADVIL,MOTRIN) 800 MG tablet Take 1 tablet (800 mg total) by mouth 3 (three) times daily. 05/25/16   Danelle Berry, PA-C  metroNIDAZOLE (FLAGYL) 500 MG tablet Take 1 tablet (500 mg total) by mouth 2 (two) times daily. 05/25/16   Danelle Berry, PA-C  ondansetron (ZOFRAN ODT) 4 MG disintegrating tablet Take 1 tablet (4 mg total) by mouth every 8 (eight) hours as needed for nausea. 05/25/16   Danelle Berry, PA-C   BP 102/66 mmHg  Pulse 73  Temp(Src) 98.2 F (36.8 C) (Oral)   Resp 17  SpO2 99%  LMP 03/25/2016 Physical Exam  Constitutional: She is oriented to person, place, and time. She appears well-developed and well-nourished. No distress.  HENT:  Head: Normocephalic and atraumatic.  Nose: Nose normal.  Mouth/Throat: Oropharynx is clear and moist. No oropharyngeal exudate.  Eyes: Conjunctivae and EOM are normal. Pupils are equal, round, and reactive to light. Right eye exhibits no discharge. Left eye exhibits no discharge. No scleral icterus.  Neck: Normal range of motion. No JVD present. No tracheal deviation present. No thyromegaly present.  Cardiovascular: Normal rate, regular rhythm, normal heart sounds and intact distal pulses.  Exam reveals no gallop and no friction rub.   No murmur heard. Pulmonary/Chest: Effort normal and breath sounds normal. No respiratory distress. She has no wheezes. She has no rales. She exhibits no tenderness.  Abdominal: Soft. Bowel sounds are normal. She exhibits no distension and no mass. There is tenderness. There is no rebound and no guarding.  Mild suprapubic tenderness to palpation without guarding, no rebound tenderness, right CVA tenderness, abdomen obese, soft, nondistended, normal bowel sounds 4  Musculoskeletal: Normal range of motion. She exhibits no edema or tenderness.  Lymphadenopathy:    She has no cervical adenopathy.  Neurological: She is alert and oriented to person, place, and time. She has normal reflexes. No cranial nerve deficit. She exhibits normal muscle tone. Coordination normal.  Skin: Skin is warm and dry. No rash noted. She is not diaphoretic. No erythema. No pallor.  Psychiatric: She has a normal mood and affect. Her behavior is normal. Judgment and thought content normal.  Nursing note and vitals reviewed.   ED Course  Procedures (including critical care time) Labs Review Labs Reviewed  WET PREP, GENITAL - Abnormal; Notable for the following:    Clue Cells Wet Prep HPF POC PRESENT (*)    WBC,  Wet Prep HPF POC FEW (*)    All other components within normal limits  CBC - Abnormal; Notable for the following:    MCV 77.8 (*)    MCH 25.1 (*)    All other components within normal limits  BASIC METABOLIC PANEL - Abnormal; Notable for the following:    Calcium 8.8 (*)    All other components within normal limits  URINALYSIS, ROUTINE W REFLEX MICROSCOPIC (NOT AT [facility]) - Abnormal; Notable for the following:    APPearance CLOUDY (*)    All other components within normal limits  I-STAT BETA HCG BLOOD, ED ([facility], [facility], [facility] ONLY)  I-STAT TROPOININ, ED  GC/CHLAMYDIA PROBE AMP ([place]) NOT AT [facility]    Imaging Review US Pelvis Complete  05/25/2016  CLINICAL DATA:  Generalized pelvic pain. Patient refused transvaginal imaging. EXAM: TRANSABDOMINAL ULTRASOUND OF PELVIS DOPPLER ULTRASOUND OF OVARIES TECHNIQUE: Transabdominal ultrasound examination of the  pelvis was performed including evaluation of the uterus, ovaries, adnexal regions, and pelvic cul-de-sac. Color and duplex Doppler ultrasound was utilized to evaluate blood flow to the ovaries. COMPARISON:  None. FINDINGS: Uterus Measurements: 10.5 x 3.6 x 7 cm. Uterus is mildly retroflexed. No fibroids or other mass visualized. Endometrium Thickness: 4.1 mm. No focal abnormality visualized. Right ovary Measurements: 2.7 x 1.8 x 2.3 cm. Normal appearance/no adnexal mass. Left ovary Measurements: 2.7 x 3.2 x 2.3 cm. Normal appearance/no adnexal mass. Pulsed Doppler evaluation demonstrates normal low-resistance arterial and venous waveforms in both ovaries. Flow is demonstrated within both ovaries on color flow Doppler imaging. No free pelvic fluid. IMPRESSION: Normal sonographic appearance of the uterus and ovaries. Flow is demonstrated to both ovaries suggesting no evidence of ovarian torsion. Electronically Signed   By: Burman Nieves M.D.   On: 05/25/2016 23:15   [place] Art/ven Flow Abd Pelv Doppler  05/25/2016  CLINICAL DATA:  Generalized pelvic pain.  Patient refused transvaginal imaging. EXAM: TRANSABDOMINAL ULTRASOUND OF PELVIS DOPPLER ULTRASOUND OF OVARIES TECHNIQUE: Transabdominal ultrasound examination of the pelvis was performed including evaluation of the uterus, ovaries, adnexal regions, and pelvic cul-de-sac. Color and duplex Doppler ultrasound was utilized to evaluate blood flow to the ovaries. COMPARISON:  None. FINDINGS: Uterus Measurements: 10.5 x 3.6 x 7 cm. Uterus is mildly retroflexed. No fibroids or other mass visualized. Endometrium Thickness: 4.1 mm. No focal abnormality visualized. Right ovary Measurements: 2.7 x 1.8 x 2.3 cm. Normal appearance/no adnexal mass. Left ovary Measurements: 2.7 x 3.2 x 2.3 cm. Normal appearance/no adnexal mass. Pulsed Doppler evaluation demonstrates normal low-resistance arterial and venous waveforms in both ovaries. Flow is demonstrated within both ovaries on color flow Doppler imaging. No free pelvic fluid. IMPRESSION: Normal sonographic appearance of the uterus and ovaries. Flow is demonstrated to both ovaries suggesting no evidence of ovarian torsion. Electronically Signed   By: Burman Nieves M.D.   On: 05/25/2016 23:15   I have personally reviewed and evaluated these images and lab results as part of my medical decision-making.   EKG Interpretation   Date/Time:  Tuesday May 25 2016 16:49:28 EDT Ventricular Rate:  84 PR Interval:  116 QRS Duration: 84 QT Interval:  368 QTC Calculation: 434 R Axis:   18 Text Interpretation:  Normal sinus rhythm Minimal voltage criteria for  LVH, may be normal variant Borderline ECG Confirmed by Lincoln Brigham [phone])  on 05/25/2016 8:50:41 PM      MDM   35 year old patient with multiple complaints.  Suprapubic abdominal pain on exam and complaints of low abdominal pain with some radiation around the right side and flank.  Prior to the time of my evaluation basic labs were obtained however there was no urinalysis.  Patient may have UTI, also may have kidney  stone or ovarian pathology.  Will obtain urine.  Patient refuses pelvic exam but I encouraged them in all exam to evaluate for tenderness, samples for STD testing, which the patient will allow.  We have discussed possible ultrasound if she has adnexal tenderness, which would be to rule out ovarian torsion, which is possible, but not likely.  Pt denies STD's or risk of STD's.  No urinary complaints.  No GI complaints.  Pt is well appearing, with normal vital signs.  Pelvic ultrasound normal, unable to evaluate CMT with bimanual exam, vaginal swabs obtained results of pertinent for clue cells and WBC's.  UA negative.  Left ear consistent with cerumen impaction, will give debrox drops, pt has normal hearing bilaterally,  no edema or erythema, can treat outpt with drops provided.  Patient was treated for STDs, with Rocephin and azithromycin. In the chart she has documented a penicillin allergy, I called the ED pharmacist, spoke with Tammy SoursGreg and reviewed the chart and medications with him. She has limitation of being able to tolerate amoxicillin in the past, Tammy SoursGreg advised that if she tolerated amoxicillin she should be fine with Rocephin and Rocephin shot was administered, she was observed for 20- 30 minutes afterwards without any reaction.  She was given Flagyl to cover for BV.  Return precautions reviewed.  Patient's chest pain is likely anxiety, EKG is normal sinus rhythm, and negative troponin, no active chest pain currently and non-concerning for ACS.  Patient was discharged home in good condition with stable vital signs. Filed Vitals:   05/25/16 2130 05/25/16 2145 05/26/16 0014 05/26/16 0015  BP: 103/72 96/57  102/66  Pulse: 81 80  73  Temp:   98.2 F (36.8 C)   TempSrc:   Oral   Resp:      SpO2: 99% 98%  99%     Final diagnoses:  Lower abdominal pain  Otalgia, left  Cerumen impaction, left  BV (bacterial vaginosis)        Danelle BerryLeisa Kalie Cabral, PA-C 05/26/16 0037  Tilden FossaElizabeth Rees, MD 05/26/16  [id number]

## 2016-05-25 NOTE — Discharge Instructions (Signed)
Abdominal Pain, Adult Many things can cause abdominal pain. Usually, abdominal pain is not caused by a disease and will improve without treatment. It can often be observed and treated at home. Your health care provider will do a physical exam and possibly order blood tests and X-rays to help determine the seriousness of your pain. However, in many cases, more time must pass before a clear cause of the pain can be found. Before that point, your health care provider may not know if you need more testing or further treatment. HOME CARE INSTRUCTIONS Monitor your abdominal pain for any changes. The following actions may help to alleviate any discomfort you are experiencing:  Only take over-the-counter or prescription medicines as directed by your health care provider.  Do not take laxatives unless directed to do so by your health care provider.  Try a clear liquid diet (broth, tea, or water) as directed by your health care provider. Slowly move to a bland diet as tolerated. SEEK MEDICAL CARE IF:  You have unexplained abdominal pain.  You have abdominal pain associated with nausea or diarrhea.  You have pain when you urinate or have a bowel movement.  You experience abdominal pain that wakes you in the night.  You have abdominal pain that is worsened or improved by eating food.  You have abdominal pain that is worsened with eating fatty foods.  You have a fever. SEEK IMMEDIATE MEDICAL CARE IF:  Your pain does not go away within 2 hours.  You keep throwing up (vomiting).  Your pain is felt only in portions of the abdomen, such as the right side or the left lower portion of the abdomen.  You pass bloody or black tarry stools. MAKE SURE YOU:  Understand these instructions.  Will watch your condition.  Will get help right away if you are not doing well or get worse.   This information is not intended to replace advice given to you by your health care provider. Make sure you discuss  any questions you have with your health care provider.   Document Released: 09/08/2005 Document Revised: 08/20/2015 Document Reviewed: 08/08/2013 Elsevier Interactive Patient Education 2016 Elsevier Inc.  Bacterial Vaginosis Bacterial vaginosis is a vaginal infection that occurs when the normal balance of bacteria in the vagina is disrupted. It results from an overgrowth of certain bacteria. This is the most common vaginal infection in women of childbearing age. Treatment is important to prevent complications, especially in pregnant women, as it can cause a premature delivery. CAUSES  Bacterial vaginosis is caused by an increase in harmful bacteria that are normally present in smaller amounts in the vagina. Several different kinds of bacteria can cause bacterial vaginosis. However, the reason that the condition develops is not fully understood. RISK FACTORS Certain activities or behaviors can put you at an increased risk of developing bacterial vaginosis, including:  Having a new sex partner or multiple sex partners.  Douching.  Using an intrauterine device (IUD) for contraception. Women do not get bacterial vaginosis from toilet seats, bedding, swimming pools, or contact with objects around them. SIGNS AND SYMPTOMS  Some women with bacterial vaginosis have no signs or symptoms. Common symptoms include:  Grey vaginal discharge.  A fishlike odor with discharge, especially after sexual intercourse.  Itching or burning of the vagina and vulva.  Burning or pain with urination. DIAGNOSIS  Your health care provider will take a medical history and examine the vagina for signs of bacterial vaginosis. A sample of vaginal fluid may  be taken. Your health care provider will look at this sample under a microscope to check for bacteria and abnormal cells. A vaginal pH test may also be done.  TREATMENT  Bacterial vaginosis may be treated with antibiotic medicines. These may be given in the form of a  pill or a vaginal cream. A second round of antibiotics may be prescribed if the condition comes back after treatment. Because bacterial vaginosis increases your risk for sexually transmitted diseases, getting treated can help reduce your risk for chlamydia, gonorrhea, HIV, and herpes. HOME CARE INSTRUCTIONS   Only take over-the-counter or prescription medicines as directed by your health care provider.  If antibiotic medicine was prescribed, take it as directed. Make sure you finish it even if you start to feel better.  Tell all sexual partners that you have a vaginal infection. They should see their health care provider and be treated if they have problems, such as a mild rash or itching.  During treatment, it is important that you follow these instructions:  Avoid sexual activity or use condoms correctly.  Do not douche.  Avoid alcohol as directed by your health care provider.  Avoid breastfeeding as directed by your health care provider. SEEK MEDICAL CARE IF:   Your symptoms are not improving after 3 days of treatment.  You have increased discharge or pain.  You have a fever. MAKE SURE YOU:   Understand these instructions.  Will watch your condition.  Will get help right away if you are not doing well or get worse. FOR MORE INFORMATION  Centers for Disease Control and Prevention, Division of STD Prevention: [employer] American Sexual Health Association (ASHA): www.ashastd.org    This information is not intended to replace advice given to you by your health care provider. Make sure you discuss any questions you have with your health care provider.   Document Released: 11/29/2005 Document Revised: 12/20/2014 Document Reviewed: 07/11/2013 Elsevier Interactive Patient Education 2016 Elsevier Inc.  Ear Drops, Adult You have been diagnosed with a condition requiring you to put drops of medicine into your outer ear. HOME CARE INSTRUCTIONS   Put drops in the affected ear  as instructed. After putting the drops in, you will need to lie down with the affected ear facing up for ten minutes so the drops will remain in the ear canal and run down and fill the canal. Continue using the ear drops for as long as directed by your health care provider.  Prior to getting up, put a cotton ball gently in your ear canal. Leave enough of the cotton ball out so it can be easily removed. Do not attempt to push this down into the canal with a cotton-tipped swab or other instrument.  Do not irrigate or wash out your ears if you have had a perforated eardrum or mastoid surgery, or unless instructed to do so by your health care provider.  Keep appointments with your health care provider as instructed.  Finish all medicine, or use for the length of time prescribed by your health care provider. Continue the drops even if your problem seems to be doing well after a couple days, or continue as instructed. SEEK MEDICAL CARE IF:  You become worse or develop increasing pain.  You notice any unusual drainage from your ear (particularly if the drainage has a bad smell).  You develop hearing difficulties.  You experience a serious form of dizziness in which you feel as if the room is spinning, and you feel nauseated (vertigo).  The outside of your ear becomes red or swollen or both. This may be a sign of an allergic reaction. MAKE SURE YOU:   Understand these instructions.  Will watch your condition.  Will get help right away if you are not doing well or get worse.   This information is not intended to replace advice given to you by your health care provider. Make sure you discuss any questions you have with your health care provider.   Document Released: 11/23/2001 Document Revised: 12/20/2014 Document Reviewed: 06/26/2013 Elsevier Interactive Patient Education 2016 Elsevier Inc.  Cerumen Impaction The structures of the external ear canal secrete a waxy substance known as  cerumen. Excess cerumen can build up in the ear canal, causing a condition known as cerumen impaction. Cerumen impaction can cause ear pain and disrupt the function of the ear. The rate of cerumen production differs for each individual. In certain individuals, the configuration of the ear canal may decrease his or her ability to naturally remove cerumen. CAUSES Cerumen impaction is caused by excessive cerumen production or buildup. RISK FACTORS  Frequent use of swabs to clean ears.  Having narrow ear canals.  Having eczema.  Being dehydrated. SIGNS AND SYMPTOMS  Diminished hearing.  Ear drainage.  Ear pain.  Ear itch. TREATMENT Treatment may involve:  Over-the-counter or prescription ear drops to soften the cerumen.  Removal of cerumen by a health care provider. This may be done with:  Irrigation with warm water. This is the most common method of removal.  Ear curettes and other instruments.  Surgery. This may be done in severe cases. HOME CARE INSTRUCTIONS  Take medicines only as directed by your health care provider.  Do not insert objects into the ear with the intent of cleaning the ear. PREVENTION  Do not insert objects into the ear, even with the intent of cleaning the ear. Removing cerumen as a part of normal hygiene is not necessary, and the use of swabs in the ear canal is not recommended.  Drink enough water to keep your urine clear or pale yellow.  Control your eczema if you have it. SEEK MEDICAL CARE IF:  You develop ear pain.  You develop bleeding from the ear.  The cerumen does not clear after you use ear drops as directed.   This information is not intended to replace advice given to you by your health care provider. Make sure you discuss any questions you have with your health care provider.   Document Released: 01/06/2005 Document Revised: 12/20/2014 Document Reviewed: 07/16/2015 Elsevier Interactive Patient Education 2016 Elsevier  Inc.  Pelvic Pain, Female Female pelvic pain can be caused by many different things and start from a variety of places. Pelvic pain refers to pain that is located in the lower half of the abdomen and between your hips. The pain may occur over a short period of time (acute) or may be reoccurring (chronic). The cause of pelvic pain may be related to disorders affecting the female reproductive organs (gynecologic), but it may also be related to the bladder, kidney stones, an intestinal complication, or muscle or skeletal problems. Getting help right away for pelvic pain is important, especially if there has been severe, sharp, or a sudden onset of unusual pain. It is also important to get help right away because some types of pelvic pain can be life threatening.  CAUSES  Below are only some of the causes of pelvic pain. The causes of pelvic pain can be in one of several  categories.   Gynecologic.  Pelvic inflammatory disease.  Sexually transmitted infection.  Ovarian cyst or a twisted ovarian ligament (ovarian torsion).  Uterine lining that grows outside the uterus (endometriosis).  Fibroids, cysts, or tumors.  Ovulation.  Pregnancy.  Pregnancy that occurs outside the uterus (ectopic pregnancy).  Miscarriage.  Labor.  Abruption of the placenta or ruptured uterus.  Infection.  Uterine infection (endometritis).  Bladder infection.  Diverticulitis.  Miscarriage related to a uterine infection (septic abortion).  Bladder.  Inflammation of the bladder (cystitis).  Kidney stone(s).  Gastrointestinal.  Constipation.  Diverticulitis.  Neurologic.  Trauma.  Feeling pelvic pain because of mental or emotional causes (psychosomatic).  Cancers of the bowel or pelvis. EVALUATION  Your caregiver will want to take a careful history of your concerns. This includes recent changes in your health, a careful gynecologic history of your periods (menses), and a sexual history.  Obtaining your family history and medical history is also important. Your caregiver may suggest a pelvic exam. A pelvic exam will help identify the location and severity of the pain. It also helps in the evaluation of which organ system may be involved. In order to identify the cause of the pelvic pain and be properly treated, your caregiver may order tests. These tests may include:   A pregnancy test.  Pelvic ultrasonography.  An X-ray exam of the abdomen.  A urinalysis or evaluation of vaginal discharge.  Blood tests. HOME CARE INSTRUCTIONS   Only take over-the-counter or prescription medicines for pain, discomfort, or fever as directed by your caregiver.   Rest as directed by your caregiver.   Eat a balanced diet.   Drink enough fluids to make your urine clear or pale yellow, or as directed.   Avoid sexual intercourse if it causes pain.   Apply warm or cold compresses to the lower abdomen depending on which one helps the pain.   Avoid stressful situations.   Keep a journal of your pelvic pain. Write down when it started, where the pain is located, and if there are things that seem to be associated with the pain, such as food or your menstrual cycle.  Follow up with your caregiver as directed.  SEEK MEDICAL CARE IF:  Your medicine does not help your pain.  You have abnormal vaginal discharge. SEEK IMMEDIATE MEDICAL CARE IF:   You have heavy bleeding from the vagina.   Your pelvic pain increases.   You feel light-headed or faint.   You have chills.   You have pain with urination or blood in your urine.   You have uncontrolled diarrhea or vomiting.   You have a fever or persistent symptoms for more than 3 days.  You have a fever and your symptoms suddenly get worse.   You are being physically or sexually abused.   This information is not intended to replace advice given to you by your health care provider. Make sure you discuss any questions you  have with your health care provider.   Document Released: 10/26/2004 Document Revised: 08/20/2015 Document Reviewed: 03/20/2012 Elsevier Interactive Patient Education [employer]

## 2016-05-25 NOTE — ED Notes (Signed)
Pt stated that she did not want a pelvic exam, informed Erin- Charity fundraiserN.

## 2016-05-25 NOTE — ED Notes (Signed)
Gave pt drink with EDP permission.

## 2016-05-25 NOTE — ED Notes (Signed)
Pt c/o 4 day history of L ear pain and unable to hear out of the ear normally. She c/o pelvic pain x 1 month. Denies vaginal bleeding or discharge, fevers. She reports nausea and dizziness. She reports she told the clerk she had chest pain when she checked in but now that her heart test (EKG) was okay, she doesn't really care about that anymore. She is alert and breathing easily

## 2016-05-26 LAB — GC/CHLAMYDIA PROBE AMP (~~LOC~~) NOT AT ARMC
CHLAMYDIA, DNA PROBE: NEGATIVE
Neisseria Gonorrhea: NEGATIVE

## 2016-05-27 ENCOUNTER — Encounter (HOSPITAL_COMMUNITY): Payer: Self-pay | Admitting: Emergency Medicine

## 2016-05-27 DIAGNOSIS — K920 Hematemesis: Secondary | ICD-10-CM | POA: Insufficient documentation

## 2016-05-27 DIAGNOSIS — I10 Essential (primary) hypertension: Secondary | ICD-10-CM | POA: Insufficient documentation

## 2016-05-27 DIAGNOSIS — Z79899 Other long term (current) drug therapy: Secondary | ICD-10-CM | POA: Insufficient documentation

## 2016-05-27 LAB — COMPREHENSIVE METABOLIC PANEL
ALK PHOS: 49 U/L (ref 38–126)
ALT: 14 U/L (ref 14–54)
AST: 16 U/L (ref 15–41)
Albumin: 3.5 g/dL (ref 3.5–5.0)
Anion gap: 8 (ref 5–15)
BUN: 8 mg/dL (ref 6–20)
CALCIUM: 8.6 mg/dL — AB (ref 8.9–10.3)
CO2: 20 mmol/L — AB (ref 22–32)
CREATININE: 0.82 mg/dL (ref 0.44–1.00)
Chloride: 109 mmol/L (ref 101–111)
GFR calc non Af Amer: >60 mL/min (ref >60)
Glucose, Bld: 108 mg/dL — ABNORMAL HIGH (ref 65–99)
Potassium: 3.4 mmol/L — ABNORMAL LOW (ref 3.5–5.1)
SODIUM: 137 mmol/L (ref 135–145)
Total Bilirubin: 0.4 mg/dL (ref 0.3–1.2)
Total Protein: 7.6 g/dL (ref 6.5–8.1)

## 2016-05-27 LAB — URINALYSIS, ROUTINE W REFLEX MICROSCOPIC
BILIRUBIN URINE: NEGATIVE
Glucose, UA: NEGATIVE mg/dL
HGB URINE DIPSTICK: NEGATIVE
Ketones, ur: NEGATIVE mg/dL
Leukocytes, UA: NEGATIVE
Nitrite: NEGATIVE
PH: 5.5 (ref 5.0–8.0)
Protein, ur: NEGATIVE mg/dL
SPECIFIC GRAVITY, URINE: 1.018 (ref 1.005–1.030)

## 2016-05-27 LAB — CBC WITH DIFFERENTIAL/PLATELET
Basophils Absolute: 0 10*3/uL (ref 0.0–0.1)
Basophils Relative: 0 %
EOS ABS: 0.1 10*3/uL (ref 0.0–0.7)
Eosinophils Relative: 2 %
HEMATOCRIT: 36.2 % (ref 36.0–46.0)
HEMOGLOBIN: 11.9 g/dL — AB (ref 12.0–15.0)
LYMPHS ABS: 1.8 10*3/uL (ref 0.7–4.0)
LYMPHS PCT: 22 %
MCH: 25.6 pg — AB (ref 26.0–34.0)
MCHC: 32.9 g/dL (ref 30.0–36.0)
MCV: 77.8 fL — AB (ref 78.0–100.0)
Monocytes Absolute: 0.5 10*3/uL (ref 0.1–1.0)
Monocytes Relative: 7 %
NEUTROS ABS: 5.6 10*3/uL (ref 1.7–7.7)
NEUTROS PCT: 69 %
Platelets: 317 10*3/uL (ref 150–400)
RBC: 4.65 MIL/uL (ref 3.87–5.11)
RDW: 14 % (ref 11.5–15.5)
WBC: 8.1 10*3/uL (ref 4.0–10.5)

## 2016-05-27 LAB — POC URINE PREG, ED: Preg Test, Ur: NEGATIVE

## 2016-05-27 LAB — LIPASE, BLOOD: Lipase: 22 U/L (ref 11–51)

## 2016-05-27 NOTE — ED Notes (Signed)
Pt to ED with c/o vomiting lg amount of blood this pm. Pt st's she feels dizzy and lightheaded.  Also c/o upper chest pain.

## 2016-05-28 ENCOUNTER — Emergency Department (HOSPITAL_COMMUNITY)
Admission: EM | Admit: 2016-05-28 | Discharge: 2016-05-28 | Disposition: A | Discharge status: Home / Self Care | Payer: Self-pay | Attending: Emergency Medicine | Admitting: Emergency Medicine

## 2016-05-28 ENCOUNTER — Encounter (HOSPITAL_COMMUNITY): Payer: Self-pay | Admitting: Emergency Medicine

## 2016-05-28 ENCOUNTER — Emergency Department (HOSPITAL_COMMUNITY)
Admission: EM | Admit: 2016-05-28 | Discharge: 2016-05-28 | Disposition: A | Discharge status: Home / Self Care | Payer: Medicaid Other | Attending: Emergency Medicine | Admitting: Emergency Medicine

## 2016-05-28 DIAGNOSIS — Z79899 Other long term (current) drug therapy: Secondary | ICD-10-CM | POA: Insufficient documentation

## 2016-05-28 DIAGNOSIS — I10 Essential (primary) hypertension: Secondary | ICD-10-CM | POA: Insufficient documentation

## 2016-05-28 DIAGNOSIS — F329 Major depressive disorder, single episode, unspecified: Secondary | ICD-10-CM | POA: Insufficient documentation

## 2016-05-28 DIAGNOSIS — K920 Hematemesis: Secondary | ICD-10-CM

## 2016-05-28 DIAGNOSIS — R410 Disorientation, unspecified: Secondary | ICD-10-CM | POA: Insufficient documentation

## 2016-05-28 LAB — COMPREHENSIVE METABOLIC PANEL
ALBUMIN: 4.1 g/dL (ref 3.5–5.0)
ALT: 17 U/L (ref 14–54)
ANION GAP: 9 (ref 5–15)
AST: 21 U/L (ref 15–41)
Alkaline Phosphatase: 54 U/L (ref 38–126)
BUN: 10 mg/dL (ref 6–20)
CHLORIDE: 111 mmol/L (ref 101–111)
CO2: 21 mmol/L — AB (ref 22–32)
CREATININE: 0.75 mg/dL (ref 0.44–1.00)
Calcium: 8.8 mg/dL — ABNORMAL LOW (ref 8.9–10.3)
GFR calc non Af Amer: >60 mL/min (ref >60)
GLUCOSE: 133 mg/dL — AB (ref 65–99)
Potassium: 4.3 mmol/L (ref 3.5–5.1)
SODIUM: 141 mmol/L (ref 135–145)
Total Bilirubin: 0.4 mg/dL (ref 0.3–1.2)
Total Protein: 8.7 g/dL — ABNORMAL HIGH (ref 6.5–8.1)

## 2016-05-28 LAB — CBC WITH DIFFERENTIAL/PLATELET
Basophils Absolute: 0 10*3/uL (ref 0.0–0.1)
Basophils Relative: 0 %
Eosinophils Absolute: 0 10*3/uL (ref 0.0–0.7)
Eosinophils Relative: 0 %
HEMATOCRIT: 39.2 % (ref 36.0–46.0)
HEMOGLOBIN: 13 g/dL (ref 12.0–15.0)
LYMPHS ABS: 1 10*3/uL (ref 0.7–4.0)
LYMPHS PCT: 10 %
MCH: 26.3 pg (ref 26.0–34.0)
MCHC: 33.2 g/dL (ref 30.0–36.0)
MCV: 79.4 fL (ref 78.0–100.0)
Monocytes Absolute: 0.1 10*3/uL (ref 0.1–1.0)
Monocytes Relative: 1 %
NEUTROS PCT: 89 %
Neutro Abs: 8.3 10*3/uL — ABNORMAL HIGH (ref 1.7–7.7)
Platelets: 360 10*3/uL (ref 150–400)
RBC: 4.94 MIL/uL (ref 3.87–5.11)
RDW: 14.3 % (ref 11.5–15.5)
WBC: 9.4 10*3/uL (ref 4.0–10.5)

## 2016-05-28 LAB — RAPID URINE DRUG SCREEN, HOSP PERFORMED
AMPHETAMINES: NOT DETECTED
BARBITURATES: NOT DETECTED
Benzodiazepines: NOT DETECTED
Cocaine: NOT DETECTED
Opiates: NOT DETECTED
TETRAHYDROCANNABINOL: NOT DETECTED

## 2016-05-28 LAB — URINALYSIS, ROUTINE W REFLEX MICROSCOPIC
BILIRUBIN URINE: NEGATIVE
Glucose, UA: >1000 mg/dL — AB
Hgb urine dipstick: NEGATIVE
KETONES UR: NEGATIVE mg/dL
Leukocytes, UA: NEGATIVE
NITRITE: NEGATIVE
PH: 6 (ref 5.0–8.0)
PROTEIN: NEGATIVE mg/dL
Specific Gravity, Urine: 1.027 (ref 1.005–1.030)

## 2016-05-28 LAB — URINE MICROSCOPIC-ADD ON

## 2016-05-28 LAB — ETHANOL: Alcohol, Ethyl (B): <5 mg/dL (ref <5)

## 2016-05-28 LAB — HEMOGLOBIN AND HEMATOCRIT, BLOOD
HEMATOCRIT: 36.2 % (ref 36.0–46.0)
Hemoglobin: 12 g/dL (ref 12.0–15.0)

## 2016-05-28 MED ORDER — DIPHENHYDRAMINE HCL 50 MG/ML IJ SOLN
25.0000 mg | Freq: Once | INTRAMUSCULAR | Status: AC
  Administered 2016-05-28: 25 mg via INTRAVENOUS
  Filled 2016-05-28: qty 1

## 2016-05-28 MED ORDER — SODIUM CHLORIDE 0.9 % IV BOLUS (SEPSIS)
1000.0000 mL | Freq: Once | INTRAVENOUS | Status: AC
  Administered 2016-05-28: 1000 mL via INTRAVENOUS

## 2016-05-28 MED ORDER — IBUPROFEN 800 MG PO TABS: 800.0000 mg | ORAL_TABLET | Freq: Three times a day (TID) | ORAL | Status: DC

## 2016-05-28 MED ORDER — OMEPRAZOLE 20 MG PO CPDR: 20.0000 mg | DELAYED_RELEASE_CAPSULE | Freq: Two times a day (BID) | ORAL | Status: DC

## 2016-05-28 MED ORDER — METOCLOPRAMIDE HCL 5 MG/ML IJ SOLN
10.0000 mg | Freq: Once | INTRAMUSCULAR | Status: DC
  Filled 2016-05-28: qty 2

## 2016-05-28 MED ORDER — METRONIDAZOLE 500 MG PO TABS
500.0000 mg | ORAL_TABLET | Freq: Two times a day (BID) | ORAL | Status: DC
  Filled 2016-05-28: qty 1

## 2016-05-28 MED ORDER — PANTOPRAZOLE SODIUM 40 MG PO TBEC
40.0000 mg | DELAYED_RELEASE_TABLET | Freq: Every day | ORAL | Status: DC
  Filled 2016-05-28: qty 1

## 2016-05-28 MED ORDER — HYDROCODONE-ACETAMINOPHEN 5-325 MG PO TABS: 1.0000 | ORAL_TABLET | Freq: Four times a day (QID) | ORAL | Status: DC | PRN

## 2016-05-28 MED ORDER — LORAZEPAM 1 MG PO TABS: 1.0000 mg | ORAL_TABLET | Freq: Three times a day (TID) | ORAL | Status: DC | PRN

## 2016-05-28 MED ORDER — ONDANSETRON 4 MG PO TBDP: 4.0000 mg | ORAL_TABLET | Freq: Three times a day (TID) | ORAL | Status: DC | PRN

## 2016-05-28 MED ORDER — DEXAMETHASONE SODIUM PHOSPHATE 10 MG/ML IJ SOLN
10.0000 mg | Freq: Once | INTRAMUSCULAR | Status: AC
Start: 2016-05-28 — End: 2016-05-28
  Administered 2016-05-28: 10 mg via INTRAVENOUS
  Filled 2016-05-28: qty 1

## 2016-05-28 NOTE — ED Notes (Addendum)
NT getting patient dressed out, pt refusing stating that she can't stay because she has to go to work tomorrow. EDP notified

## 2016-05-28 NOTE — Discharge Instructions (Signed)
Although you have elected to leave prior to completion of your evaluation, it is very important that you continue to work with your primary care physician and our behavioral health colleagues for further evaluation of today's episode.  Do not hesitate to return here if you develop new, or concerning changes in your condition.

## 2016-05-28 NOTE — Progress Notes (Addendum)
CM assisted pt's mother to her room  Pt sitting up in bed talking with her husband Pt pleasant She confirms she does not have a pcp at this time CM offered pt a medicaid [place] list to assist her with choice of medicaid provider for f/u care  Pt encouraged to call her choice and to see if the dr was accepting new medicaid pts, call DSS to add accepting medicaid MD name prior to making an appt with medicaid accepting MD Pt voiced appreciation of resource  Entered in d/c instructions  medicaid coverage resources [place]: [phone] [address] [place], [place] [id number] [email or web address].ncdhhs.gov/ Use this website to assist with understanding your coverage & to renew application As a Medicaid client you MUST contact DSS/SSI each time you change address, move to another  county or another state to keep your address updated  [place] Transportation to Dr appts if you are have full Medicaid: [phone], [phone]

## 2016-05-28 NOTE — ED Notes (Signed)
NT at bedside attempting to draw labs.

## 2016-05-28 NOTE — ED Notes (Signed)
Pt ambulatory to room from lobby with tech; steady gait noted

## 2016-05-28 NOTE — ED Notes (Signed)
MD at bedside. 

## 2016-05-28 NOTE — BH Assessment (Addendum)
Assessment Note  [name] JesterRegina Feldstein is an 35 y.o. female that presents this date being disoriented and unaware how [facility]. Patient is very tangential and is unable to remember how she arrived at the ED. Admission note stated: "Pt with PTAR states she woke up and didn't recognize the man in the bed reported to be her husband and her 35 yo child. Patients husband states she then ran away and was acting strange, they were at the hospital this morning [facility] Dolly([facility] Alert/ Oriented x2." Patient denies any of the previous statements/actions associated with the admission note. Per patient's history (chart review) patient has been seen on multiple occasions for medical issues including two incidents that are associated with a MH diagnosis. [facility] for symptoms associated with post partum depression and again in May of 2015 for depression. Patient denies any SA use or prior inpatient admission/s. Patient was difficult to redirect and stated she has been having multiple migraines in the last two days reporting that [facility] for symptoms and was treated and released. Patient did state she was diagnosed with PTSD after her grandmother's death 15 years ago. Patient denies any history or current use of MH medications. Patient denies any S/I, H/I or AVH.Collateral gathered from mother Gillian Scarce( Angela Foust [phone]) who presented during the assessment stated patient was found this date at her rental complex office and was reported "throwing up blood." Patient's mother verified there was no prior MH admissions and was concerned about her daughter's current state. Patient is open to a voluntary admission to investigate possible causes associated with patient's altered mental status. Case was staffed with Shaune PollackLord DNP who recommended patient be observed and re-evaluated in the a.m. EDP Jeraldine LootsLockwood MD was informed of disposition.    Diagnosis: Altered Mental  Status  Past Medical History:  Past Medical History  Diagnosis Date  . Sickle cell trait (HCC)   . Pregnant   . Hypertension     Hx PIH with 2002 preg, no current problems  . Depression     PTSD (1997) major depression (2013) - no meds  . Migraine     otc med prn    Past Surgical History  Procedure Laterality Date  . Cesarean section      x 6  . Wisdom tooth extraction    . Cesarean section with bilateral tubal ligation Bilateral 03/02/2014    Procedure: REPEAT CESAREAN SECTION WITH BILATERAL TUBAL LIGATION;  Surgeon: Allie BossierMyra C Dove, MD;  Location: [facility] ORS;  Service: Obstetrics;  Laterality: Bilateral;  . Tubal ligation      Family History:  Family History  Problem Relation Age of Onset  . Hypertension Mother   . Hypertension Father   . Diabetes Father     Social History:  reports that she has never smoked. She has never used smokeless tobacco. She reports that she does not drink alcohol or use illicit drugs.  Additional Social History:  Alcohol / Drug Use Pain Medications: See MAR Prescriptions: See MAR Over the Counter: See MAR History of alcohol / drug use?: No history of alcohol / drug abuse  CIWA: CIWA-Ar BP: 163/89 mmHg Pulse Rate: 118 COWS:    Allergies:  Allergies  Allergen Reactions  . Fish Allergy Anaphylaxis and Rash    Any kind of seafood  . Penicillins Swelling and Rash    Pt. Says Amoxicillin is ok. Has patient had a PCN reaction causing  immediate rash, facial/tongue/throat swelling, SOB or lightheadedness with hypotension: YES Has patient had a PCN reaction causing severe rash involving mucus membranes or skin necrosis: NO Has patient had a PCN reaction that required hospitalization NO Has patient had a PCN reaction occurring within the last 10 years: NO If all of the above answers are "NO", then may proceed with Cephalosporin use.    Home Medications:  (Not in a hospital admission)  OB/GYN Status:  Patient's last menstrual period was  03/25/2016.  General Assessment Data Location of Assessment: [facility] TTS Assessment: In system Is this a Tele or Face-to-Face Assessment?: Face-to-Face Is this an Initial Assessment or a Re-assessment for this encounter?: Initial Assessment Marital status: Married [place] name: na Is patient pregnant?: No Pregnancy Status: No Living Arrangements: Spouse/significant other Can pt return to current living arrangement?: Yes Admission Status: Voluntary Is patient capable of signing voluntary admission?: Yes Referral Source: Self/Family/Friend Insurance type: None  Medical Screening Exam [facility] Walk-in ONLY) Medical Exam completed: Yes  Crisis Care Plan Living Arrangements: Spouse/significant other Legal Guardian: Other: (none) Name of Psychiatrist: None Name of Therapist: None  Education Status Is patient currently in school?: No Current Grade: na Highest grade of school patient has completed: 12 Name of school: na Contact person: na  Risk to self with the past 6 months Suicidal Ideation: No Has patient been a risk to self within the past 6 months prior to admission? : No Suicidal Intent: No Has patient had any suicidal intent within the past 6 months prior to admission? : No Is patient at risk for suicide?: No Suicidal Plan?: No Has patient had any suicidal plan within the past 6 months prior to admission? : No Access to Means: No What has been your use of drugs/alcohol within the last 12 months?: Denies Previous Attempts/Gestures: No How many times?: 0 Other Self Harm Risks: None Triggers for Past Attempts: Other (Comment) (None) Intentional Self Injurious Behavior: None Family Suicide History: No Recent stressful life event(s): Other (Comment) (medical issues) Persecutory voices/beliefs?: No Depression: No Depression Symptoms:  (None) Substance abuse history and/or treatment for substance abuse?: No Suicide prevention information given to non-admitted patients: Not  applicable  Risk to Others within the past 6 months Homicidal Ideation: No Does patient have any lifetime risk of violence toward others beyond the six months prior to admission? : No Thoughts of Harm to Others: No Current Homicidal Intent: No Current Homicidal Plan: No Access to Homicidal Means: No Identified Victim: na History of harm to others?: No Assessment of Violence: None Noted Violent Behavior Description: na Does patient have access to weapons?: No Criminal Charges Pending?: No Does patient have a court date: No Is patient on probation?: No  Psychosis Hallucinations: None noted Delusions: None noted  Mental Status Report Appearance/Hygiene: Unremarkable Eye Contact: Good Motor Activity: Freedom of movement Speech: Tangential Level of Consciousness: Irritable Mood: Pleasant Affect: Anxious Anxiety Level: Moderate Thought Processes: Tangential Judgement: Impaired Orientation: Person, Place Obsessive Compulsive Thoughts/Behaviors: None  Cognitive Functioning Concentration: Decreased Memory: Recent Impaired IQ: Average Insight: Fair Impulse Control: Fair Appetite: Fair Weight Loss: 0 Weight Gain: 0 Sleep: No Change Total Hours of Sleep: 6 Vegetative Symptoms: None  ADLScreening [facility] Assessment Services) Patient's cognitive ability adequate to safely complete daily activities?: Yes Patient able to express need for assistance with ADLs?: Yes Independently performs ADLs?: Yes (appropriate for developmental age)  Prior Inpatient Therapy Prior Inpatient Therapy: No Prior Therapy Dates: na Prior Therapy Facilty/Provider(s): na Reason for Treatment: na  Prior Outpatient Therapy Prior Outpatient Therapy: Yes Prior Therapy Dates: 2000 Prior Therapy Facilty/Provider(s): pt cannot remember Reason for Treatment: PTSD Does patient have an ACCT team?: No Does patient have Intensive In-House Services?  : No Does patient have [facility] services? : No Does  patient have P4CC services?: No  ADL Screening (condition at time of admission) Patient's cognitive ability adequate to safely complete daily activities?: Yes Is the patient deaf or have difficulty hearing?: No Does the patient have difficulty seeing, even when wearing glasses/contacts?: No Does the patient have difficulty concentrating, remembering, or making decisions?: Yes Patient able to express need for assistance with ADLs?: Yes Does the patient have difficulty dressing or bathing?: No Independently performs ADLs?: Yes (appropriate for developmental age) Does the patient have difficulty walking or climbing stairs?: No Weakness of Legs: None Weakness of Arms/Hands: None  Home Assistive Devices/Equipment Home Assistive Devices/Equipment: None  Therapy Consults (therapy consults require a physician order) PT Evaluation Needed: No OT Evalulation Needed: No SLP Evaluation Needed: No Abuse/Neglect Assessment (Assessment to be complete while patient is alone) Physical Abuse: Denies Verbal Abuse: Denies Sexual Abuse: Denies Exploitation of patient/patient's resources: Denies Self-Neglect: Denies Values / Beliefs Cultural Requests During Hospitalization: None Spiritual Requests During Hospitalization: None Consults Spiritual Care Consult Needed: No Social Work Consult Needed: No Merchant navy officer (For Healthcare) Does patient have an advance directive?: No Would patient like information on creating an advanced directive?: No - patient declined information (pt declines information)    Additional Information 1:1 In Past 12 Months?: No CIRT Risk: No Elopement Risk: No Does patient have medical clearance?: Yes     Disposition: Case was staffed with Shaune Pollack DNP who recommended patient be observed and re-evaluated in the a.m.     Disposition Initial Assessment Completed for this Encounter: Yes Disposition of Patient: Other dispositions Other disposition(s): Other (Comment)  (re-evalaute in the a.m.)  On Site Evaluation by:   Reviewed with Physician:    Alfredia Ferguson 05/28/2016 12:39 PM

## 2016-05-28 NOTE — ED Provider Notes (Addendum)
CSN: [id number]     Arrival date & time 05/28/16  1052 History   First MD Initiated Contact with Patient 05/28/16 1058     Chief Complaint  Patient presents with  . Altered Mental Status  . Headache     (Consider location/radiation/quality/duration/timing/severity/associated sxs/prior Treatment) HPI  Patient presents in a confused state. Patient complains of headache, but acknowledges a history of migraines. However, the patient seems to be unable to recall other medical history, social history. She has to reference her personal identification to identify her address, date of birth. She acknowledges being in another hospital yesterday afternoon episode of hematemesis, headache, and recalls that she has had no additional episodes of hematemesis since discharge. She states that today, just prior to ED arrival, she was startled when she awoke, found a baby calling for her, a tall, dark skin man standing over the baby. She called EMS, police are EMS reports that the man is actually the patient's husband, though she does not recognize him as such, they also identified that the patient has multiple children, including the baby she was concerned about. Patient denies being married, or having children. She denies homicidal or suicidal thoughts, or psychiatric disease.   Past Medical History  Diagnosis Date  . Sickle cell trait (HCC)   . Pregnant   . Hypertension     Hx PIH with 2002 preg, no current problems  . Depression     PTSD (1997) major depression (2013) - no meds  . Migraine     otc med prn   Past Surgical History  Procedure Laterality Date  . Cesarean section      x 6  . Wisdom tooth extraction    . Cesarean section with bilateral tubal ligation Bilateral 03/02/2014    Procedure: REPEAT CESAREAN SECTION WITH BILATERAL TUBAL LIGATION;  Surgeon: Allie BossierMyra C Dove, MD;  Location: [facility] ORS;  Service: Obstetrics;  Laterality: Bilateral;  . Tubal ligation     Family History  Problem  Relation Age of Onset  . Hypertension Mother   . Hypertension Father   . Diabetes Father    Social History  Substance Use Topics  . Smoking status: Never Smoker   . Smokeless tobacco: Never Used  . Alcohol Use: No     Comment: ocassionaly before pregnancy   OB History    Gravida Para Term Preterm AB TAB SAB Ectopic Multiple Living   7 6 6  1  1   6      Review of Systems  Unable to perform ROS: Mental status change      Allergies  Fish allergy and Penicillins  Home Medications   Prior to Admission medications   Medication Sig Start Date End Date Taking? Authorizing Provider  HYDROcodone-acetaminophen (NORCO/VICODIN) 5-325 MG tablet Take 1 tablet by mouth every 6 (six) hours as needed for severe pain. 05/25/16  Yes Danelle BerryLeisa Tapia, PA-C  ibuprofen (ADVIL,MOTRIN) 800 MG tablet Take 1 tablet (800 mg total) by mouth 3 (three) times daily. 05/25/16  Yes Danelle BerryLeisa Tapia, PA-C  metroNIDAZOLE (FLAGYL) 500 MG tablet Take 1 tablet (500 mg total) by mouth 2 (two) times daily. 05/25/16  Yes Danelle BerryLeisa Tapia, PA-C  ondansetron (ZOFRAN ODT) 4 MG disintegrating tablet Take 1 tablet (4 mg total) by mouth every 8 (eight) hours as needed for nausea. 05/25/16  Yes Danelle BerryLeisa Tapia, PA-C  omeprazole (PRILOSEC) 20 MG capsule Take 1 capsule (20 mg total) by mouth 2 (two) times daily. 05/28/16   Geoffery Lyonsouglas Delo, MD  BP 110/72 mmHg  Pulse 113  Temp(Src) 97.4 F (36.3 C) (Oral)  Resp 34  SpO2 100%  LMP 03/25/2016 Physical Exam  Constitutional: She appears well-developed and well-nourished. No distress.  HENT:  Head: Normocephalic and atraumatic.  Eyes: Conjunctivae and EOM are normal.  Cardiovascular: Normal rate and regular rhythm.   Pulmonary/Chest: Effort normal and breath sounds normal. No stridor. No respiratory distress.  Abdominal: She exhibits no distension.  Musculoskeletal: She exhibits no edema.  Neurological: She is alert. She displays no atrophy and no tremor. No cranial nerve deficit or sensory  deficit. She exhibits normal muscle tone. She displays no seizure activity.  Skin: Skin is warm and dry.  Psychiatric: Her speech is delayed and tangential. She is withdrawn and actively hallucinating. Thought content is delusional. Cognition and memory are impaired.  Nursing note and vitals reviewed.   ED Course  Procedures (including critical care time) Labs Review Labs Reviewed  COMPREHENSIVE METABOLIC PANEL - Abnormal; Notable for the following:    CO2 21 (*)    Glucose, Bld 133 (*)    Calcium 8.8 (*)    Total Protein 8.7 (*)    All other components within normal limits  CBC WITH DIFFERENTIAL/PLATELET - Abnormal; Notable for the following:    Neutro Abs 8.3 (*)    All other components within normal limits  URINALYSIS, ROUTINE W REFLEX MICROSCOPIC (NOT AT [facility]) - Abnormal; Notable for the following:    Glucose, UA >1000 (*)    All other components within normal limits  URINE MICROSCOPIC-ADD ON - Abnormal; Notable for the following:    Squamous Epithelial / LPF 0-5 (*)    Bacteria, UA RARE (*)    All other components within normal limits  ETHANOL  URINE RAPID DRUG SCREEN, HOSP PERFORMED     EMR notable for visit yesterday with hematemesis, but no ongoing bleeding, stable hemoglobin.     Update: Patient slightly more coherent, now recognize her husband. I discussed her case with our behavioral health team. With concern for new disorientation she will be admitted for observation   3:23 PM  patient substantially more oriented. We had a very lengthy conversation about evaluation thus far. Patient has some concern about overnight observation, management, but is willing to stay for further evaluation, management.   MDM  Young female presents after an episode of disorientation, possible amnesia, with some concern for new psychotic behavior. Patient has some improvement in the emergency department, both concern for new atypical behavior, disorientation she required evaluation,  admission to our behavioral health observation unit. No evidence for neurologic dysfunction beyond disorientation, no evidence for ongoing infection.  Gerhard Munch, MD 05/28/16 1422  Gerhard Munch, MD 05/28/16 1524   3:37 PM   Patient now electing to leave. She is awake, alert, oriented appropriately, has full understanding of our initial evaluation, concerns for her departure, continues to request discharge.  Gerhard Munch, MD 05/28/16 1537

## 2016-05-28 NOTE — ED Notes (Signed)
Pt giving us hard time about drawing labs.  RN is busy and cannot offer assistance.  Pt just wants to leave and says labs are not necessary.   Psychologist is in with patient trying to talk to her.  RN and EDP aware.

## 2016-05-28 NOTE — ED Notes (Signed)
BH Counselor at bedside.  

## 2016-05-28 NOTE — ED Notes (Addendum)
Spouse out front to come back to nursing station

## 2016-05-28 NOTE — ED Notes (Signed)
Updated note: Patients husband at bedside with small child, patient does not seem to be uncomfortable with his presence, pt was concerned about who dressed the child. Pt not in any distress.   BH counselor called to report to bedside to speak with husband.

## 2016-05-28 NOTE — ED Notes (Signed)
Bed: WA17 Expected date:  Expected time:  Means of arrival:  Comments: Ems HTN, tachycardia, psych

## 2016-05-28 NOTE — ED Notes (Signed)
Pt with PTAR states she woke up and didn't recognize the man in the bed reported to be her husband and her 35 yo child. Patients husband states she then ran away and was acting strange, they were at the hospital this morning [name] [name]([facility] Alert/ Oriented x2. Reports HA 9/10 and is wearing sunglasses. Stroke screen negative.

## 2016-05-28 NOTE — ED Notes (Signed)
Pt refused dc vital signs, stating she had to leave

## 2016-05-28 NOTE — BH Assessment (Signed)
[facility] Assessment Progress Note    Per Shaune PollackLord DNP patient will be re-evaluated in the a.m.

## 2016-05-28 NOTE — BH Assessment (Signed)
[facility] Assessment Progress Note Per Jeraldine LootsLockwood MD, patient was discharged to her residence after patient declined an inpatient admission.

## 2016-05-28 NOTE — ED Provider Notes (Signed)
CSN: [id number]     Arrival date & time 05/27/16  2120 History  By signing my name below, I, Ginette Pitman, attest that this documentation has been prepared under the direction and in the presence of Geoffery Lyons, MD.  Electronically signed: Ginette Pitman, ED Scribe. 05/28/2016. 2:24 AM.    Chief Complaint  Patient presents with  . Hematemesis    The history is provided by the patient. No language interpreter was used.   HPI Comments: [name] [name] is a 35 y.o. female with PMHx of HTN, migraines, and sickle cell trait who presents to the Emergency Department complaining of bright red hematemesis x 1 with onset around 5:00 pm this evening. Pt states her chest felt stiff with sharp abdominal pain and cramps, as well as dizziness and her vision going black. Pt denies any prior h/c of similar symptoms. She denies any blood noted in stool.She also reports an associated migraine which recently came on while in the ED. She denies having an endoscopy procedures, or prior h/o ulcers.  PSHx includes cesarean sections. Past Medical History  Diagnosis Date  . Sickle cell trait (HCC)   . Pregnant   . Hypertension     Hx PIH with 2002 preg, no current problems  . Depression     PTSD (1997) major depression (2013) - no meds  . Migraine     otc med prn   Past Surgical History  Procedure Laterality Date  . Cesarean section      x 6  . Wisdom tooth extraction    . Cesarean section with bilateral tubal ligation Bilateral 03/02/2014    Procedure: REPEAT CESAREAN SECTION WITH BILATERAL TUBAL LIGATION;  Surgeon: Allie Bossier, MD;  Location: [facility] ORS;  Service: Obstetrics;  Laterality: Bilateral;  . Tubal ligation     Family History  Problem Relation Age of Onset  . Hypertension Mother   . Hypertension Father   . Diabetes Father    Social History  Substance Use Topics  . Smoking status: Never Smoker   . Smokeless tobacco: Never Used  . Alcohol Use: No     Comment: ocassionaly before pregnancy   OB  History    Gravida Para Term Preterm AB TAB SAB Ectopic Multiple Living   Review of Systems  Eyes: Positive for visual disturbance.  Gastrointestinal: Positive for vomiting and abdominal pain.  Neurological: Positive for dizziness and headaches.  All other systems reviewed and are negative.     Allergies  Fish allergy and Penicillins  Home Medications   Prior to Admission medications   Medication Sig Start Date End Date Taking? Authorizing Provider  HYDROcodone-acetaminophen (NORCO/VICODIN) 5-325 MG tablet Take 1 tablet by mouth every 6 (six) hours as needed for severe pain. 05/25/16   Danelle Berry, PA-C  ibuprofen (ADVIL,MOTRIN) 800 MG tablet Take 1 tablet (800 mg total) by mouth 3 (three) times daily. 05/25/16   Danelle Berry, PA-C  metroNIDAZOLE (FLAGYL) 500 MG tablet Take 1 tablet (500 mg total) by mouth 2 (two) times daily. 05/25/16   Danelle Berry, PA-C  ondansetron (ZOFRAN ODT) 4 MG disintegrating tablet Take 1 tablet (4 mg total) by mouth every 8 (eight) hours as needed for nausea. 05/25/16   Danelle Berry, PA-C  rizatriptan (MAXALT) 5 MG tablet Take 10 mg by mouth as needed for migraine. May repeat in 2 hours if needed    Historical Provider, MD   BP 120/73  mmHg  Pulse 83  Resp 18  SpO2 97%  LMP 03/25/2016 Physical Exam  Constitutional: She is oriented to person, place, and time. She appears well-developed and well-nourished. No distress.  HENT:  Head: Normocephalic and atraumatic.  Neck: Normal range of motion.  Cardiovascular: Normal rate, regular rhythm and normal heart sounds.   Pulmonary/Chest: Effort normal and breath sounds normal. She has no wheezes.  Abdominal: Soft.  Musculoskeletal: Normal range of motion.  Neurological: She is alert and oriented to person, place, and time.  Skin: Skin is warm and dry. She is not diaphoretic.  Psychiatric: She has a normal mood and affect. Judgment normal.  Nursing note and vitals reviewed.   ED Course   Procedures  DIAGNOSTIC STUDIES: Oxygen Saturation is 97% on RA, normal by my interpretation.  COORDINATION OF CARE: 2:24 AM Discussed treatment plan of H&H, CBC with differential, CMP, administering benadryl, reglan, and decadronwith pt at bedside and pt agreed to plan.  Labs Review Labs Reviewed  CBC WITH DIFFERENTIAL/PLATELET - Abnormal; Notable for the following:    Hemoglobin 11.9 (*)    MCV 77.8 (*)    MCH 25.6 (*)    All other components within normal limits  COMPREHENSIVE METABOLIC PANEL - Abnormal; Notable for the following:    Potassium 3.4 (*)    CO2 20 (*)    Glucose, Bld 108 (*)    Calcium 8.6 (*)    All other components within normal limits  URINALYSIS, ROUTINE W REFLEX MICROSCOPIC (NOT AT [facility]) - Abnormal; Notable for the following:    APPearance CLOUDY (*)    All other components within normal limits  LIPASE, BLOOD  POC URINE PREG, ED    Imaging Review No results found. I have personally reviewed and evaluated these images and lab results as part of my medical decision-making.    MDM   Final diagnoses:  None   Patient presents after an episode of hematemesis. She is not having any abdominal pain and denies any melena, dizziness, or lightheadedness. She has been in the emergency department for approximately 5 hours. She has had no further vomiting and has remained hemodynamically stable. Her hemoglobin was initially 11.9 and rechecked 4 hours later and found to be 12. I am uncertain as to where the bleeding came from, however I suspect it was likely a Mallory-Weiss tear. She will be treated with omeprazole and when necessary follow-up with gastroenterology.  I personally performed the services described in this documentation, which was scribed in my presence. The recorded information has been reviewed and is accurate.       Geoffery Lyonsouglas Neida Ellegood, MD 05/28/16 [phone]

## 2016-05-28 NOTE — Discharge Instructions (Signed)
Omeprazole as prescribed.  Return to the emergency department if you develop high fever, severe abdominal pain, bloody stool, lightheadedness, or other new and concerning symptoms.  Call [facility] to arrange a follow-up appointment if your symptoms persist. The contact information has been provided in this discharge summary for you to make these arrangements.   Hematemesis Hematemesis is when you vomit blood. It is a sign of bleeding in the upper part of your digestive tract. This is also called your gastrointestinal (GI) tract. Your upper GI tract includes your mouth, throat, esophagus, stomach, and the first part of your small intestine (duodenum).  Hematemesis is usually caused by bleeding from your esophagus or stomach. You may suddenly vomit bright red blood. You might also vomit old blood. It may look like coffee grounds. You may also have other symptoms, such as:  Stomach pain.  Heartburn.  Black and tarry stool.  HOME CARE INSTRUCTIONS  Watch your hematemesis for any changes. The following actions may help to lessen any discomfort you are feeling:  Take medicines only as directed by your health care provider. Do not take aspirin, ibuprofen, or any other anti-inflammatory medicine without approval from your health care provider.  Rest as needed.  Drink small sips of clear liquids often, as long as you can keep them down. Try to drink enough fluids to keep your urine clear or pale yellow.  Do not drink alcohol.  Do not use any tobacco products, including cigarettes, chewing tobacco, or electronic cigarettes. If you need help quitting, ask your health care provider.  Keep all follow-up visits as directed by your health care provider. This is important. SEEK MEDICAL CARE IF:   The vomiting of blood worsens, or begins again after it has stopped.  You have persistent stomach pain.  You have nausea, indigestion, or heartburn.  You feel weak or dizzy. SEEK  IMMEDIATE MEDICAL CARE IF:   You faint or feel extremely weak.  You have a rapid heartbeat.  You are urinating less than normal or not at all.  You have persistent vomiting.  You vomit large amounts of bloody or dark material.  You vomit bright red blood.  You pass large, dark, or bloody stools.  You have chest pain or trouble breathing.   This information is not intended to replace advice given to you by your health care provider. Make sure you discuss any questions you have with your health care provider.   Document Released: 01/06/2005 Document Revised: 12/20/2014 Document Reviewed: 07/24/2014 Elsevier Interactive Patient Education [employer]2016 Elsevier Inc.

## 2016-05-28 NOTE — ED Notes (Signed)
[facility]

## 2016-05-29 ENCOUNTER — Emergency Department (HOSPITAL_COMMUNITY): Payer: Medicaid Other

## 2016-05-29 ENCOUNTER — Observation Stay (HOSPITAL_COMMUNITY)
Admission: EM | Admit: 2016-05-29 | Discharge: 2016-05-29 | Discharge status: Against Medical Advice | Payer: Medicaid Other | Attending: Internal Medicine | Admitting: Internal Medicine

## 2016-05-29 ENCOUNTER — Encounter (HOSPITAL_COMMUNITY): Payer: Self-pay

## 2016-05-29 DIAGNOSIS — R079 Chest pain, unspecified: Secondary | ICD-10-CM

## 2016-05-29 DIAGNOSIS — G43909 Migraine, unspecified, not intractable, without status migrainosus: Secondary | ICD-10-CM | POA: Insufficient documentation

## 2016-05-29 DIAGNOSIS — Z6835 Body mass index (BMI) 35.0-35.9, adult: Secondary | ICD-10-CM | POA: Insufficient documentation

## 2016-05-29 DIAGNOSIS — R51 Headache: Secondary | ICD-10-CM

## 2016-05-29 DIAGNOSIS — D72829 Elevated white blood cell count, unspecified: Secondary | ICD-10-CM

## 2016-05-29 DIAGNOSIS — F32A Depression, unspecified: Secondary | ICD-10-CM | POA: Diagnosis present

## 2016-05-29 DIAGNOSIS — E669 Obesity, unspecified: Secondary | ICD-10-CM | POA: Diagnosis present

## 2016-05-29 DIAGNOSIS — R519 Headache, unspecified: Secondary | ICD-10-CM

## 2016-05-29 DIAGNOSIS — R55 Syncope and collapse: Principal | ICD-10-CM

## 2016-05-29 DIAGNOSIS — R0789 Other chest pain: Secondary | ICD-10-CM | POA: Insufficient documentation

## 2016-05-29 DIAGNOSIS — Z791 Long term (current) use of non-steroidal anti-inflammatories (NSAID): Secondary | ICD-10-CM | POA: Insufficient documentation

## 2016-05-29 DIAGNOSIS — I10 Essential (primary) hypertension: Secondary | ICD-10-CM | POA: Insufficient documentation

## 2016-05-29 DIAGNOSIS — F419 Anxiety disorder, unspecified: Secondary | ICD-10-CM | POA: Insufficient documentation

## 2016-05-29 DIAGNOSIS — Z79899 Other long term (current) drug therapy: Secondary | ICD-10-CM | POA: Insufficient documentation

## 2016-05-29 DIAGNOSIS — D573 Sickle-cell trait: Secondary | ICD-10-CM

## 2016-05-29 DIAGNOSIS — F329 Major depressive disorder, single episode, unspecified: Secondary | ICD-10-CM

## 2016-05-29 LAB — CBC
HCT: 35.8 % — ABNORMAL LOW (ref 36.0–46.0)
Hemoglobin: 11.7 g/dL — ABNORMAL LOW (ref 12.0–15.0)
MCH: 25.7 pg — AB (ref 26.0–34.0)
MCHC: 32.7 g/dL (ref 30.0–36.0)
MCV: 78.5 fL (ref 78.0–100.0)
Platelets: 321 10*3/uL (ref 150–400)
RBC: 4.56 MIL/uL (ref 3.87–5.11)
RDW: 14.2 % (ref 11.5–15.5)
WBC: 14.4 10*3/uL — ABNORMAL HIGH (ref 4.0–10.5)

## 2016-05-29 LAB — BASIC METABOLIC PANEL
Anion gap: 5 (ref 5–15)
BUN: 12 mg/dL (ref 6–20)
CALCIUM: 8.9 mg/dL (ref 8.9–10.3)
CO2: 24 mmol/L (ref 22–32)
CREATININE: 0.85 mg/dL (ref 0.44–1.00)
Chloride: 111 mmol/L (ref 101–111)
GFR calc Af Amer: >60 mL/min (ref >60)
GLUCOSE: 102 mg/dL — AB (ref 65–99)
Potassium: 3.7 mmol/L (ref 3.5–5.1)
Sodium: 140 mmol/L (ref 135–145)

## 2016-05-29 LAB — I-STAT TROPONIN, ED: TROPONIN I, POC: 0 ng/mL (ref 0.00–0.08)

## 2016-05-29 MED ORDER — METRONIDAZOLE 500 MG PO TABS: 500.0000 mg | ORAL_TABLET | Freq: Two times a day (BID) | ORAL | Status: DC

## 2016-05-29 MED ORDER — GI COCKTAIL ~~LOC~~: 30.0000 mL | Freq: Four times a day (QID) | ORAL | Status: DC | PRN

## 2016-05-29 MED ORDER — HYDROCODONE-ACETAMINOPHEN 5-325 MG PO TABS: 1.0000 | ORAL_TABLET | Freq: Four times a day (QID) | ORAL | Status: DC | PRN

## 2016-05-29 MED ORDER — SODIUM CHLORIDE 0.9 % IV SOLN: INTRAVENOUS | Status: DC

## 2016-05-29 MED ORDER — ENOXAPARIN SODIUM 40 MG/0.4ML ~~LOC~~ SOLN
40.0000 mg | SUBCUTANEOUS | Status: DC
  Filled 2016-05-29: qty 0.4

## 2016-05-29 MED ORDER — ONDANSETRON HCL 4 MG/2ML IJ SOLN: 4.0000 mg | Freq: Four times a day (QID) | INTRAMUSCULAR | Status: DC | PRN

## 2016-05-29 MED ORDER — SODIUM CHLORIDE 0.9% FLUSH: 3.0000 mL | Freq: Two times a day (BID) | INTRAVENOUS | Status: DC

## 2016-05-29 MED ORDER — ENOXAPARIN SODIUM 40 MG/0.4ML ~~LOC~~ SOLN: 40.0000 mg | SUBCUTANEOUS | Status: DC

## 2016-05-29 MED ORDER — PANTOPRAZOLE SODIUM 40 MG PO TBEC: 40.0000 mg | DELAYED_RELEASE_TABLET | Freq: Every day | ORAL | Status: DC

## 2016-05-29 MED ORDER — IBUPROFEN 800 MG PO TABS
800.0000 mg | ORAL_TABLET | Freq: Once | ORAL | Status: AC
  Administered 2016-05-29: 800 mg via ORAL
  Filled 2016-05-29: qty 1

## 2016-05-29 MED ORDER — ONDANSETRON 4 MG PO TBDP
4.0000 mg | ORAL_TABLET | Freq: Three times a day (TID) | ORAL | Status: DC | PRN
  Filled 2016-05-29: qty 1

## 2016-05-29 MED ORDER — ACETAMINOPHEN 325 MG PO TABS
650.0000 mg | ORAL_TABLET | ORAL | Status: DC | PRN
Start: 2016-05-29 — End: 2016-05-29

## 2016-05-29 MED ORDER — METRONIDAZOLE 500 MG PO TABS: 500.0000 mg | ORAL_TABLET | Freq: Three times a day (TID) | ORAL | Status: DC

## 2016-05-29 MED ORDER — PROCHLORPERAZINE EDISYLATE 5 MG/ML IJ SOLN
10.0000 mg | Freq: Once | INTRAMUSCULAR | Status: DC
  Filled 2016-05-29: qty 2

## 2016-05-29 MED ORDER — ALPRAZOLAM 0.25 MG PO TABS: 0.2500 mg | ORAL_TABLET | Freq: Two times a day (BID) | ORAL | Status: DC | PRN

## 2016-05-29 MED ORDER — LORAZEPAM 1 MG PO TABS
1.0000 mg | ORAL_TABLET | Freq: Once | ORAL | Status: AC
  Administered 2016-05-29: 1 mg via ORAL
  Filled 2016-05-29: qty 1

## 2016-05-29 NOTE — Progress Notes (Signed)
Patient is  refusing blood works. MD notified.

## 2016-05-29 NOTE — H&P (Signed)
History and Physical    [name] [name] ZOX:[id number] DOB: [birth year] DOA: 05/29/2016   PCP: No PCP Per Patient   Patient coming from:  Home  Chief Complaint: Syncope  HPI: [name] [name] is a 35 y.o. female with medical history significant for sickle cell trait, hypertension, depression, history of migraines, initially presenting on 616ith chest pain, following a syncopal episode, I recommended to stay for observation, but she elected to go home.She is presenting today with the same symptoms. She had a syncopal episode occurring just prior to the ED arrival, combining by chest tightness, dizziness, diaphoresis. She denies any trauma to the head.Since that time, she has mild sternal chest tightness, intermittent  Headaches,. No double vision or blurred vision. No seizures. She denies any changes in her medications. There is no confusion, disorientation . Of note,she may have some underlying behavioral issues according to her husband. She denies any homicidal or suicidal thoughts. She denies any shortness of breath. No chest pain at the time of evaluation.to the significant amount of exertion and work due to caring for her 6 children. She admits to significant amount of caffeine intake, taking many sodas during the day, including Mountain and Coke. She admits to not taking other liquid intake, including plain water. She has also not been consuming normal amount of food over the last 3 or 4 days. She report ssubjective fevers, and chills. No night sweats. She denies any surgeries. She has been treated for trichomonas with Flagyl as of 6/13 she denies any ticks or insect bites. She denies any risk factors for HIV or hepatitis. She denies any recent trips. She denies being prior pregnant.   ED Course:  BP 106/62 mmHg  Pulse 97  Temp(Src) 98.4 F (36.9 C) (Oral)  Resp 18  Ht 5\' 4"  (1.626 m)  Wt 81.647 kg (180 lb)  BMI 30.88 kg/m2  SpO2 99%  LMP 03/25/2016  Chest x-ray with no acute abnormalities    Troponins 0.0  EKG With sinus rhythm, occasional PVCs, rate 85, with nonspecific T wave changes, otherwise unremarkable. Glucose 102  White count 14.4  Hemoglobin 11.7    Urinalysis  Neg Nitrites HCG is negative Blood cultures and urine cultures are pending CT of the head Negative for acute intracranial abnormalities  Review of Systems: As per HPI otherwise 10 point review of systems negative.   Past Medical History  Diagnosis Date  . Sickle cell trait (HCC)   . Pregnant   . Hypertension     Hx PIH with 2002 preg, no current problems  . Depression     PTSD (1997) major depression (2013) - no meds  . Migraine     otc med prn    Past Surgical History  Procedure Laterality Date  . Cesarean section      x 6  . Wisdom tooth extraction    . Cesarean section with bilateral tubal ligation Bilateral 03/02/2014    Procedure: REPEAT CESAREAN SECTION WITH BILATERAL TUBAL LIGATION;  Surgeon: Allie Bossier, MD;  Location: [facility];  Service: Obstetrics;  Laterality: Bilateral;  . Tubal ligation      Social History Social History   Social History  . Marital Status: Married    Spouse Name: N/A  . Number of Children: N/A  . Years of Education: N/A   Occupational History  . Not on file.   Social History Main Topics  . Smoking status: Never Smoker   . Smokeless tobacco: Never Used  . Alcohol Use: No  Comment: ocassionaly before pregnancy  . Drug Use: No  . Sexual Activity: Yes    Birth Control/ Protection: Surgical   Other Topics Concern  . Not on file   Social History Narrative   ** Merged History Encounter **         Allergies  Allergen Reactions  . Fish Allergy Anaphylaxis and Rash    Any kind of seafood  . Penicillins Swelling and Rash    Pt. Says Amoxicillin is ok. Has patient had a PCN reaction causing immediate rash, facial/tongue/throat swelling, SOB or lightheadedness with hypotension: YES Has patient had a PCN reaction causing severe rash involving mucus  membranes or skin necrosis: NO Has patient had a PCN reaction that required hospitalization NO Has patient had a PCN reaction occurring within the last 10 years: NO If all of the above answers are "NO", then may proceed with Cephalosporin use.    Family History  Problem Relation Age of Onset  . Hypertension Mother   . Hypertension Father   . Diabetes Father       Prior to Admission medications   Medication Sig Start Date End Date Taking? Authorizing Provider  HYDROcodone-acetaminophen (NORCO/VICODIN) 5-325 MG tablet Take 1 tablet by mouth every 6 (six) hours as needed for severe pain. 05/25/16  Yes Danelle Berry, PA-C  ibuprofen (ADVIL,MOTRIN) 800 MG tablet Take 1 tablet (800 mg total) by mouth 3 (three) times daily. 05/25/16  Yes Danelle Berry, PA-C  metroNIDAZOLE (FLAGYL) 500 MG tablet Take 1 tablet (500 mg total) by mouth 2 (two) times daily. 05/25/16  Yes Danelle Berry, PA-C  omeprazole (PRILOSEC) 20 MG capsule Take 1 capsule (20 mg total) by mouth 2 (two) times daily. 05/28/16  Yes Geoffery Lyons, MD  ondansetron (ZOFRAN ODT) 4 MG disintegrating tablet Take 1 tablet (4 mg total) by mouth every 8 (eight) hours as needed for nausea. 05/25/16  Yes Danelle Berry, PA-C    Physical Exam:    Filed Vitals:   05/29/16 1415 05/29/16 1430 05/29/16 1445 05/29/16 1545  BP: 111/88 121/81 71/46 106/62  Pulse: 74 88 87 97  Temp:      TempSrc:      Resp: Height:      Weight:      SpO2: 100% 100% 100% 99%       Constitutional: NAD, calm, comfortable  Filed Vitals:   05/29/16 1415 05/29/16 1430 05/29/16 1445 05/29/16 1545  BP: 111/88 121/81 71/46 106/62  Pulse: 74 88 87 97  Temp:      TempSrc:      Resp: Height:      Weight:      SpO2: 100% 100% 100% 99%   Eyes: PERRL, lids and conjunctivae normal ENMT: Mucous membranes are moist. Posterior pharynx clear of any exudate or lesions. Normal dentition.  Neck: normal, supple, no masses, no thyromegaly Respiratory:  clear to auscultation bilaterally, no wheezing, no crackles. Normal respiratory effort. No accessory muscle use.  Cardiovascular: Regular rate and rhythm, no murmurs / rubs / gallops. No extremity edema. 2+ pedal pulses. No carotid bruits.  Abdomen: no tenderness, no masses palpated. No hepatosplenomegaly. Bowel sounds positive.  Musculoskeletal: no clubbing / cyanosis. No joint deformity upper and lower extremities. Good ROM, no contractures. Normal muscle tone.  Skin: no rashes, lesions, ulcers.  Neurologic: CN 2-12 grossly intact. Sensation intact, DTR normal. Strength 5/5 in all 4.  Psychiatric: Normal judgment and insight. Alert and oriented x 3.  Normal mood.     Labs on Admission: I have personally reviewed following labs and imaging studies  CBC:  Recent Labs Lab 05/25/16 1711 05/27/16 2202 05/28/16 0251 05/28/16 1314 05/29/16 0938  WBC 7.8 8.1  --  9.4 14.4*  NEUTROABS  --  5.6  --  8.3*  --   HGB 12.3 11.9* 12.0 13.0 11.7*  HCT 38.1 36.2 36.2 39.2 35.8*  MCV 77.8* 77.8*  --  79.4 78.5  PLT 303 317  --  360 321    Basic Metabolic Panel:  Recent Labs Lab 05/25/16 1711 05/27/16 2202 05/28/16 1314 05/29/16 0938  NA 137 137 141 140  K 3.8 3.4* 4.3 3.7  CL 107 109 111 111  CO2 25 20* 21* 24  GLUCOSE 90 108* 133* 102*  BUN 8 8 10 12   CREATININE 0.73 0.82 0.75 0.85  CALCIUM 8.8* 8.6* 8.8* 8.9    GFR: Estimated Creatinine Clearance: 95.5 mL/min (by C-G formula based on Cr of 0.85).  Liver Function Tests:  Recent Labs Lab 05/27/16 2202 05/28/16 1314  AST 16 21  ALT 14 17  ALKPHOS 49 54  BILITOT 0.4 0.4  PROT 7.6 8.7*  ALBUMIN 3.5 4.1    Recent Labs Lab 05/27/16 2202  LIPASE 22   No results for input(s): AMMONIA in the last 168 hours.  Coagulation Profile: No results for input(s): INR, PROTIME in the last 168 hours.  Cardiac Enzymes: No results for input(s): CKTOTAL, CKMB, CKMBINDEX, TROPONINI in the last 168 hours.  BNP (last 3 results) No  results for input(s): PROBNP in the last 8760 hours.  HbA1C: No results for input(s): HGBA1C in the last 72 hours.  CBG: No results for input(s): GLUCAP in the last 168 hours.  Lipid Profile: No results for input(s): CHOL, HDL, LDLCALC, TRIG, CHOLHDL, LDLDIRECT in the last 72 hours.  Thyroid Function Tests: No results for input(s): TSH, T4TOTAL, FREET4, T3FREE, THYROIDAB in the last 72 hours.  Anemia Panel: No results for input(s): VITAMINB12, FOLATE, FERRITIN, TIBC, IRON, RETICCTPCT in the last 72 hours.  Urine analysis:    Component Value Date/Time   COLORURINE YELLOW 05/28/2016 1303   APPEARANCEUR CLEAR 05/28/2016 1303   LABSPEC 1.027 05/28/2016 1303   PHURINE 6.0 05/28/2016 1303   GLUCOSEU >1000* 05/28/2016 1303   HGBUR NEGATIVE 05/28/2016 1303   BILIRUBINUR NEGATIVE 05/28/2016 1303   KETONESUR NEGATIVE 05/28/2016 1303   PROTEINUR NEGATIVE 05/28/2016 1303   UROBILINOGEN 0.2 08/16/2015 2153   NITRITE NEGATIVE 05/28/2016 1303   LEUKOCYTESUR NEGATIVE 05/28/2016 1303    Sepsis Labs: Invalid input(s): PROCALCITONIN, LACTICIDVEN ) Recent Results (from the past 240 hour(s))  Wet prep, genital     Status: Abnormal   Collection Time: 05/25/16 10:01 PM  Result Value Ref Range Status   Yeast Wet Prep HPF POC NONE SEEN NONE SEEN Final   Trich, Wet Prep NONE SEEN NONE SEEN Final   Clue Cells Wet Prep HPF POC PRESENT (A) NONE SEEN Final   WBC, Wet Prep HPF POC FEW (A) NONE SEEN Final   Sperm NONE SEEN  Final     Radiological Exams on Admission: Dg Chest 2 View  05/29/2016  CLINICAL DATA:  Headache and chest tightness for 2 day EXAM: CHEST  2 VIEW COMPARISON:  01/01/2016 FINDINGS: Upper normal heart size. Clear lungs. No pneumothorax. No pleural effusion. Stable scoliosis. IMPRESSION: No active cardiopulmonary disease. Electronically Signed   By: Jolaine ClickArthur  Hoss M.D.   On: 05/29/2016 10:58   Ct Head Wo  Contrast  05/29/2016  CLINICAL DATA:  Dizziness EXAM: CT HEAD WITHOUT  CONTRAST TECHNIQUE: Contiguous axial images were obtained from the base of the skull through the vertex without intravenous contrast. COMPARISON:  03/15/2012 FINDINGS: Brain parenchyma, ventricular system, and extra-axial space are within normal limits. No mass effect, midline shift, or acute hemorrhage. Cranium is intact. The left maxillary sinus is opacified. IMPRESSION: No acute intracranial pathology. Electronically Signed   By: Jolaine Click M.D.   On: 05/29/2016 11:14    EKG: Independently reviewed.  Assessment/Plan Principal Problem:   Syncope Active Problems:   Sickle cell trait (HCC)   Depression   Obesity   Chest pain   Leukocytosis   Syncope with intermittent chest pain. Heart score 3 This is recurrent, in the setting of poor oral intake, hypotension.  H/o anxiety. Significant caffeine intake. Labs, EKG unrevealing. CT head negative. Rule out infection. WBC 14 (recent treatmenf to [facility]. Vaginosis w Flagyl ). No h/o cardiac disease or stroke.  -admit for observation - Tele bed. Check orthostatic - Will consider echocardiogram Carotid ultrasound  IVF  Lactic Acid  Lipid panel  Continue PPI  Continue Compazine for dizziness  Leukocytosis, likely reactive, but could be  related to underlying infection. WBC 14  CXR NAD   Afebrile. UA neg for nitrites  Blood cultures -  Repeat CBC in AM  History of Migraines Continue Tylenol as needed  Anxiety/ Depression Continue Ativan prn     DVT prophylaxis: Lovenox Code Status:   Full  Family Communication:  Discussed with [name] [name]  Disposition Plan: Expect patient to be discharged to home after condition improves Consults called:    None Admission status:Tele  Obs     Arkeem Harts E, PA-C [facility]   If 7PM-7AM, please contact night-coverage [email or web address] Password [email or web address]  05/29/2016, 3:59 PM

## 2016-05-29 NOTE — ED Notes (Signed)
Patient complains of chest tightness and dizziness since am, no other associated symptoms. denies trauma

## 2016-05-29 NOTE — Progress Notes (Signed)
Patient arrived in the unit accompanied by NT and family members via stretcher. Orientation to the unit given. Patient verbalizes understanding. 

## 2016-05-29 NOTE — ED Notes (Signed)
MD in to see patient and discuss headache

## 2016-05-29 NOTE — ED Notes (Signed)
Patient specifically requests PO meds.

## 2016-05-29 NOTE — Progress Notes (Signed)
Pt informed me that she feels better and wants to go home. I spoke at length with her to explain why she was admitted and plan of care. Pt signed AMA form and left hosp with husband. Md paged

## 2016-05-29 NOTE — ED Provider Notes (Signed)
CSN: [id number]     Arrival date & time 05/29/16  [id number] History   First MD Initiated Contact with Patient 05/29/16 [phone]     Chief Complaint  Patient presents with  . Chest Pain  . Loss of Consciousness   HPI  Patient presents after an episode of syncope. Notably, the patient has multiple medical issues, and has been seen at this and another facility twice within the past 48 hours. I evaluated the patient yesterday, she was recommended to stay for observation, monitoring, but elected to go home from the emergency department. Today the patient presents with episode of syncope that occurred just prior to ED arrival. She notes that she had chest tightness, dizziness, diaphoresis, and lost consciousness. Also consciousness was brief, without substantial trauma. Since that time she has had mild sternal chest tightness, as well as headache, diffuse right-sided. This headache is typical headache for her and she knows a history of migraines. No medication taken for pain relief. No confusion, disorientation, hallucinations. His last weight is important if patient yesterday presented to our affiliated facility after an episode of atypical behavior, possible hallucinations.   Past Medical History  Diagnosis Date  . Sickle cell trait (HCC)   . Pregnant   . Hypertension     Hx PIH with 2002 preg, no current problems  . Depression     PTSD (1997) major depression (2013) - no meds  . Migraine     otc med prn   Past Surgical History  Procedure Laterality Date  . Cesarean section      x 6  . Wisdom tooth extraction    . Cesarean section with bilateral tubal ligation Bilateral 03/02/2014    Procedure: REPEAT CESAREAN SECTION WITH BILATERAL TUBAL LIGATION;  Surgeon: Allie BossierMyra C Dove, MD;  Location: [facility];  Service: Obstetrics;  Laterality: Bilateral;  . Tubal ligation     Family History  Problem Relation Age of Onset  . Hypertension Mother   . Hypertension Father   . Diabetes Father    Social  History  Substance Use Topics  . Smoking status: Never Smoker   . Smokeless tobacco: Never Used  . Alcohol Use: No     Comment: ocassionaly before pregnancy   OB History    Gravida Para Term Preterm AB TAB SAB Ectopic Multiple Living   7 6 6  1  1   6      Review of Systems  Constitutional:       Per HPI, otherwise negative  HENT:       Per HPI, otherwise negative  Respiratory:       Per HPI, otherwise negative  Cardiovascular:       Per HPI, otherwise negative  Gastrointestinal: Negative for vomiting.  Endocrine:       Negative aside from HPI  Genitourinary:       Neg aside from HPI   Musculoskeletal:       Per HPI, otherwise negative  Skin: Negative.   Neurological: Positive for syncope.  Psychiatric/Behavioral: Positive for sleep disturbance. Negative for suicidal ideas. The patient is nervous/anxious.       Allergies  Fish allergy and Penicillins  Home Medications   Prior to Admission medications   Medication Sig Start Date End Date Taking? Authorizing Provider  HYDROcodone-acetaminophen (NORCO/VICODIN) 5-325 MG tablet Take 1 tablet by mouth every 6 (six) hours as needed for severe pain. 05/25/16  Yes Danelle BerryLeisa Tapia, PA-C  ibuprofen (ADVIL,MOTRIN) 800 MG tablet Take 1 tablet (800 mg  total) by mouth 3 (three) times daily. 05/25/16  Yes Danelle Berry, PA-C  metroNIDAZOLE (FLAGYL) 500 MG tablet Take 1 tablet (500 mg total) by mouth 2 (two) times daily. 05/25/16  Yes Danelle Berry, PA-C  omeprazole (PRILOSEC) 20 MG capsule Take 1 capsule (20 mg total) by mouth 2 (two) times daily. 05/28/16  Yes Geoffery Lyons, MD  ondansetron (ZOFRAN ODT) 4 MG disintegrating tablet Take 1 tablet (4 mg total) by mouth every 8 (eight) hours as needed for nausea. 05/25/16  Yes Leisa Tapia, PA-C   BP 136/87 mmHg  Pulse 79  Temp(Src) 98.4 F (36.9 C) (Oral)  Resp 21  Ht  (1.626 m)  Wt 180 lb (81.647 kg)  BMI 30.88 kg/m2  SpO2 98%  LMP 03/25/2016 Physical Exam  Constitutional: She appears  well-developed and well-nourished. No distress.  HENT:  Head: Normocephalic and atraumatic.  Eyes: Conjunctivae and EOM are normal.  Cardiovascular: Normal rate and regular rhythm.   Pulmonary/Chest: Effort normal and breath sounds normal. No stridor. No respiratory distress.  Abdominal: She exhibits no distension.  Musculoskeletal: She exhibits no edema.  Neurological: She is alert. She displays no atrophy and no tremor. No cranial nerve deficit or sensory deficit. She exhibits normal muscle tone. She displays no seizure activity. Coordination normal.  Skin: Skin is warm and dry.  Psychiatric: Her mood appears anxious. Cognition and memory are not impaired.  Nursing note and vitals reviewed.   ED Course  Procedures (including critical care time) Labs Review Labs Reviewed  BASIC METABOLIC PANEL - Abnormal; Notable for the following:    Glucose, Bld 102 (*)    All other components within normal limits  CBC - Abnormal; Notable for the following:    WBC 14.4 (*)    Hemoglobin 11.7 (*)    HCT 35.8 (*)    MCH 25.7 (*)    All other components within normal limits  Rosezena Sensor, ED    Imaging Review Dg Chest 2 View  05/29/2016  CLINICAL DATA:  Headache and chest tightness for 2 day EXAM: CHEST  2 VIEW COMPARISON:  01/01/2016 FINDINGS: Upper normal heart size. Clear lungs. No pneumothorax. No pleural effusion. Stable scoliosis. IMPRESSION: No active cardiopulmonary disease. Electronically Signed   By: Jolaine Click M.D.   On: 05/29/2016 10:58   Ct Head Wo Contrast  05/29/2016  CLINICAL DATA:  Dizziness EXAM: CT HEAD WITHOUT CONTRAST TECHNIQUE: Contiguous axial images were obtained from the base of the skull through the vertex without intravenous contrast. COMPARISON:  03/15/2012 FINDINGS: Brain parenchyma, ventricular system, and extra-axial space are within normal limits. No mass effect, midline shift, or acute hemorrhage. Cranium is intact. The left maxillary sinus is opacified.  IMPRESSION: No acute intracranial pathology. Electronically Signed   By: Jolaine Click M.D.   On: 05/29/2016 11:14   I have personally reviewed and evaluated these images and lab results as part of my medical decision-making.   EKG with sinus rhythm, occasional PVC, rate 85, nonspecific T wave changes, otherwise unremarkable  Update: Chest pain resolved.  Patient still has mild headache. Given unusual circumstances of the loss of consciousness, headache, chest pain, patient will be admitted. MDM  Young female presents to the emergency department for the third time in 3 days. Today the patient is concern for syncope, chest pain, headache. Patient is awake and alert. However, given the description of pain preceding loss of consciousness, as well as the unusual headache, patient had evaluation for cardiogenic, neurogenic etiology for syncope. Initial  studies reassuring, though the patient did have persistent headache as well as mild leukocytosis per No evidence for meningitis, nor neurologic dysfunction. With concern for sicca become a possible arrhythmia, patient admitted for further evaluation, management.  Gerhard Munch, MD 05/29/16 [phone]

## 2016-05-29 NOTE — ED Notes (Signed)
Patient refused prochlorperazine because she is fearful of meds through the IV. Patient also stated she took tylenol with codeine she had with her for her headache. MD made aware.

## 2016-06-02 ENCOUNTER — Ambulatory Visit (HOSPITAL_COMMUNITY)
Admission: EM | Admit: 2016-06-02 | Discharge: 2016-06-02 | Disposition: A | Discharge status: Other Acute Inpatient Hospital | Payer: MEDICAID

## 2016-07-02 ENCOUNTER — Ambulatory Visit (HOSPITAL_COMMUNITY)
Admission: EM | Admit: 2016-07-02 | Discharge: 2016-07-02 | Disposition: A | Discharge status: Home / Self Care | Attending: Family Medicine | Admitting: Family Medicine

## 2016-07-02 ENCOUNTER — Encounter (HOSPITAL_COMMUNITY): Payer: Self-pay | Admitting: *Deleted

## 2016-07-02 DIAGNOSIS — G43109 Migraine with aura, not intractable, without status migrainosus: Secondary | ICD-10-CM | POA: Diagnosis not present

## 2016-07-02 MED ORDER — KETOROLAC TROMETHAMINE 30 MG/ML IJ SOLN
INTRAMUSCULAR | Status: AC
  Filled 2016-07-02: qty 1

## 2016-07-02 MED ORDER — ONDANSETRON 4 MG PO TBDP
4.0000 mg | ORAL_TABLET | Freq: Once | ORAL | Status: AC
  Administered 2016-07-02: 4 mg via ORAL

## 2016-07-02 MED ORDER — RIZATRIPTAN BENZOATE 10 MG PO TABS: 10.0000 mg | ORAL_TABLET | ORAL | Status: DC | PRN

## 2016-07-02 MED ORDER — KETOROLAC TROMETHAMINE 30 MG/ML IJ SOLN: 30.0000 mg | Freq: Once | INTRAMUSCULAR | Status: DC

## 2016-07-02 MED ORDER — ONDANSETRON 4 MG PO TBDP
ORAL_TABLET | ORAL | Status: AC
  Filled 2016-07-02: qty 1

## 2016-07-02 NOTE — ED Provider Notes (Signed)
CSN: [id number]     Arrival date & time 07/02/16  1306 History   First MD Initiated Contact with Patient 07/02/16 1354     Chief Complaint  Patient presents with  . Headache   (Consider location/radiation/quality/duration/timing/severity/associated sxs/prior Treatment) Patient is a 35 y.o. female presenting with headaches. The history is provided by the patient and the spouse.  Headache Pain location:  R parietal Quality:  Dull Radiates to:  Does not radiate Onset quality:  Gradual Duration:  2 weeks Progression:  Unchanged Chronicity:  Chronic Similar to prior headaches: yes   Context: emotional stress   Worsened by:  Nothing Ineffective treatments:  None tried Associated symptoms: no blurred vision, no dizziness, no eye pain, no fever, no focal weakness, no loss of balance, no nausea, no numbness, no paresthesias, no photophobia, no seizures, no syncope, no vomiting and no weakness     Past Medical History  Diagnosis Date  . Sickle cell trait (HCC)   . Pregnant   . Hypertension     Hx PIH with 2002 preg, no current problems  . Depression     PTSD (1997) major depression (2013) - no meds  . Migraine     otc med prn   Past Surgical History  Procedure Laterality Date  . Cesarean section      x 6  . Wisdom tooth extraction    . Cesarean section with bilateral tubal ligation Bilateral 03/02/2014    Procedure: REPEAT CESAREAN SECTION WITH BILATERAL TUBAL LIGATION;  Surgeon: Allie Bossier, MD;  Location: [facility];  Service: Obstetrics;  Laterality: Bilateral;  . Tubal ligation     Family History  Problem Relation Age of Onset  . Hypertension Mother   . Hypertension Father   . Diabetes Father    Social History  Substance Use Topics  . Smoking status: Never Smoker   . Smokeless tobacco: Never Used  . Alcohol Use: No     Comment: ocassionaly before pregnancy   OB History    Gravida Para Term Preterm AB TAB SAB Ectopic Multiple Living   Review of  Systems  Constitutional: Negative.  Negative for fever.  Eyes: Negative for blurred vision, photophobia and pain.  Cardiovascular: Negative for syncope.  Gastrointestinal: Negative for nausea and vomiting.  Neurological: Positive for headaches. Negative for dizziness, focal weakness, seizures, syncope, weakness, numbness, paresthesias and loss of balance.  All other systems reviewed and are negative.   Allergies  Fish allergy and Penicillins  Home Medications   Prior to Admission medications   Medication Sig Start Date End Date Taking? Authorizing Provider  HYDROcodone-acetaminophen (NORCO/VICODIN) 5-325 MG tablet Take 1 tablet by mouth every 6 (six) hours as needed for severe pain. 05/25/16   Danelle Berry, PA-C  ibuprofen (ADVIL,MOTRIN) 800 MG tablet Take 1 tablet (800 mg total) by mouth 3 (three) times daily. 05/25/16   Danelle Berry, PA-C  metroNIDAZOLE (FLAGYL) 500 MG tablet Take 1 tablet (500 mg total) by mouth 2 (two) times daily. 05/25/16   Danelle Berry, PA-C  omeprazole (PRILOSEC) 20 MG capsule Take 1 capsule (20 mg total) by mouth 2 (two) times daily. 05/28/16   Geoffery Lyons, MD  ondansetron (ZOFRAN ODT) 4 MG disintegrating tablet Take 1 tablet (4 mg total) by mouth every 8 (eight) hours as needed for nausea. 05/25/16   Danelle Berry, PA-C  rizatriptan (MAXALT) 10 MG tablet Take 1 tablet (10 mg total) by  mouth as needed for migraine. May repeat in 2 hours if needed 07/02/16   Linna HoffJames D Angelissa Supan, MD   Meds Ordered and Administered this Visit   Medications  ketorolac (TORADOL) 30 MG/ML injection 30 mg (not administered)  ondansetron (ZOFRAN-ODT) disintegrating tablet 4 mg (not administered)    BP 120/84 mmHg  Pulse 80  Temp(Src) 99.5 F (37.5 C) (Oral)  Resp 16  SpO2 96%  LMP 06/11/2016 No data found.   Physical Exam  Constitutional: She is oriented to person, place, and time. She appears well-developed and well-nourished. No distress.  HENT:  Head: Normocephalic.  Right Ear:  External ear normal.  Left Ear: External ear normal.  Mouth/Throat: Oropharynx is clear and moist.  Eyes: Conjunctivae and EOM are normal. Pupils are equal, round, and reactive to light.  Neck: Normal range of motion. Neck supple.  Cardiovascular: Regular rhythm and normal heart sounds.   Pulmonary/Chest: Breath sounds normal.  Lymphadenopathy:    She has no cervical adenopathy.  Neurological: She is alert and oriented to person, place, and time. No cranial nerve deficit. Coordination normal.  Skin: Skin is warm and dry.  Nursing note and vitals reviewed.   ED Course  Procedures (including critical care time)  Labs Review Labs Reviewed - No data to display  Imaging Review No results found.   Visual Acuity Review  Right Eye Distance:   Left Eye Distance:   Bilateral Distance:    Right Eye Near:   Left Eye Near:    Bilateral Near:         MDM   1. Migraine equivalent syndrome        Linna HoffJames D Bradyn Vassey, MD 07/02/16 [phone]

## 2016-07-02 NOTE — ED Notes (Signed)
Pt  Refused  im  toradol   Said it  Gave  Her chills

## 2016-07-02 NOTE — ED Notes (Signed)
Pt  Reports  A  Headache          That  She  Has  Had  For  Quite  A  While  Worse  Over  The  Last sev   Weeks  Worse  Last  Pm          she  Was  Seen  At er  And  Was sent to  [facility]   - she has  An  appt   With a  Neurologist       In   About  A  Month        She  Is  Sitting upright on  The exam table  In no  Distress  Speaking in  Complete  sentances  And  Is  In no    Acute

## 2016-07-17 ENCOUNTER — Emergency Department (HOSPITAL_COMMUNITY)
Admission: EM | Admit: 2016-07-17 | Discharge: 2016-07-17 | Discharge status: Against Medical Advice | Attending: Emergency Medicine | Admitting: Emergency Medicine

## 2016-07-17 ENCOUNTER — Emergency Department (HOSPITAL_COMMUNITY)

## 2016-07-17 ENCOUNTER — Encounter (HOSPITAL_COMMUNITY): Payer: Self-pay | Admitting: Emergency Medicine

## 2016-07-17 DIAGNOSIS — R791 Abnormal coagulation profile: Secondary | ICD-10-CM | POA: Insufficient documentation

## 2016-07-17 DIAGNOSIS — I10 Essential (primary) hypertension: Secondary | ICD-10-CM | POA: Insufficient documentation

## 2016-07-17 DIAGNOSIS — G4489 Other headache syndrome: Secondary | ICD-10-CM | POA: Insufficient documentation

## 2016-07-17 DIAGNOSIS — Z79899 Other long term (current) drug therapy: Secondary | ICD-10-CM | POA: Diagnosis not present

## 2016-07-17 DIAGNOSIS — R531 Weakness: Secondary | ICD-10-CM | POA: Diagnosis present

## 2016-07-17 DIAGNOSIS — R200 Anesthesia of skin: Secondary | ICD-10-CM

## 2016-07-17 DIAGNOSIS — R202 Paresthesia of skin: Secondary | ICD-10-CM

## 2016-07-17 LAB — DIFFERENTIAL
Basophils Absolute: 0 10*3/uL (ref 0.0–0.1)
Basophils Relative: 0 %
EOS PCT: 2 %
Eosinophils Absolute: 0.2 10*3/uL (ref 0.0–0.7)
LYMPHS ABS: 2.6 10*3/uL (ref 0.7–4.0)
LYMPHS PCT: 30 %
MONO ABS: 0.7 10*3/uL (ref 0.1–1.0)
MONOS PCT: 8 %
NEUTROS ABS: 5.3 10*3/uL (ref 1.7–7.7)
Neutrophils Relative %: 61 %

## 2016-07-17 LAB — COMPREHENSIVE METABOLIC PANEL
ALK PHOS: 54 U/L (ref 38–126)
ALT: 15 U/L (ref 14–54)
ANION GAP: 8 (ref 5–15)
AST: 18 U/L (ref 15–41)
Albumin: 3.8 g/dL (ref 3.5–5.0)
BILIRUBIN TOTAL: 0.3 mg/dL (ref 0.3–1.2)
BUN: 12 mg/dL (ref 6–20)
CALCIUM: 9.5 mg/dL (ref 8.9–10.3)
CO2: 26 mmol/L (ref 22–32)
CREATININE: 0.87 mg/dL (ref 0.44–1.00)
Chloride: 105 mmol/L (ref 101–111)
Glucose, Bld: 105 mg/dL — ABNORMAL HIGH (ref 65–99)
Potassium: 4.2 mmol/L (ref 3.5–5.1)
Sodium: 139 mmol/L (ref 135–145)
TOTAL PROTEIN: 7.8 g/dL (ref 6.5–8.1)

## 2016-07-17 LAB — I-STAT TROPONIN, ED: TROPONIN I, POC: 0 ng/mL (ref 0.00–0.08)

## 2016-07-17 LAB — APTT: APTT: 29 s (ref 24–36)

## 2016-07-17 LAB — CBC
HEMATOCRIT: 38.5 % (ref 36.0–46.0)
Hemoglobin: 12.8 g/dL (ref 12.0–15.0)
MCH: 26.9 pg (ref 26.0–34.0)
MCHC: 33.2 g/dL (ref 30.0–36.0)
MCV: 81.1 fL (ref 78.0–100.0)
Platelets: 315 10*3/uL (ref 150–400)
RBC: 4.75 MIL/uL (ref 3.87–5.11)
RDW: 14.1 % (ref 11.5–15.5)
WBC: 8.7 10*3/uL (ref 4.0–10.5)

## 2016-07-17 LAB — I-STAT CHEM 8, ED
BUN: 14 mg/dL (ref 6–20)
CALCIUM ION: 1.16 mmol/L (ref 1.13–1.30)
CREATININE: 0.9 mg/dL (ref 0.44–1.00)
Chloride: 103 mmol/L (ref 101–111)
GLUCOSE: 103 mg/dL — AB (ref 65–99)
HCT: 41 % (ref 36.0–46.0)
HEMOGLOBIN: 13.9 g/dL (ref 12.0–15.0)
POTASSIUM: 4.2 mmol/L (ref 3.5–5.1)
Sodium: 142 mmol/L (ref 135–145)
TCO2: 26 mmol/L (ref 0–100)

## 2016-07-17 LAB — PROTIME-INR
INR: 0.97
PROTHROMBIN TIME: 12.9 s (ref 11.4–15.2)

## 2016-07-17 LAB — CBG MONITORING, ED: Glucose-Capillary: 93 mg/dL (ref 65–99)

## 2016-07-17 MED ORDER — DIPHENHYDRAMINE HCL 50 MG/ML IJ SOLN
25.0000 mg | Freq: Once | INTRAMUSCULAR | Status: DC
  Filled 2016-07-17: qty 1

## 2016-07-17 MED ORDER — PROCHLORPERAZINE EDISYLATE 5 MG/ML IJ SOLN
10.0000 mg | Freq: Once | INTRAMUSCULAR | Status: DC
  Filled 2016-07-17: qty 2

## 2016-07-17 NOTE — ED Provider Notes (Signed)
[facility]-EMERGENCY DEPT Provider Note   CSN: [id number] Arrival date & time: 07/17/16  0110  First Provider Contact:  First MD Initiated Contact with Patient 07/17/16 0133        History   Chief Complaint Chief Complaint  Patient presents with  . Code Stroke    HPI [name] [name] is a 35 y.o. female.  [name] [name] is a 35 y.o. female  with a hx of chronic migraine headache, HTN, depression, sickle cell trait presents to the Emergency Department complaining of gradual, persistent, progressively worsening headache onset 11:30pm tonight.  Pt described the pain as burning in the back of her head.  Associated symptoms include nausea, vomiting, right arm and leg numbness and weakness.  No treatments PTA.  No aggravating or alleviating factors.  Pt denies previous symptoms like this.  No anticoagulants.  Pt denies vision changes, slurred speech.  No witnessed seizure or syncope.      The history is provided by the patient and the EMS personnel. No language interpreter was used.    Past Medical History:  Diagnosis Date  . Depression    PTSD (1997) major depression (2013) - no meds  . Hypertension    Hx PIH with 2002 preg, no current problems  . Migraine    otc med prn  . Pregnant   . Sickle cell trait [facility])     Patient Active Problem List   Diagnosis Date Noted  . Syncope 05/29/2016  . Chest pain 05/29/2016  . Leukocytosis 05/29/2016  . Depression 04/15/2014  . H/O tubal ligation 04/15/2014  . Obesity 04/15/2014  . S/P C-section 03/02/2014  . 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 Sickle cell trait (HCC) 10/01/2013  . Previous cesarean delivery, antepartum condition or complication 09/25/2013    Past Surgical History:  Procedure Laterality Date  . CESAREAN SECTION     x 6  . CESAREAN SECTION WITH BILATERAL TUBAL LIGATION Bilateral 03/02/2014   Procedure: REPEAT CESAREAN SECTION WITH BILATERAL TUBAL LIGATION;  Surgeon: Allie Bossier, MD;  Location: [facility];   Service: Obstetrics;  Laterality: Bilateral;  . TUBAL LIGATION    . WISDOM TOOTH EXTRACTION      OB History    Gravida Para Term Preterm AB Living   7 6 6   1 6    SAB TAB Ectopic Multiple Live Births   1       6       Home Medications    Prior to Admission medications   Medication Sig Start Date End Date Taking? Authorizing Provider  HYDROcodone-acetaminophen (NORCO/VICODIN) 5-325 MG tablet Take 1 tablet by mouth every 6 (six) hours as needed for severe pain. 05/25/16   Danelle Berry, PA-C  ibuprofen (ADVIL,MOTRIN) 800 MG tablet Take 1 tablet (800 mg total) by mouth 3 (three) times daily. 05/25/16   Danelle Berry, PA-C  metroNIDAZOLE (FLAGYL) 500 MG tablet Take 1 tablet (500 mg total) by mouth 2 (two) times daily. 05/25/16   Danelle Berry, PA-C  omeprazole (PRILOSEC) 20 MG capsule Take 1 capsule (20 mg total) by mouth 2 (two) times daily. 05/28/16   Geoffery Lyons, MD  ondansetron (ZOFRAN ODT) 4 MG disintegrating tablet Take 1 tablet (4 mg total) by mouth every 8 (eight) hours as needed for nausea. 05/25/16   Danelle Berry, PA-C  rizatriptan (MAXALT) 10 MG tablet Take 1 tablet (10 mg total) by mouth as needed for migraine. May repeat in 2 hours if needed 07/02/16   Linna Hoff, MD  Family History Family History  Problem Relation Age of Onset  . Hypertension Mother   . Hypertension Father   . Diabetes Father     Social History Social History  Substance Use Topics  . Smoking status: Never Smoker  . Smokeless tobacco: Never Used  . Alcohol use No     Comment: ocassionaly before pregnancy     Allergies   Fish allergy and Penicillins   Review of Systems Review of Systems  Gastrointestinal: Positive for nausea and vomiting.  Neurological: Positive for weakness (right arm and leg), numbness and headaches. Negative for seizures and syncope.  All other systems reviewed and are negative.    Physical Exam Updated Vital Signs BP 131/78   Pulse 91   Resp (!) 33   Ht 5\' 3"  (1.6 m)    Wt 92.8 kg   LMP 06/11/2016   SpO2 100%   BMI 36.24 kg/m   Physical Exam  Constitutional: She appears well-developed and well-nourished. No distress.  Awake, alert  HENT:  Head: Normocephalic and atraumatic.  Mouth/Throat: Oropharynx is clear and moist. No oropharyngeal exudate.  Eyes: Conjunctivae and EOM are normal. Pupils are equal, round, and reactive to light. No scleral icterus.  Neck: Normal range of motion. Neck supple.  Cardiovascular: Normal rate, regular rhythm, normal heart sounds and intact distal pulses.   Pulses:      Radial pulses are 2+ on the right side, and 2+ on the left side.       Dorsalis pedis pulses are 2+ on the right side, and 2+ on the left side.  Pulmonary/Chest: Effort normal and breath sounds normal. No respiratory distress. She has no wheezes.  Equal chest expansion  Abdominal: Soft. Normal appearance and bowel sounds are normal. She exhibits no mass. There is no tenderness. There is no rigidity, no rebound and no guarding.  Musculoskeletal: Normal range of motion. She exhibits no edema.  Neurological: She is alert. She displays no tremor. A sensory deficit (reports decreased sensation in the right arm and leg to normal touch) is present. No cranial nerve deficit. She displays no seizure activity. GCS eye subscore is 4. GCS verbal subscore is 5. GCS motor subscore is 6.  Normal tone.  Strength 5/5 in the LUE and LLE.  RUE 4/5 with minimal pronator drift.  Pt unable to lift right leg off the bed, RLE 4/5 with dorsiflexion and plantar flexion.  Normal finger to nose bilaterally; normal heal shin on the left, unable to perform on the right Speech is clear and goal oriented; no aphasia  Able to follow 2 step commands without difficulty.  Cranial Nerves:  II:  Peripheral visual fields grossly normal, pupils equal, round, reactive to light III,IV, VI: ptosis not present, extra-ocular motions intact bilaterally  V,VII: smile symmetric, facial light touch  sensation equal VIII: hearing grossly normal to voice  X: uvula elevates symmetrically  XI: bilateral shoulder shrug symmetric and strong XII: midline tongue extension without fassiculations Motor:  Gait not tested.   Skin: Skin is warm and dry. She is not diaphoretic.  Psychiatric: Her mood appears anxious.  Nursing note and vitals reviewed.    ED Treatments / Results  Labs (all labs ordered are listed, but only abnormal results are displayed) Labs Reviewed  COMPREHENSIVE METABOLIC PANEL - Abnormal; Notable for the following:       Result Value   Glucose, Bld 105 (*)    All other components within normal limits  I-STAT CHEM 8, ED - Abnormal;  Notable for the following:    Glucose, Bld 103 (*)    All other components within normal limits  PROTIME-INR  APTT  CBC  DIFFERENTIAL  I-STAT TROPOININ, ED  CBG MONITORING, ED    EKG  EKG Interpretation None       Radiology Ct Head Code Stroke W/o Cm  Result Date: 07/17/2016 CLINICAL DATA:  Code stroke. Acute onset RIGHT-sided weakness at work, last seen normal at 0100 hours. History of sickle cell trait, hypertension, migraines. EXAM: CT HEAD WITHOUT CONTRAST TECHNIQUE: Contiguous axial images were obtained from the base of the skull through the vertex without intravenous contrast. COMPARISON:  CT HEAD May 29, 2016 FINDINGS: INTRACRANIAL CONTENTS: The ventricles and sulci are normal. No intraparenchymal hemorrhage, mass effect nor midline shift. No acute large vascular territory infarcts. No abnormal extra-axial fluid collections. Basal cisterns are patent. ORBITS: The included ocular globes and orbital contents are normal. SINUSES: Soft tissue opacifies the mildly explant of the LEFT maxillary sinus with mild wall thickening, suspicious for mucocele, incompletely imaged. SKULL/SOFT TISSUES: No skull fracture. No significant soft tissue swelling. ASPECTS [facility] Stroke Program Early CT Score, http://[email or web address].com) -  Ganglionic level infarction (caudate, lentiform nuclei, internal capsule, insula, M1-M3 cortex): 7 - Supraganglionic infarction (M4-M6 cortex): 3 Total score (0-10 with 10 being normal): 10 IMPRESSION: 1. Negative CT HEAD. 2. ASPECTS score 10. Acute findings discussed with and reconfirmed by Dr.Kirkpatrick on 07/17/2016 at 1:30 am. Electronically Signed   By: Awilda Metro M.D.   On: 07/17/2016 01:33    Procedures Angiocath insertion Date/Time: 07/17/2016 1:52 AM Performed by: Dierdre Forth Authorized by: Dierdre Forth  Consent: The procedure was performed in an emergent situation. Verbal consent obtained. Risks and benefits: risks, benefits and alternatives were discussed Consent given by: patient Site marked: the operative site was marked Required items: required blood products, implants, devices, and special equipment available Patient identity confirmed: verbally with patient Time out: Immediately prior to procedure a "time out" was called to verify the correct patient, procedure, equipment, support staff and site/side marked as required. Preparation: Patient was prepped and draped in the usual sterile fashion. Local anesthesia used: no  Anesthesia: Local anesthesia used: no  Sedation: Patient sedated: no Patient tolerance: Patient tolerated the procedure well with no immediate complications Comments:  Angiocath insertion Performed by: Dierdre Forth  Consent: Verbal consent obtained. Risks and benefits: risks, benefits and alternatives were discussed Time out: Immediately prior to procedure a "time out" was called to verify the correct patient, procedure, equipment, support staff and site/side marked as required.  Preparation: Patient was prepped and draped in the usual sterile fashion.  Vein Location: L AC  Ultrasound Guided  Gauge: 20ga  Normal blood return and flush without difficulty Patient tolerance: Patient tolerated the procedure well with no  immediate complications.      (including critical care time)  Medications Ordered in ED Medications  prochlorperazine (COMPAZINE) injection 10 mg (10 mg Intravenous Not Given 07/17/16 0233)  diphenhydrAMINE (BENADRYL) injection 25 mg (25 mg Intravenous Not Given 07/17/16 0233)     Initial Impression / Assessment and Plan / ED Course  I have reviewed the triage vital signs and the nursing notes.  Pertinent labs & imaging results that were available during my care of the patient were reviewed by me and considered in my medical decision making (see chart for details).  Clinical Course  Value Comment By Time   Pt evaluated by Dr. Amada Jupiter.  He recommends migraine cocktail and MRI.  If  ssx improve and MRI WNL, pt may be d/c home.   Dierdre Forth, PA-C 08/05 0133  Glucose-Capillary: 93 WNL Lanya Bucks, PA-C 08/05 0134  CT Head Code Stroke W/O CM Neg CT head Dierdre Forth, PA-C 08/05 0201   Pt reports taking Rizatriptan in the ED without telling any of the staff.  She continues to refuse the compazine and benadryl.  She reports her headache is resolved along with all her other symptoms.  Pt was taken to MRI but became claustrophobic and was brought back to the ED.  She continues to refuse all IV medications.   Dahlia Client Zanita Millman, PA-C 08/05 0404   Pt is refusing all further treatment.  She reports that she knows this is not a stroke and that the MRI is a waste of time.  Her symptoms have resolved here in the ED.  She is A&Ox4.  She demonstrates competency and capacity to make this decision.   Dahlia Client Bastion Bolger, PA-C 08/05 0409   Pt ambulates without difficulty or assistance in the ED.   Dierdre Forth, PA-C 08/05 [phone]   Pt presents as code stroke.  Head CT without hemorrhagic stroke.  Pt refusing all medications and MRI. She ambulates in the hall without difficulty.    We discussed the nature and purpose, risks and benefits, as well as, the alternatives of treatment.  Time was given to allow the opportunity to ask questions and consider their options, and after the discussion, the patient decided to refuse the offerred treatment (including medications and MRI). The patient was informed that refusal could lead to, but was not limited to, death, permanent disability, or severe pain. I asked her significant others to dissuade them without success. Prior to refusing, I determined that the patient had the capacity to make their decision and understood the consequences of that decision. After refusal, I made every reasonable opportunity to treat them to the best of my ability.  The patient was notified that they may return to the emergency department at any time for further treatment.      Final Clinical Impressions(s) / ED Diagnoses   Final diagnoses:  Right sided weakness  Other headache syndrome    New Prescriptions New Prescriptions   No medications on file     Dierdre Forth, PA-C 07/17/16 0458    Derwood Kaplan, MD 07/17/16 [phone]

## 2016-07-17 NOTE — Consult Note (Signed)
Neurology Consultation Reason for Consult: Right-sided weakness Referring Physician: Rhunette Croft, A  CC: Right-sided weakness  History is obtained from: Patient  HPI: [name] [name] is a 35 y.o. female with a history of migraines presents with headache started in the right occipital region and then began burning. She states that following this she began having right-sided weakness and numbness. She denies pins and needles. The symptoms have been static since onset. She now complains of photophobia as well and has had some nausea also. She describes a single previous episode that was similar, associated with syncope.   LKW: 12:10 AM tpa given?: no, mild symptoms    ROS: A 14 point ROS was performed and is negative except as noted in the HPI.    Past Medical History:  Diagnosis Date  . Depression    PTSD (1997) major depression (2013) - no meds  . Hypertension    Hx PIH with 2002 preg, no current problems  . Migraine    otc med prn  . Pregnant   . Sickle cell trait (HCC)      Family History  Problem Relation Age of Onset  . Hypertension Mother   . Hypertension Father   . Diabetes Father      Social History:  reports that she has never smoked. She has never used smokeless tobacco. She reports that she does not drink alcohol or use drugs.   Exam: Current vital signs: BP 131/78   Pulse 91   Resp (!) 33   Ht  (1.6 m)   Wt 92.8 kg (204 lb 9.4 oz)   LMP 06/11/2016   SpO2 100%   BMI 36.24 kg/m  Vital signs in last 24 hours: Pulse Rate:  [91] 91 (08/05 0215) Resp:  [33] 33 (08/05 0215) BP: (131)/(78) 131/78 (08/05 0215) SpO2:  [100 %] 100 % (08/05 0215) Weight:  [92.8 kg (204 lb 9.4 oz)] 92.8 kg (204 lb 9.4 oz) (08/05 0143)   Physical Exam  Constitutional: Appears well-developed and well-nourished.  Psych: Affect appropriate to situation Eyes: No scleral injection HENT: No OP obstrucion Head: Normocephalic.  Cardiovascular: Normal rate and regular rhythm.   Respiratory: Effort normal and breath sounds normal to anterior ascultation GI: Soft.  No distension. There is no tenderness.  Skin: WDI  Neuro: Mental Status: Patient is awake, alert, oriented to person, place, month, year, and situation. Patient is able to give a clear and coherent history. No signs of aphasia or neglect Cranial Nerves: II: Visual Fields are full. Pupils are equal, round, and reactive to light.   III,IV, VI: EOMI without ptosis or diploplia.  V: Facial sensation is symmetric to temperature VII: Facial movement is symmetric.  VIII: hearing is intact to voice X: Uvula elevates symmetrically XI: Shoulder shrug is symmetric. XII: tongue is midline without atrophy or fasciculations.  Motor: Tone is normal. Bulk is normal. 5/5 strength was present in all four extremities.  Sensory: Sensation is decreased in the right arm and leg Cerebellar: FNF and HKS are intact bilaterally   I have reviewed labs in epic and the results pertinent to this consultation are: CMP-unremarkable  I have reviewed the images obtained: CT head-negative  Impression: 35 year old female with right posterior headache associated with photophobia and right-sided weakness. My suspicion is that this represents competent migraine, however she will need to have ischemia ruled out as noted etiology. An MRI of the brain would be helpful for this.  Recommendations: 1) MRI brain, if negative then would treat as  complex migraine   Ritta Slot, MD [facility] [phone]  If 7pm- 7am, please page neurology on call as listed in AMION.

## 2016-07-17 NOTE — Progress Notes (Signed)
Code Stroke called on 35 y.o female, history of migraines, HTN, depression, presents with acute onset at 0010 of nausea vomiting severe occipital head ache and right sided weakness, numbness, and tingling. Pt taken to CT scan STAT, negative per Neurologist Dr. Amada Jupiter. NIHSS completed yielding 0, some weakness noted in right leg but able to hold for 10 seconds. Pt does also complain of mild photophobia. Per Dr. Amada Jupiter symptoms possibly related to migraine headache. For MRI STAT tonight.

## 2016-07-17 NOTE — ED Triage Notes (Signed)
Pt arrives by [facility] for code stroke. Pt had a headache at work that started burning in back of her head followed by right sided numbness and weakness. Pt refused IV placement. CBG 120. Last vitals 122/88, P 90, 99% RA.

## 2016-07-17 NOTE — Discharge Instructions (Signed)
1. Medications: usual home medications 2. Treatment: rest, drink plenty of fluids,  3. Follow Up: Please followup with your primary doctor in 2 days for discussion of your diagnoses and further evaluation after today's visit; if you do not have a primary care doctor use the resource guide provided to find one; Please return to the ER for return or worsening of symptoms

## 2016-07-17 NOTE — ED Notes (Signed)
Patient transported to MRI 

## 2016-07-20 ENCOUNTER — Ambulatory Visit (INDEPENDENT_AMBULATORY_CARE_PROVIDER_SITE_OTHER): Admitting: Neurology

## 2016-07-20 ENCOUNTER — Encounter: Payer: Self-pay | Admitting: Neurology

## 2016-07-20 VITALS — BP 132/80 | HR 101 | Ht 63.0 in | Wt 208.0 lb

## 2016-07-20 DIAGNOSIS — G43709 Chronic migraine without aura, not intractable, without status migrainosus: Secondary | ICD-10-CM

## 2016-07-20 DIAGNOSIS — R404 Transient alteration of awareness: Secondary | ICD-10-CM | POA: Diagnosis not present

## 2016-07-20 MED ORDER — MAXALT 10 MG PO TABS: 10.0000 mg | ORAL_TABLET | ORAL | 2 refills | Status: DC | PRN

## 2016-07-20 MED ORDER — VENLAFAXINE HCL ER 75 MG PO CP24: 75.0000 mg | ORAL_CAPSULE | Freq: Every day | ORAL | 0 refills | Status: DC

## 2016-07-20 NOTE — Progress Notes (Signed)
NEUROLOGY CONSULTATION NOTE  [name] [name] MRN: [id number] DOB: 25-Oct-1981  Referring provider: ED referral Primary care provider: no  Reason for consult:  migraine  HISTORY OF PRESENT ILLNESS: [name] [name] is a 35 year old right-handed woman with sickle cell trait, hypertension, depression and migraines who presents for headache.  History obtained by patient, her husband, and hospital and ED notes.  Onset:  Since childhood Location:  Varies but usually right posterior region Quality:  pounding Intensity:  10/10 Aura:  no Prodrome:  no Associated symptoms:  Photophobia, phonophobia, blurred vision Duration:  All day Frequency:  5 days per week Triggers/exacerbating factors:  stress Relieving factors:  Maxalt  Past NSAIDS:  no Past analgesics:  Fioricet, Excedrin Migraine, Percocet Past abortive triptans:  sumatriptan 50mg  tablet (denies she ever took) Past muscle relaxants:  Flexeril, Robaxin Past anti-emetic:  Reglan 10mg , promethazine 25mg , Zofran ODT 4mg  Past sleep aide:  Ambien Past antihypertensive medications:  no Past antidepressant medications:  Cymbalta, fluoxetine, sertraline Past anticonvulsant medications:  no  Current NSAIDS:  ibuprofen 800mg  Current analgesics:  Tylenol Current triptans:  Maxalt 10mg  (does not take at earliest onset) Current anti-emetic:  no Current muscle relaxants:  no Current anti-anxiolytic:  no Current sleep aide:  no Current Antihypertensive medications:  no Current Antidepressant medications:  no Current Anticonvulsant medications:  no Current Vitamins/Herbal/Supplements:  no Current Antihistamines/Decongestants:  no Other therapy:  no Other medication:  no  Caffeine:  daily Alcohol:  occasionally Smoker:  no Diet:  Does not drink water Exercise:  no Depression/stress:  History of depression.  Participates in group therapy Sleep hygiene:  good Family history of headache:  No.  No personal or family history of  seizures.  She has multiple visits to the ED for various complaints, including migraines, which she has a known history.  She reports history of "tension headaches", which she describes as burning on the crown of her head associated with confusion.  First event occurred in 2015.  She had two events in 2017.  On 05/28/16, she presented to the ED with hematemesis.  She later had a migraine in the ED.  Blood pressure was stable.  Hgb was 12.  She was discharged but later returned to the ED with confusion.  She woke up from sleep startled and did not recognize her baby or husband who were in front of her.  She denied being married or having children.  She never had similar episode before.  She also had chest pain and had passed out, unwitnessed.  In the ED, she had transient hypotension of 71/46, which rebounded, which could have been error.  She was admitted for observation but she left against medical advice.  Troponins were negative.  CT of head was personally reviewed and was normal.  WBC was 14.4 but CXR and UA were normal and she was afebrile.  PAST MEDICAL HISTORY: Past Medical History:  Diagnosis Date  . Depression    PTSD (1997) major depression (2013) - no meds  . Hypertension    Hx PIH with 2002 preg, no current problems  . Migraine    otc med prn  . Pregnant   . Sickle cell trait (HCC)     PAST SURGICAL HISTORY: Past Surgical History:  Procedure Laterality Date  . CESAREAN SECTION     x 6  . CESAREAN SECTION WITH BILATERAL TUBAL LIGATION Bilateral 03/02/2014   Procedure: REPEAT CESAREAN SECTION WITH BILATERAL TUBAL LIGATION;  Surgeon: Allie Bossier, MD;  Location: [facility]  ORS;  Service: Obstetrics;  Laterality: Bilateral;  . TUBAL LIGATION    . WISDOM TOOTH EXTRACTION      MEDICATIONS: Current Outpatient Prescriptions on File Prior to Visit  Medication Sig Dispense Refill  . [DISCONTINUED] diphenhydrAMINE (BENADRYL) 25 mg capsule Take 1 capsule (25 mg total) by mouth every 8 (eight)  hours as needed. (Patient not taking: Reported on 01/01/2016) 30 capsule 0   No current facility-administered medications on file prior to visit.     ALLERGIES: Allergies  Allergen Reactions  . Fish Allergy Anaphylaxis and Rash    Any kind of seafood  . Penicillins Swelling, Rash and Hives    Pt. Says Amoxicillin is ok. Has patient had a PCN reaction causing immediate rash, facial/tongue/throat swelling, SOB or lightheadedness with hypotension: YES Has patient had a PCN reaction causing severe rash involving mucus membranes or skin necrosis: NO Has patient had a PCN reaction that required hospitalization NO Has patient had a PCN reaction occurring within the last 10 years: NO If all of the above answers are "NO", then may proceed with Cephalosporin use.    FAMILY HISTORY: Family History  Problem Relation Age of Onset  . Hypertension Mother   . Hypertension Father   . Diabetes Father     SOCIAL HISTORY: Social History   Social History  . Marital status: Married    Spouse name: N/A  . Number of children: N/A  . Years of education: N/A   Occupational History  . Not on file.   Social History Main Topics  . Smoking status: Never Smoker  . Smokeless tobacco: Never Used  . Alcohol use No     Comment: ocassionaly before pregnancy  . Drug use: No  . Sexual activity: Yes    Birth control/ protection: Surgical   Other Topics Concern  . Not on file   Social History Narrative   ** Merged History Encounter **        REVIEW OF SYSTEMS: Constitutional: No fevers, chills, or sweats, no generalized fatigue, change in appetite Eyes: No visual changes, double vision, eye pain Ear, nose and throat: No hearing loss, ear pain, nasal congestion, sore throat Cardiovascular: No chest pain, palpitations Respiratory:  No shortness of breath at rest or with exertion, wheezes GastrointestinaI: No nausea, vomiting, diarrhea, abdominal pain, fecal incontinence Genitourinary:  No dysuria,  urinary retention or frequency Musculoskeletal:  No neck pain, back pain Integumentary: No rash, pruritus, skin lesions Neurological: as above Psychiatric: depression, anxiety Endocrine: No palpitations, fatigue, diaphoresis, mood swings, change in appetite, change in weight, increased thirst Hematologic/Lymphatic:  No purpura, petechiae. Allergic/Immunologic: no itchy/runny eyes, nasal congestion, recent allergic reactions, rashes  PHYSICAL EXAM: Vitals:   07/20/16 1459  BP: 132/80  Pulse: (!) 101   General: No acute distress.  Patient appears well-groomed.  Head:  Normocephalic/atraumatic Eyes:  fundi examined but not visualized Neck: supple, no paraspinal tenderness, full range of motion Back: No paraspinal tenderness Heart: regular rate and rhythm Lungs: Clear to auscultation bilaterally. Vascular: No carotid bruits. Neurological Exam: Mental status: alert and oriented to person, place, and time, recent and remote memory intact, fund of knowledge intact, attention and concentration intact, speech fluent and not dysarthric, language intact. Cranial nerves: CN I: not tested CN II: pupils equal, round and reactive to light, visual fields intact CN III, IV, VI:  full range of motion, no nystagmus, no ptosis CN V: facial sensation intact CN VII: upper and lower face symmetric CN VIII: hearing intact CN IX,  X: gag intact, uvula midline CN XI: sternocleidomastoid and trapezius muscles intact CN XII: tongue midline Bulk & Tone: normal, no fasciculations. Motor:  5/5 throughout  Sensation: temperature and vibration sensation intact. Deep Tendon Reflexes:  2+ throughout, toes downgoing.  Finger to nose testing:  Without dysmetria.  Heel to shin:  Without dysmetria.  Gait:  Normal station and stride.  Able to turn and tandem walk. Romberg negative.  IMPRESSION: Chronic migraine without aura Transient altered awareness.  Unusual presentation which suggests psychiatric  etiology.  PLAN: 1.  Will start venalfaxine XR  daily.  She will contact us in 4 weeks with update. 2.  Stop Advil and Tylenol.  Take Maxalt at earliest onset of headache.  Limit to no more than 2 days out of the week. 3.  Lifestyle modification discussed 4.  Will check EEG 5.  Follow up in 3 months.  Thank you for allowing me to take part in the care of this patient.  Shon Millet, DO

## 2016-07-20 NOTE — Patient Instructions (Addendum)
Migraine Recommendations: 1.  Start venlafaxine XR 75mg  daily.  Call in 4 weeks with update and we can adjust dose if needed. 2.  Take Maxalt 10mg  at EARLIEST onset of headache.  May repeat dose once in 2 hours if needed.  Do not exceed two tablets in 24 hours. 3.  Stop Tylenol and Advil.  Limit use of pain relievers to no more than 2 days out of the week.  These medications include acetaminophen, ibuprofen, triptans and narcotics.  This will help reduce risk of rebound headaches. 4.  Be aware of common food triggers such as processed sweets, processed foods with nitrites (such as deli meat, hot dogs, sausages), foods with MSG, alcohol (such as wine), chocolate, certain cheeses, certain fruits (dried fruits, some citrus fruit), vinegar, diet soda. 4.  Avoid caffeine 5.  Routine exercise 6.  Proper sleep hygiene 7.  Stay adequately hydrated with water 8.  Keep a headache diary. 9.  Maintain proper stress management. 10.  Do not skip meals. 11.  Consider supplements:  Magnesium oxide 400mg  to 600mg  daily, riboflavin 400mg , Coenzyme Q 10 100mg  three times daily 12.  Will get EEG 13.  Follow up in 3 months but contact us in 4 weeks with update.

## 2016-07-29 ENCOUNTER — Telehealth: Payer: Self-pay | Admitting: Neurology

## 2016-07-29 NOTE — Telephone Encounter (Signed)
Called pt to confirm EEG appointment 08/02/16.  She asked about her Maxalt refill she was waiting to hear back from us. She said she went to get it filled and the med was not covered unless it was generic the pharmacy was to call us, in the meantime she went back to the pharmacy and they said they had not heard back from us. I told her I will ask Dr. Moises BloodJaffe's nurse and get back to her. I called back and relayed his nurse said the holdup is due to waiting on prior approval because pt stated the generic did not work so we are working on approval. Pt said ok I will call the insurance company to follow up.

## 2016-08-02 ENCOUNTER — Other Ambulatory Visit: Payer: Self-pay

## 2016-08-18 ENCOUNTER — Emergency Department (HOSPITAL_COMMUNITY)
Admission: EM | Admit: 2016-08-18 | Discharge: 2016-08-18 | Disposition: A | Discharge status: Home / Self Care | Attending: Dermatology | Admitting: Dermatology

## 2016-08-18 ENCOUNTER — Encounter (HOSPITAL_COMMUNITY): Payer: Self-pay | Admitting: Emergency Medicine

## 2016-08-18 ENCOUNTER — Ambulatory Visit (HOSPITAL_COMMUNITY): Admission: EM | Admit: 2016-08-18 | Discharge: 2016-08-18 | Discharge status: Against Medical Advice

## 2016-08-18 DIAGNOSIS — R55 Syncope and collapse: Secondary | ICD-10-CM | POA: Insufficient documentation

## 2016-08-18 DIAGNOSIS — I10 Essential (primary) hypertension: Secondary | ICD-10-CM | POA: Insufficient documentation

## 2016-08-18 DIAGNOSIS — Z5321 Procedure and treatment not carried out due to patient leaving prior to being seen by health care provider: Secondary | ICD-10-CM | POA: Insufficient documentation

## 2016-08-18 DIAGNOSIS — H9201 Otalgia, right ear: Secondary | ICD-10-CM | POA: Insufficient documentation

## 2016-08-18 NOTE — ED Triage Notes (Signed)
Pt did not answer.

## 2016-08-18 NOTE — ED Triage Notes (Signed)
Pt here for right ear pain and near syncope yesterday

## 2016-08-18 NOTE — ED Notes (Addendum)
Pt called reassessment multiple times Nurse first aware

## 2016-08-18 NOTE — ED Notes (Signed)
No answer when called for room 

## 2016-10-11 ENCOUNTER — Emergency Department (HOSPITAL_COMMUNITY): Payer: Self-pay

## 2016-10-11 ENCOUNTER — Encounter (HOSPITAL_COMMUNITY): Payer: Self-pay

## 2016-10-11 ENCOUNTER — Emergency Department (HOSPITAL_COMMUNITY)
Admission: EM | Admit: 2016-10-11 | Discharge: 2016-10-12 | Disposition: A | Discharge status: Home / Self Care | Payer: Self-pay | Attending: Emergency Medicine | Admitting: Emergency Medicine

## 2016-10-11 DIAGNOSIS — W19XXXA Unspecified fall, initial encounter: Secondary | ICD-10-CM | POA: Insufficient documentation

## 2016-10-11 DIAGNOSIS — R55 Syncope and collapse: Secondary | ICD-10-CM | POA: Insufficient documentation

## 2016-10-11 DIAGNOSIS — Y999 Unspecified external cause status: Secondary | ICD-10-CM | POA: Insufficient documentation

## 2016-10-11 DIAGNOSIS — Z79899 Other long term (current) drug therapy: Secondary | ICD-10-CM | POA: Insufficient documentation

## 2016-10-11 DIAGNOSIS — M25571 Pain in right ankle and joints of right foot: Secondary | ICD-10-CM | POA: Insufficient documentation

## 2016-10-11 DIAGNOSIS — Y939 Activity, unspecified: Secondary | ICD-10-CM | POA: Insufficient documentation

## 2016-10-11 DIAGNOSIS — Y92129 Unspecified place in nursing home as the place of occurrence of the external cause: Secondary | ICD-10-CM | POA: Insufficient documentation

## 2016-10-11 DIAGNOSIS — M79604 Pain in right leg: Secondary | ICD-10-CM | POA: Insufficient documentation

## 2016-10-11 DIAGNOSIS — I10 Essential (primary) hypertension: Secondary | ICD-10-CM | POA: Insufficient documentation

## 2016-10-11 LAB — POC URINE PREG, ED: PREG TEST UR: NEGATIVE

## 2016-10-11 MED ORDER — ACETAMINOPHEN 500 MG PO TABS
1000.0000 mg | ORAL_TABLET | Freq: Once | ORAL | Status: AC
  Administered 2016-10-12: 1000 mg via ORAL
  Filled 2016-10-11: qty 2

## 2016-10-11 NOTE — ED Notes (Signed)
Pt reports she gets "chronic migraines, sometimes gets dizzy which is what she thinks happened today.  She took motrin and reports head is now a 4 stating "it does not hurt anymore"

## 2016-10-11 NOTE — ED Provider Notes (Signed)
[facility]-EMERGENCY DEPT Provider Note   CSN: [id number] Arrival date & time: 10/11/16  1418     History   Chief Complaint Chief Complaint  Patient presents with  . syncope-right side pain    HPI [name] JesterRegina [name] is a 35 y.o. female.  The history is provided by the patient.  Loss of Consciousness   This is a new problem. The current episode started yesterday. Episode frequency: once. She lost consciousness for a period of less than one minute. The problem is associated with normal activity. Pertinent negatives include abdominal pain, back pain, bladder incontinence, bowel incontinence, chest pain, confusion, congestion, diaphoresis, fever, headaches, light-headedness, malaise/fatigue, nausea, palpitations, seizures, slurred speech, vertigo and vomiting. She has tried nothing for the symptoms.   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 She is unsure if she synopsized and then fell or if she slipped, fell, and lost consciousness. No recurrence of syncopal episode since.   She is complaining of right leg pain following the fall.   Past Medical History:  Diagnosis Date  . Depression    PTSD (1997) major depression (2013) - no meds  . Hypertension    Hx PIH with 2002 preg, no current problems  . Migraine    otc med prn  . Pregnant   . Sickle cell trait [facility](HCC)     Patient Active Problem List   Diagnosis Date Noted  . Syncope 05/29/2016  . Chest pain 05/29/2016  . Leukocytosis 05/29/2016  . Depression 04/15/2014  . H/O tubal ligation 04/15/2014  . Obesity 04/15/2014  . S/P C-section 03/02/2014  . 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 Sickle cell trait (HCC) 10/01/2013  . Previous cesarean delivery, antepartum condition or complication 09/25/2013    Past Surgical History:  Procedure Laterality Date  . CESAREAN SECTION     x 6  . CESAREAN SECTION WITH BILATERAL TUBAL LIGATION Bilateral 03/02/2014   Procedure: REPEAT CESAREAN SECTION  WITH BILATERAL TUBAL LIGATION;  Surgeon: Allie BossierMyra C Dove, MD;  Location: [facility] ORS;  Service: Obstetrics;  Laterality: Bilateral;  . TUBAL LIGATION    . WISDOM TOOTH EXTRACTION      OB History    Gravida Para Term Preterm AB Living   7 6 6   1 6    SAB TAB Ectopic Multiple Live Births   1       6       Home Medications    Prior to Admission medications   Medication Sig Start Date End Date Taking? Authorizing Provider  MAXALT 10 MG tablet Take 1 tablet (10 mg total) by mouth as needed for migraine. May repeat once in 2 hours if needed 07/20/16  Yes Adam Mliss Fritz Jaffe, DO  acetaminophen (TYLENOL) 500 MG tablet Take 2 tablets (1,000 mg total) by mouth every 8 (eight) hours. Do not take more than 4000 mg of acetaminophen (Tylenol) in a 24-hour period. Please note that other medicines that you may be prescribed may have Tylenol as well. 10/12/16 10/17/16  Nira ConnPedro Eduardo Derec Mozingo, MD    Family History Family History  Problem Relation Age of Onset  . Hypertension Mother   . Hypertension Father   . Diabetes Father     Social History Social History  Substance Use Topics  . Smoking status: Never Smoker  . Smokeless tobacco: Never Used  . Alcohol use No     Comment: ocassionaly before pregnancy     Allergies   Fish allergy and Penicillins   Review  of Systems Review of Systems  Constitutional: Negative for chills, diaphoresis, fever and malaise/fatigue.  HENT: Negative for congestion, ear pain and sore throat.   Eyes: Negative for pain and visual disturbance.  Respiratory: Negative for cough and shortness of breath.   Cardiovascular: Positive for syncope. Negative for chest pain and palpitations.  Gastrointestinal: Negative for abdominal pain, bowel incontinence, nausea and vomiting.  Genitourinary: Negative for bladder incontinence, dysuria and hematuria.  Musculoskeletal: Positive for gait problem (due to pain). Negative for arthralgias and back pain.  Skin: Negative for color change and  rash.  Neurological: Positive for syncope. Negative for vertigo, seizures, light-headedness and headaches.  Psychiatric/Behavioral: Negative for confusion.  All other systems reviewed and are negative.    Physical Exam Updated Vital Signs BP 131/94 (BP Location: Left Arm)   Pulse 100   Temp 98.3 F (36.8 C) (Oral)   Resp 18   Ht 5\' 4"  (1.626 m)   Wt 208 lb (94.3 kg)   SpO2 99%   BMI 35.70 kg/m   Physical Exam  Constitutional: She is oriented to person, place, and time. She appears well-developed and well-nourished. No distress.  HENT:  Head: Normocephalic and atraumatic.  Right Ear: External ear normal.  Left Ear: External ear normal.  Nose: Nose normal.  Eyes: Conjunctivae and EOM are normal. Pupils are equal, round, and reactive to light. Right eye exhibits no discharge. Left eye exhibits no discharge. No scleral icterus.  Neck: Normal range of motion. Neck supple.  Cardiovascular: Normal rate, regular rhythm and normal heart sounds.  Exam reveals no gallop and no friction rub.   No murmur heard. Pulses:      Radial pulses are 2+ on the right side, and 2+ on the left side.       Dorsalis pedis pulses are 2+ on the right side, and 2+ on the left side.  Pulmonary/Chest: Effort normal and breath sounds normal. No stridor. No respiratory distress. She has no wheezes.  Abdominal: Soft. She exhibits no distension. There is no tenderness.  Musculoskeletal: She exhibits no edema.       Right ankle: Tenderness.       Cervical back: She exhibits no bony tenderness.       Thoracic back: She exhibits no bony tenderness.       Lumbar back: She exhibits no bony tenderness.       Right lower leg: She exhibits tenderness.  Clavicles stable. Chest stable to AP/Lat compression. Pelvis stable to Lat compression. No obvious extremity deformity.   Neurological: She is alert and oriented to person, place, and time.  Moving all extremities  Skin: Skin is warm and dry. No rash noted. She  is not diaphoretic. No erythema.  Psychiatric: She has a normal mood and affect.     ED Treatments / Results  Labs (all labs ordered are listed, but only abnormal results are displayed) Labs Reviewed  POC URINE PREG, ED    EKG  EKG Interpretation  Date/Time:  Monday October 11 2016 14:49:08 EDT Ventricular Rate:  93 PR Interval:  112 QRS Duration: 90 QT Interval:  352 QTC Calculation: 437 R Axis:   23 Text Interpretation:  Normal sinus rhythm Nonspecific T wave abnormality Reconfirmed by [facility] MD, Jorden Mahl ([id number]) on 10/12/2016 12:16:44 AM       Radiology Dg Tibia/fibula Right  Result Date: 10/11/2016 CLINICAL DATA:  35 y/o F; syncopal episode this afternoon with generalized right lower leg pain. EXAM: RIGHT FOOT COMPLETE - 3+ VIEW; RIGHT  TIBIA AND FIBULA - 2 VIEW; RIGHT ANKLE - COMPLETE 3+ VIEW COMPARISON:  None. FINDINGS: Right foot: There is no evidence of fracture or dislocation. There is no evidence of arthropathy or other focal bone abnormality. Soft tissues are unremarkable. Lisfranc alignment is maintained. Right ankle: There is no evidence of fracture or dislocation. There is no evidence of arthropathy or other focal bone abnormality. Soft tissues are unremarkable. Talar dome is intact. Ankle mortise is symmetric on these nonstress views. Right tibia and fibula: There is no evidence of fracture or dislocation. There is no evidence of arthropathy or other focal bone abnormality. Soft tissues are unremarkable. IMPRESSION: No acute fracture or dislocation identified. Electronically Signed   By: Mitzi HansenLance  Furusawa-Stratton M.D.   On: 10/11/2016 23:13   Dg Ankle Complete Right  Result Date: 10/11/2016 CLINICAL DATA:  35 y/o F; syncopal episode this afternoon with generalized right lower leg pain. EXAM: RIGHT FOOT COMPLETE - 3+ VIEW; RIGHT TIBIA AND FIBULA - 2 VIEW; RIGHT ANKLE - COMPLETE 3+ VIEW COMPARISON:  None. FINDINGS: Right foot: There is no evidence of fracture or  dislocation. There is no evidence of arthropathy or other focal bone abnormality. Soft tissues are unremarkable. Lisfranc alignment is maintained. Right ankle: There is no evidence of fracture or dislocation. There is no evidence of arthropathy or other focal bone abnormality. Soft tissues are unremarkable. Talar dome is intact. Ankle mortise is symmetric on these nonstress views. Right tibia and fibula: There is no evidence of fracture or dislocation. There is no evidence of arthropathy or other focal bone abnormality. Soft tissues are unremarkable. IMPRESSION: No acute fracture or dislocation identified. Electronically Signed   By: Mitzi HansenLance  Furusawa-Stratton M.D.   On: 10/11/2016 23:13   Dg Foot Complete Right  Result Date: 10/11/2016 CLINICAL DATA:  35 y/o F; syncopal episode this afternoon with generalized right lower leg pain. EXAM: RIGHT FOOT COMPLETE - 3+ VIEW; RIGHT TIBIA AND FIBULA - 2 VIEW; RIGHT ANKLE - COMPLETE 3+ VIEW COMPARISON:  None. FINDINGS: Right foot: There is no evidence of fracture or dislocation. There is no evidence of arthropathy or other focal bone abnormality. Soft tissues are unremarkable. Lisfranc alignment is maintained. Right ankle: There is no evidence of fracture or dislocation. There is no evidence of arthropathy or other focal bone abnormality. Soft tissues are unremarkable. Talar dome is intact. Ankle mortise is symmetric on these nonstress views. Right tibia and fibula: There is no evidence of fracture or dislocation. There is no evidence of arthropathy or other focal bone abnormality. Soft tissues are unremarkable. IMPRESSION: No acute fracture or dislocation identified. Electronically Signed   By: Mitzi HansenLance  Furusawa-Stratton M.D.   On: 10/11/2016 23:13    Procedures Procedures (including critical care time)  Medications Ordered in ED Medications  acetaminophen (TYLENOL) tablet 1,000 mg (1,000 mg Oral Given 10/12/16 0019)     Initial Impression / Assessment and Plan  / ED Course  I have reviewed the triage vital signs and the nursing notes.  Pertinent labs & imaging results that were available during my care of the patient were reviewed by me and considered in my medical decision making (see chart for details).  Clinical Course    1. Syncope No h/o GI bleed. No chest pain or SOB. No evidence of volume overload. Not hypotensive. UPT negative. EKG w/o prolonged QT, dysrrhythmias, brugada.   2. Right leg pain. No acute fractures or dislocations on plain film.   Final Clinical Impressions(s) / ED Diagnoses   Final diagnoses:  Fall  Right leg pain  Syncope, unspecified syncope type   Disposition: Discharge  Condition: Good  I have discussed the results, Dx and Tx plan with the patient who expressed understanding and agree(s) with the plan. Discharge instructions discussed at great length. The patient was given strict return precautions who verbalized understanding of the instructions. No further questions at time of discharge.    New Prescriptions   ACETAMINOPHEN (TYLENOL) 500 MG TABLET    Take 2 tablets (1,000 mg total) by mouth every 8 (eight) hours. Do not take more than 4000 mg of acetaminophen (Tylenol) in a 24-hour period. Please note that other medicines that you may be prescribed may have Tylenol as well.    Follow Up: [facility] [address] [place] [address] [phone]        Nira Conn, MD 10/12/16 [phone]

## 2016-10-11 NOTE — ED Triage Notes (Signed)
Patient here with syncopal event this am after getting dizzy while getting up. On arrival alert and oriented, complains of right leg and side pain. NAD.

## 2016-10-12 MED ORDER — ACETAMINOPHEN 500 MG PO TABS: 1000.0000 mg | ORAL_TABLET | Freq: Three times a day (TID) | ORAL | 0 refills | Status: AC

## 2016-10-25 IMAGING — CR DG CHEST 2V
2 series · 2 of 2 positions shown · non-contrast
Comparison: 08/01/2008

CLINICAL DATA: Chest pain for 2 days

EXAM:
CHEST  2 VIEW

[chest pa]
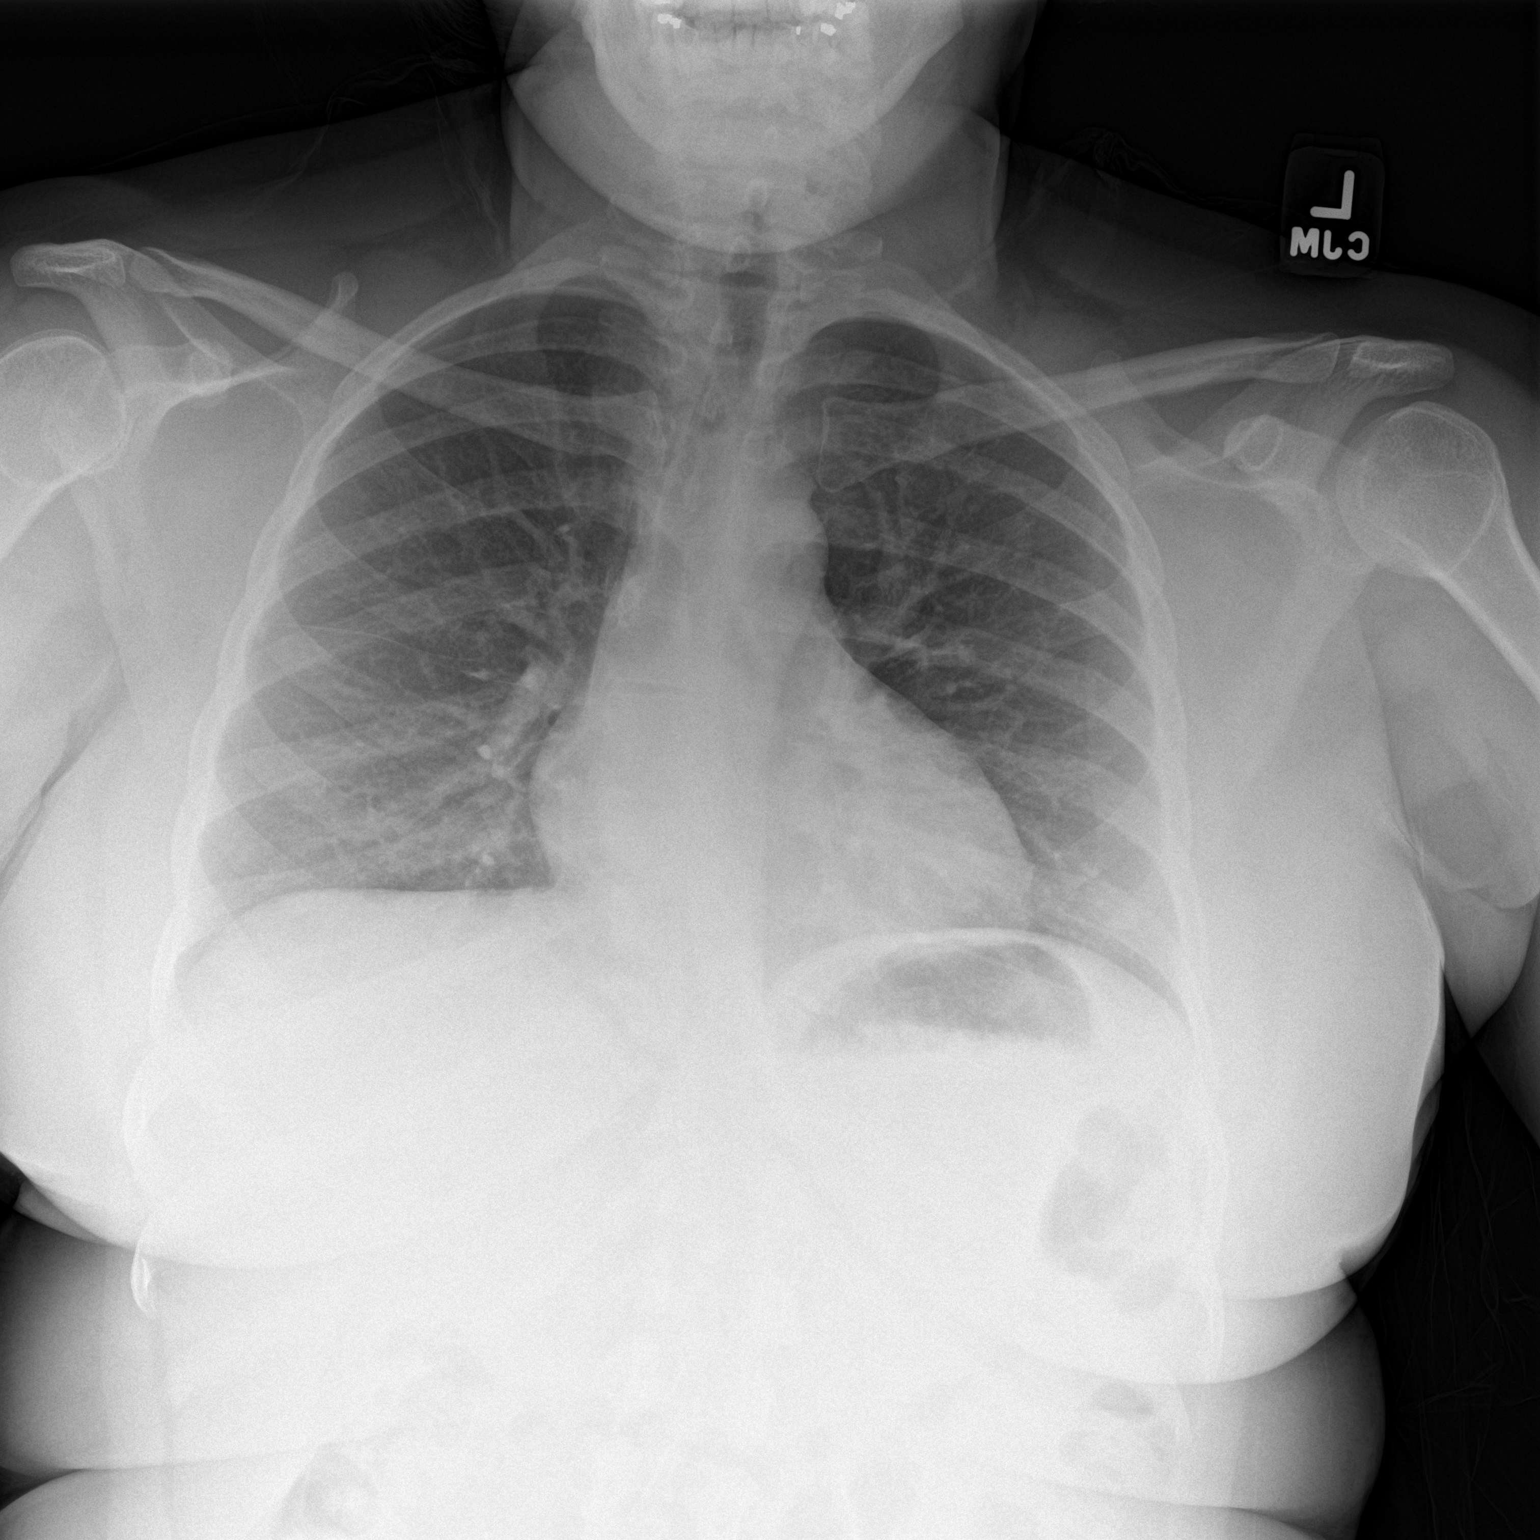

[chest lat]
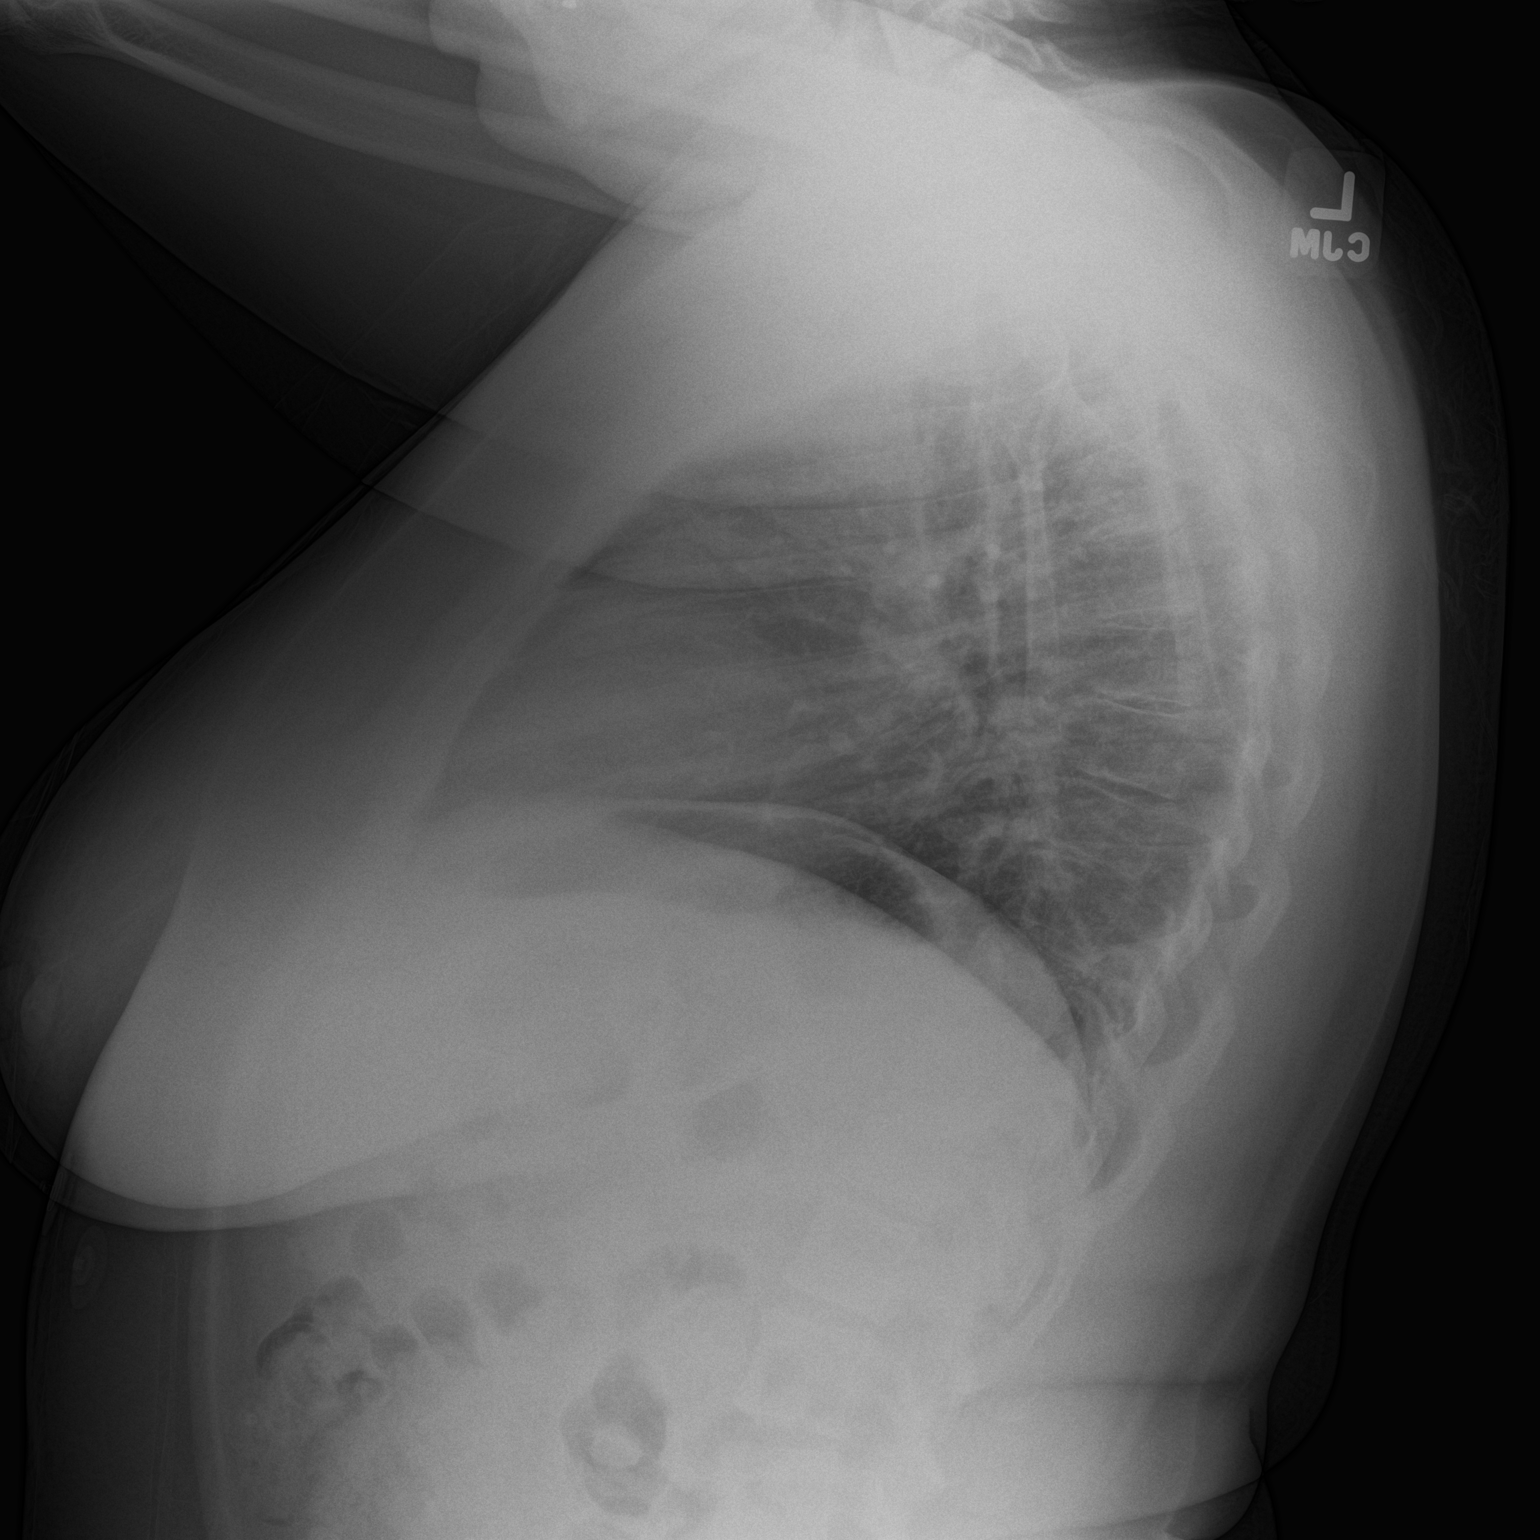

[2 of 2 positions shown; findings below may reference images not displayed]

FINDINGS: The heart size and mediastinal contours are within normal limits.
Both lungs are clear. The visualized skeletal structures are
unremarkable.
IMPRESSION: No active cardiopulmonary disease.

## 2016-10-26 ENCOUNTER — Ambulatory Visit: Payer: Self-pay | Admitting: Neurology

## 2016-11-28 ENCOUNTER — Inpatient Hospital Stay (HOSPITAL_COMMUNITY)
Admission: AD | Admit: 2016-11-28 | Discharge: 2016-11-28 | Discharge status: Against Medical Advice | Source: Ambulatory Visit | Attending: Obstetrics & Gynecology | Admitting: Obstetrics & Gynecology

## 2016-11-28 DIAGNOSIS — Z3202 Encounter for pregnancy test, result negative: Secondary | ICD-10-CM | POA: Insufficient documentation

## 2016-11-28 DIAGNOSIS — N939 Abnormal uterine and vaginal bleeding, unspecified: Secondary | ICD-10-CM

## 2016-11-28 LAB — POCT PREGNANCY, URINE: PREG TEST UR: NEGATIVE

## 2016-11-28 NOTE — MAU Note (Signed)
Migraine this morning.  Still having headache at work and was lifting a patient and started having sharp pains in right mid abd to her right low back since 10:40 pm then started having vaginal bleeding since midnight.  [place] [place] [facility] [place], saw his midwife 2 weeks ago and was told over 4 months pregnant, that was a estimate, next visit is first ultrasound.

## 2016-11-28 NOTE — Progress Notes (Signed)
Back from bathroom, states she is no longer have pain in her side, and wants to go and has called her husband to pick her up.  States she just wants to go home and go to sleep. No bleeding noted at this time. New pad and underwear and MAU paper scrubs given.  Notified Venia CarbonJennifer Rasch NP.

## 2016-11-28 NOTE — MAU Provider Note (Signed)
Attempted to discuss patients reason for visit today. Patient is standing at the foot of the bed stating that she is cold and ready to go. Patient refused lab work. Patient's pregnancy test (urine) today is negative. Patient has a blanket in the sink that has bright red blood smeared on it. Patient refuses further exam today, stating that she is "tired and ready to go."  Results for orders placed or performed during the hospital encounter of 11/28/16 (from the past 48 hour(s))  Pregnancy, urine POC     Status: None   Collection Time: 11/28/16  1:20 AM  Result Value Ref Range   Preg Test, Ur NEGATIVE NEGATIVE    Comment:        THE SENSITIVITY OF THIS METHODOLOGY IS >24 mIU/mL     Duane LopeJennifer I Njeri Vicente, NP 11/28/2016 1:38 AM

## 2016-12-26 ENCOUNTER — Emergency Department (HOSPITAL_COMMUNITY)
Admission: EM | Admit: 2016-12-26 | Discharge: 2016-12-26 | Disposition: A | Discharge status: Home / Self Care | Payer: BLUE CROSS/BLUE SHIELD | Attending: Emergency Medicine | Admitting: Emergency Medicine

## 2016-12-26 ENCOUNTER — Encounter (HOSPITAL_COMMUNITY): Payer: Self-pay

## 2016-12-26 DIAGNOSIS — I10 Essential (primary) hypertension: Secondary | ICD-10-CM | POA: Diagnosis not present

## 2016-12-26 DIAGNOSIS — H6591 Unspecified nonsuppurative otitis media, right ear: Secondary | ICD-10-CM

## 2016-12-26 DIAGNOSIS — H65191 Other acute nonsuppurative otitis media, right ear: Secondary | ICD-10-CM | POA: Diagnosis not present

## 2016-12-26 DIAGNOSIS — H9201 Otalgia, right ear: Secondary | ICD-10-CM | POA: Diagnosis present

## 2016-12-26 MED ORDER — DIPHENHYDRAMINE HCL 25 MG PO CAPS
25.0000 mg | ORAL_CAPSULE | Freq: Once | ORAL | Status: AC
  Administered 2016-12-26: 25 mg via ORAL
  Filled 2016-12-26: qty 1

## 2016-12-26 MED ORDER — CEFDINIR 300 MG PO CAPS: 300.0000 mg | ORAL_CAPSULE | Freq: Two times a day (BID) | ORAL | 0 refills | Status: DC

## 2016-12-26 MED ORDER — TRAMADOL HCL 50 MG PO TABS: 50.0000 mg | ORAL_TABLET | Freq: Four times a day (QID) | ORAL | 0 refills | Status: DC | PRN

## 2016-12-26 MED ORDER — ONDANSETRON 4 MG PO TBDP
4.0000 mg | ORAL_TABLET | Freq: Once | ORAL | Status: AC
  Administered 2016-12-26: 4 mg via ORAL
  Filled 2016-12-26: qty 1

## 2016-12-26 MED ORDER — IBUPROFEN 800 MG PO TABS
800.0000 mg | ORAL_TABLET | Freq: Once | ORAL | Status: AC
  Administered 2016-12-26: 800 mg via ORAL
  Filled 2016-12-26: qty 1

## 2016-12-26 NOTE — ED Triage Notes (Signed)
Patient complains of generalized headache and right sided ear pain x 2 days. Alert and oriented, NAD.

## 2016-12-26 NOTE — ED Provider Notes (Signed)
[facility]-EMERGENCY DEPT Provider Note   CSN: [id number] Arrival date & time: 12/26/16  1025     History   Chief Complaint Chief Complaint  Patient presents with  . headache/ earpain    HPI [name] [name] is a 36 y.o. female.  HPI   She does past medical history of depression, PTSD, migraines, hypertension comes to the emergency department with complaint of right ear pain and headache for 1 week. She reports trying multiple over-the-counter medications which help her headache away. Then it returns. Chest sensitivity to light. She denies photophobia, neck pain, fevers, sore throat, nausea, vomiting, diarrhea. She describes the pain as right parietal and pressure.  Past Medical History:  Diagnosis Date  . Depression    PTSD (1997) major depression (2013) - no meds  . Hypertension    Hx PIH with 2002 preg, no current problems  . Migraine    otc med prn  . Pregnant   . Sickle cell trait [facility](HCC)     Patient Active Problem List   Diagnosis Date Noted  . Syncope 05/29/2016  . Chest pain 05/29/2016  . Leukocytosis 05/29/2016  . Depression 04/15/2014  . H/O tubal ligation 04/15/2014  . Obesity 04/15/2014  . S/P C-section 03/02/2014  . 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 Sickle cell trait (HCC) 10/01/2013  . Previous cesarean delivery, antepartum condition or complication 09/25/2013    Past Surgical History:  Procedure Laterality Date  . CESAREAN SECTION     x 6  . CESAREAN SECTION WITH BILATERAL TUBAL LIGATION Bilateral 03/02/2014   Procedure: REPEAT CESAREAN SECTION WITH BILATERAL TUBAL LIGATION;  Surgeon: Allie BossierMyra C Dove, MD;  Location: [facility];  Service: Obstetrics;  Laterality: Bilateral;  . TUBAL LIGATION    . WISDOM TOOTH EXTRACTION      OB History    Gravida Para Term Preterm AB Living   7 6 6   1 6    SAB TAB Ectopic Multiple Live Births   1       6       Home Medications    Prior to Admission medications   Medication Sig Start Date End Date  Taking? Authorizing Provider  cefdinir (OMNICEF) 300 MG capsule Take 1 capsule (300 mg total) by mouth 2 (two) times daily. 12/26/16   Jawana Reagor Neva SeatGreene, PA-C  MAXALT 10 MG tablet Take 1 tablet (10 mg total) by mouth as needed for migraine. May repeat once in 2 hours if needed 07/20/16   Drema DallasAdam R Jaffe, DO  traMADol (ULTRAM) 50 MG tablet Take 1 tablet (50 mg total) by mouth every 6 (six) hours as needed. 12/26/16   Marlon Peliffany Seva Chancy, PA-C    Family History Family History  Problem Relation Age of Onset  . Hypertension Mother   . Hypertension Father   . Diabetes Father     Social History Social History  Substance Use Topics  . Smoking status: Never Smoker  . Smokeless tobacco: Never Used  . Alcohol use No     Comment: ocassionaly before pregnancy     Allergies   Fish allergy and Penicillins   Review of Systems Review of Systems Review of Systems All other systems negative except as documented in the HPI. All pertinent positives and negatives as reviewed in the HPI.   Physical Exam Updated Vital Signs BP 119/90 (BP Location: Left Arm)   Pulse 86   Temp 98 F (36.7 C) (Oral)   Resp 18   SpO2 99%   Physical Exam  Constitutional: She  appears well-developed and well-nourished. No distress.  HENT:  Head: Normocephalic and atraumatic.  Right Ear: Ear canal normal. Tympanic membrane is injected.  Left Ear: Tympanic membrane and ear canal normal. Tympanic membrane is not injected.  Nose: Nose normal.  Mouth/Throat: Uvula is midline, oropharynx is clear and moist and mucous membranes are normal.  Eyes: Pupils are equal, round, and reactive to light.  Neck: Normal range of motion. Neck supple.  Cardiovascular: Normal rate and regular rhythm.   Pulmonary/Chest: Effort normal.  Abdominal: Soft.  No signs of abdominal distention  Musculoskeletal:  No LE swelling  Neurological: She is alert.  Acting at baseline  Skin: Skin is warm and dry. No rash noted.  Nursing note and vitals  reviewed.  ED Treatments / Results  Labs (all labs ordered are listed, but only abnormal results are displayed) Labs Reviewed - No data to display  EKG  EKG Interpretation None       Radiology No results found.  Procedures Procedures (including critical care time)  Medications Ordered in ED Medications  ondansetron (ZOFRAN-ODT) disintegrating tablet 4 mg (4 mg Oral Given 12/26/16 1247)  diphenhydrAMINE (BENADRYL) capsule 25 mg (25 mg Oral Given 12/26/16 1248)  ibuprofen (ADVIL,MOTRIN) tablet 800 mg (800 mg Oral Given 12/26/16 1247)     Initial Impression / Assessment and Plan / ED Course  I have reviewed the triage vital signs and the nursing notes.  Pertinent labs & imaging results that were available during my care of the patient were reviewed by me and considered in my medical decision making (see chart for details).  Clinical Course     Patient presents with otalgia and exam consistent with acute otitis media. No concern for acute mastoiditis, meningitis.  Patient discharged home with Cefdinir.  Advised patient to follow-up with PCP.  I have also discussed reasons to return immediately to the ER.  Patient expresses understanding and agrees with plan. Pt appears safe for discharge.   Final Clinical Impressions(s) / ED Diagnoses   Final diagnoses:  Other nonsuppurative otitis media of right ear, unspecified chronicity    New Prescriptions New Prescriptions   CEFDINIR (OMNICEF) 300 MG CAPSULE    Take 1 capsule (300 mg total) by mouth 2 (two) times daily.   TRAMADOL (ULTRAM) 50 MG TABLET    Take 1 tablet (50 mg total) by mouth every 6 (six) hours as needed.     Marlon Pel, PA-C 12/26/16 1332    Gerhard Munch, MD 12/26/16 [phone]

## 2016-12-26 NOTE — ED Notes (Signed)
Pt is in stable condition upon d/c and ambulates from ED. 

## 2017-03-02 ENCOUNTER — Ambulatory Visit (INDEPENDENT_AMBULATORY_CARE_PROVIDER_SITE_OTHER): Payer: BLUE CROSS/BLUE SHIELD | Admitting: Physician Assistant

## 2017-03-02 ENCOUNTER — Encounter (INDEPENDENT_AMBULATORY_CARE_PROVIDER_SITE_OTHER): Payer: Self-pay | Admitting: Physician Assistant

## 2017-03-02 ENCOUNTER — Ambulatory Visit (INDEPENDENT_AMBULATORY_CARE_PROVIDER_SITE_OTHER): Payer: Self-pay | Admitting: Physician Assistant

## 2017-03-02 VITALS — BP 125/67 | HR 82 | Temp 98.5°F | Ht 61.81 in | Wt 211.6 lb

## 2017-03-02 DIAGNOSIS — G43009 Migraine without aura, not intractable, without status migrainosus: Secondary | ICD-10-CM | POA: Diagnosis not present

## 2017-03-02 DIAGNOSIS — F418 Other specified anxiety disorders: Secondary | ICD-10-CM

## 2017-03-02 DIAGNOSIS — Z111 Encounter for screening for respiratory tuberculosis: Secondary | ICD-10-CM

## 2017-03-02 DIAGNOSIS — Z0000 Encounter for general adult medical examination without abnormal findings: Secondary | ICD-10-CM | POA: Diagnosis not present

## 2017-03-02 LAB — POCT URINALYSIS DIPSTICK
Bilirubin, UA: NEGATIVE
Blood, UA: NEGATIVE
Glucose, UA: NEGATIVE
Ketones, UA: NEGATIVE
LEUKOCYTES UA: NEGATIVE
NITRITE UA: NEGATIVE
Protein, UA: 30
Spec Grav, UA: 1.015 (ref 1.030–1.035)
UROBILINOGEN UA: 0.2 (ref ?–2.0)
pH, UA: 5.5 (ref 5.0–8.0)

## 2017-03-02 LAB — POCT URINE PREGNANCY: PREG TEST UR: NEGATIVE

## 2017-03-02 MED ORDER — SUMATRIPTAN SUCCINATE 25 MG PO TABS: 25.0000 mg | ORAL_TABLET | Freq: Every day | ORAL | 0 refills | Status: DC

## 2017-03-02 MED ORDER — SERTRALINE HCL 50 MG PO TABS: 50.0000 mg | ORAL_TABLET | Freq: Every day | ORAL | 3 refills | Status: DC

## 2017-03-02 NOTE — Progress Notes (Signed)
Subjective:  Patient ID: [name] Jesteregina Cardy, female    DOB: [birth year]  Age: 36 y.o. MRN: [id number]  CC: annual physical, headaches, depression  HPI [name] [name] is a 36 y.o. female with a PMH of PTSD, depression, and migraine headache presents to have an annual physical done and to address her migraines and depression. PAP done at her OB/GYN 6-8 months ago. In regards to migraines, says she has 3-4 per week. Each episode usually lasts about 24 hrs. Associated photophobia and phonophobia. Relieved with Maxalt and rest. In regards to depression, she has had chronic depression but has not received treatment or therapy. Denies suicidal ideation/intention. Does not endorse any other symptoms.     Outpatient Medications Prior to Visit  Medication Sig Dispense Refill  . cefdinir (OMNICEF) 300 MG capsule Take 1 capsule (300 mg total) by mouth 2 (two) times daily. (Patient not taking: Reported on 03/02/2017) 14 capsule 0  . MAXALT 10 MG tablet Take 1 tablet (10 mg total) by mouth as needed for migraine. May repeat once in 2 hours if needed (Patient not taking: Reported on 03/02/2017) 10 tablet 2  . traMADol (ULTRAM) 50 MG tablet Take 1 tablet (50 mg total) by mouth every 6 (six) hours as needed. (Patient not taking: Reported on 03/02/2017) 10 tablet 0   No facility-administered medications prior to visit.      ROS Review of Systems  Constitutional: Negative for chills, fever and malaise/fatigue.  Eyes: Negative for blurred vision.  Respiratory: Negative for shortness of breath.   Cardiovascular: Negative for chest pain and palpitations.  Gastrointestinal: Negative for abdominal pain and nausea.  Genitourinary: Negative for dysuria and hematuria.  Musculoskeletal: Negative for joint pain and myalgias.  Skin: Negative for rash.  Neurological: Positive for headaches. Negative for tingling.  Psychiatric/Behavioral: Positive for depression. The patient is nervous/anxious.     Objective:  BP 125/67  (BP Location: Right Arm, Patient Position: Sitting, Cuff Size: Large)   Pulse 82   Temp 98.5 F (36.9 C) (Oral)   Ht 5' 1.81" (1.57 m)   Wt 211 lb 9.6 oz (96 kg)   LMP 01/20/2017 (Approximate)   SpO2 94%   BMI 38.94 kg/m   BP/Weight 03/02/2017 12/26/2016 11/28/2016  Systolic BP 125 119 102  Diastolic BP 67 90 63  Wt. (Lbs) 211.6 - -  BMI 38.94 - -      Physical Exam  Constitutional: She is oriented to person, place, and time.  Well developed, obese, NAD, polite  HENT:  Head: Normocephalic and atraumatic.  Eyes: Conjunctivae are normal. Pupils are equal, round, and reactive to light. No scleral icterus.  Neck: Normal range of motion. Neck supple. No thyromegaly present.  Cardiovascular: Normal rate, regular rhythm and normal heart sounds.   Pulmonary/Chest: Effort normal and breath sounds normal. No respiratory distress. She has no wheezes. She has no rales.  Abdominal: Soft. Bowel sounds are normal. She exhibits no distension. There is no tenderness. There is no rebound and no guarding.  Musculoskeletal: She exhibits no edema, tenderness or deformity.  Back with normal aROM.   Lymphadenopathy:    She has no cervical adenopathy.  Neurological: She is alert and oriented to person, place, and time. She has normal reflexes. No cranial nerve deficit. She exhibits normal muscle tone. Coordination normal.  Skin: Skin is warm and dry. No rash noted. She is not diaphoretic. No erythema. No pallor.  Psychiatric: She has a normal mood and affect. Her behavior is normal. Thought content normal.  Vitals reviewed.    Assessment & Plan:   1. Annual physical exam - CBC With Differential - Comprehensive metabolic panel - Lipid Panel - TSH - Urinalysis Dipstick - POCT urine pregnancy - TDAP will be given at later time  2. Migraine without aura and without status migrainosus, not intractable - Begin Sumatriptan  3. Depression with anxiety - sertraline (ZOLOFT) 50 MG tablet; Take 1  tablet (50 mg total) by mouth daily.  Dispense: 30 tablet; Refill: 3 - Ambulatory referral to Psychology   Meds ordered this encounter  Medications  . sertraline (ZOLOFT) 50 MG tablet    Sig: Take 1 tablet (50 mg total) by mouth daily.    Dispense:  30 tablet    Refill:  3    Order Specific Question:   Supervising Provider    Answer:   Quentin Angst [id number]  . SUMAtriptan (IMITREX) 25 MG tablet    Sig: Take 1 tablet (25 mg total) by mouth daily. May take one more tablet two hours after the first. No more than two tablets per day.    Dispense:  30 tablet    Refill:  0    Order Specific Question:   Supervising Provider    Answer:   Quentin Angst [id number]    Follow-up: Return in about 4 weeks (around 03/30/2017) for headache and depression.   Loletta Specter PA

## 2017-03-02 NOTE — Patient Instructions (Signed)
Major Depressive Disorder, Adult Major depressive disorder (MDD) is a mental health condition. It may also be called clinical depression or unipolar depression. MDD usually causes feelings of sadness, hopelessness, or helplessness. MDD can also cause physical symptoms. It can interfere with work, school, relationships, and other everyday activities. MDD may be mild, moderate, or severe. It may occur once (single episode major depressive disorder) or it may occur multiple times (recurrent major depressive disorder). What are the causes? The exact cause of this condition is not known. MDD is most likely caused by a combination of things, which may include:  Genetic factors. These are traits that are passed along from parent to child.  Individual factors. Your personality, your behavior, and the way you handle your thoughts and feelings may contribute to MDD. This includes personality traits and behaviors learned from others.  Physical factors, such as: ? Differences in the part of your brain that controls emotion. This part of your brain may be different than it is in people who do not have MDD. ? Long-term (chronic) medical or psychiatric illnesses.  Social factors. Traumatic experiences or major life changes may play a role in the development of MDD.  What increases the risk? This condition is more likely to develop in women. The following factors may also make you more likely to develop MDD:  A family history of depression.  Troubled family relationships.  Abnormally low levels of certain brain chemicals.  Traumatic events in childhood, especially abuse or the loss of a parent.  Being under a lot of stress, or long-term stress, especially from upsetting life experiences or losses.  A history of: ? Chronic physical illness. ? Other mental health disorders. ? Substance abuse.  Poor living conditions.  Experiencing social exclusion or discrimination on a regular basis.  What are  the signs or symptoms? The main symptoms of MDD typically include:  Constant depressed or irritable mood.  Loss of interest in things and activities.  MDD symptoms may also include:  Sleeping or eating too much or too little.  Unexplained weight change.  Fatigue or low energy.  Feelings of worthlessness or guilt.  Difficulty thinking clearly or making decisions.  Thoughts of suicide or of harming others.  Physical agitation or weakness.  Isolation.  Severe cases of MDD may also occur with other symptoms, such as:  Delusions or hallucinations, in which you imagine things that are not real (psychotic depression).  Low-level depression that lasts at least a year (chronic depression or persistent depressive disorder).  Extreme sadness and hopelessness (melancholic depression).  Trouble speaking and moving (catatonic depression).  How is this diagnosed? This condition may be diagnosed based on:  Your symptoms.  Your medical history, including your mental health history. This may involve tests to evaluate your mental health. You may be asked questions about your lifestyle, including any drug and alcohol use, and how long you have had symptoms of MDD.  A physical exam.  Blood tests to rule out other conditions.  You must have a depressed mood and at least four other MDD symptoms most of the day, nearly every day in the same 2-week timeframe before your health care provider can confirm a diagnosis of MDD. How is this treated? This condition is usually treated by mental health professionals, such as psychologists, psychiatrists, and clinical social workers. You may need more than one type of treatment. Treatment may include:  Psychotherapy. This is also called talk therapy or counseling. Types of psychotherapy include: ? Cognitive behavioral   therapy (CBT). This type of therapy teaches you to recognize unhealthy feelings, thoughts, and behaviors, and replace them with  positive thoughts and actions. ? Interpersonal therapy (IPT). This helps you to improve the way you relate to and communicate with others. ? Family therapy. This treatment includes members of your family.  Medicine to treat anxiety and depression, or to help you control certain emotions and behaviors.  Lifestyle changes, such as: ? Limiting alcohol and drug use. ? Exercising regularly. ? Getting plenty of sleep. ? Making healthy eating choices. ? Spending more time outdoors.  Treatments involving stimulation of the brain can be used in situations with extremely severe symptoms, or when medicine or other therapies do not work over time. These treatments include electroconvulsive therapy, transcranial magnetic stimulation, and vagal nerve stimulation. Follow these instructions at home: Activity  Return to your normal activities as told by your health care provider.  Exercise regularly and spend time outdoors as told by your health care provider. General instructions  Take over-the-counter and prescription medicines only as told by your health care provider.  Do not drink alcohol. If you drink alcohol, limit your alcohol intake to no more than 1 drink a day for nonpregnant women and 2 drinks a day for men. One drink equals 12 oz of beer, 5 oz of wine, or 1 oz of hard liquor. Alcohol can affect any antidepressant medicines you are taking. Talk to your health care provider about your alcohol use.  Eat a healthy diet and get plenty of sleep.  Find activities that you enjoy doing, and make time to do them.  Consider joining a support group. Your health care provider may be able to recommend a support group.  Keep all follow-up visits as told by your health care provider. This is important. Where to find more information: National Alliance on Mental Illness  www.nami.org  U.[place]. National Institute of Mental Health  www.nimh.nih.gov  National Suicide Prevention  Lifeline  1-800-273-TALK (8255). This is free, 24-hour help.  Contact a health care provider if:  Your symptoms get worse.  You develop new symptoms. Get help right away if:  You self-harm.  You have serious thoughts about hurting yourself or others.  You see, hear, taste, smell, or feel things that are not present (hallucinate). This information is not intended to replace advice given to you by your health care provider. Make sure you discuss any questions you have with your health care provider. Document Released: 03/26/2013 Document Revised: 08/05/2016 Document Reviewed: 06/09/2016 Elsevier Interactive Patient Education  2017 Elsevier Inc.  

## 2017-03-03 ENCOUNTER — Ambulatory Visit (INDEPENDENT_AMBULATORY_CARE_PROVIDER_SITE_OTHER): Payer: Self-pay | Admitting: Physician Assistant

## 2017-03-03 LAB — COMPREHENSIVE METABOLIC PANEL
ALBUMIN: 4.3 g/dL (ref 3.5–5.5)
ALK PHOS: 64 IU/L (ref 39–117)
ALT: 12 IU/L (ref 0–32)
AST: 16 IU/L (ref 0–40)
Albumin/Globulin Ratio: 1.2 (ref 1.2–2.2)
BILIRUBIN TOTAL: 0.3 mg/dL (ref 0.0–1.2)
BUN / CREAT RATIO: 12 (ref 9–23)
BUN: 9 mg/dL (ref 6–20)
CHLORIDE: 103 mmol/L (ref 96–106)
CO2: 24 mmol/L (ref 18–29)
Calcium: 9 mg/dL (ref 8.7–10.2)
Creatinine, Ser: 0.77 mg/dL (ref 0.57–1.00)
GFR calc non Af Amer: 100 mL/min/{1.73_m2} (ref >59)
GFR, EST AFRICAN AMERICAN: 116 mL/min/{1.73_m2} (ref >59)
GLOBULIN, TOTAL: 3.6 g/dL (ref 1.5–4.5)
Glucose: 92 mg/dL (ref 65–99)
Potassium: 4.5 mmol/L (ref 3.5–5.2)
SODIUM: 139 mmol/L (ref 134–144)
Total Protein: 7.9 g/dL (ref 6.0–8.5)

## 2017-03-03 LAB — TSH: TSH: 0.399 u[IU]/mL — AB (ref 0.450–4.500)

## 2017-03-03 LAB — CBC WITH DIFFERENTIAL
BASOS: 0 %
Basophils Absolute: 0 10*3/uL (ref 0.0–0.2)
EOS (ABSOLUTE): 0.1 10*3/uL (ref 0.0–0.4)
EOS: 2 %
HEMATOCRIT: 39 % (ref 34.0–46.6)
HEMOGLOBIN: 12.6 g/dL (ref 11.1–15.9)
Immature Grans (Abs): 0 10*3/uL (ref 0.0–0.1)
Immature Granulocytes: 0 %
LYMPHS ABS: 1.8 10*3/uL (ref 0.7–3.1)
Lymphs: 28 %
MCH: 26.1 pg — AB (ref 26.6–33.0)
MCHC: 32.3 g/dL (ref 31.5–35.7)
MCV: 81 fL (ref 79–97)
MONOCYTES: 8 %
Monocytes Absolute: 0.5 10*3/uL (ref 0.1–0.9)
Neutrophils Absolute: 3.9 10*3/uL (ref 1.4–7.0)
Neutrophils: 62 %
RBC: 4.83 x10E6/uL (ref 3.77–5.28)
RDW: 15.3 % (ref 12.3–15.4)
WBC: 6.3 10*3/uL (ref 3.4–10.8)

## 2017-03-03 LAB — LIPID PANEL
CHOLESTEROL TOTAL: 154 mg/dL (ref 100–199)
Chol/HDL Ratio: 3.1 ratio units (ref 0.0–4.4)
HDL: 49 mg/dL (ref >39)
LDL CALC: 91 mg/dL (ref 0–99)
Triglycerides: 69 mg/dL (ref 0–149)
VLDL Cholesterol Cal: 14 mg/dL (ref 5–40)

## 2017-03-04 ENCOUNTER — Encounter (INDEPENDENT_AMBULATORY_CARE_PROVIDER_SITE_OTHER): Payer: Self-pay

## 2017-03-04 ENCOUNTER — Other Ambulatory Visit (INDEPENDENT_AMBULATORY_CARE_PROVIDER_SITE_OTHER): Payer: BLUE CROSS/BLUE SHIELD

## 2017-03-04 LAB — TB SKIN TEST
INDURATION: 0 mm
TB SKIN TEST: NEGATIVE

## 2017-03-04 NOTE — Addendum Note (Signed)
Addended by: Maryjean MornOBERTS, TEMPESTT S on: 03/04/2017 10:35 AM   Modules accepted: Orders

## 2017-03-05 ENCOUNTER — Encounter (HOSPITAL_COMMUNITY): Payer: Self-pay | Admitting: Emergency Medicine

## 2017-03-05 ENCOUNTER — Ambulatory Visit (HOSPITAL_COMMUNITY)
Admission: EM | Admit: 2017-03-05 | Discharge: 2017-03-05 | Disposition: A | Discharge status: Home / Self Care | Payer: BLUE CROSS/BLUE SHIELD | Attending: Internal Medicine | Admitting: Internal Medicine

## 2017-03-05 DIAGNOSIS — R42 Dizziness and giddiness: Secondary | ICD-10-CM

## 2017-03-05 DIAGNOSIS — H6123 Impacted cerumen, bilateral: Secondary | ICD-10-CM

## 2017-03-05 NOTE — ED Triage Notes (Signed)
Here for bilateral ear pain onset 2 weeks associated w/HA, dizziness, "off balance", decreased hearing  Hx of migraines  Denies fevers, chills, cold sx  Last had Tylenol at 0500 today and Imitrex 0200 today w/no relief.   A&O x4... NAD

## 2017-03-05 NOTE — ED Provider Notes (Signed)
CSN: [id number]     Arrival date & time 03/05/17  1200 History   First MD Initiated Contact with Patient 03/05/17 1221     Chief Complaint  Patient presents with  . Otalgia   (Consider location/radiation/quality/duration/timing/severity/associated sxs/prior Treatment) Patient is a 36 y.o. Female, with hx of migraine, HTN, depression, is here mainly for bilateral otalgia and dizziness onset late last night. Patient feels like her ears are stopped up accompany by hearing loss. She has no tinnitus.  Patient also reports that she is off balance during walking and the room to be spinning. She denies hx of vertigo in the past.   She also has a headache 9/10 associated with nausea and phonosensitivity but states that this headache is typical for her. She endorses hx of migraine.  She denies vomiting, photosensitivity. BP is normal at repeat 114/71.          Past Medical History:  Diagnosis Date  . Depression    PTSD (1997) major depression (2013) - no meds  . Hypertension    Hx PIH with 2002 preg, no current problems  . Migraine    otc med prn  . Pregnant   . Sickle cell trait [facility](HCC)    Past Surgical History:  Procedure Laterality Date  . CESAREAN SECTION     x 6  . CESAREAN SECTION WITH BILATERAL TUBAL LIGATION Bilateral 03/02/2014   Procedure: REPEAT CESAREAN SECTION WITH BILATERAL TUBAL LIGATION;  Surgeon: Allie BossierMyra C Dove, MD;  Location: [facility] OR[facility];  Service: Obstetrics;  Laterality: Bilateral;  . TUBAL LIGATION    . WISDOM TOOTH EXTRACTION     Family History  Problem Relation Age of Onset  . Hypertension Mother   . Hypertension Father   . Diabetes Father    Social History  Substance Use Topics  . Smoking status: Never Smoker  . Smokeless tobacco: Never Used  . Alcohol use No     Comment: ocassionaly before pregnancy   OB History    Gravida Para Term Preterm AB Living   7 6 6   1 6    SAB TAB Ectopic Multiple Live Births   1       6     Review of Systems  Constitutional:        As stated in the HPI    Allergies  Fish allergy and Penicillins  Home Medications   Prior to Admission medications   Medication Sig Start Date End Date Taking? Authorizing Provider  SUMAtriptan (IMITREX) 25 MG tablet Take 1 tablet (25 mg total) by mouth daily. May take one more tablet two hours after the first. No more than two tablets per day. 03/02/17  Yes Roger Mariana Arnavid Gomez, PA-C  sertraline (ZOLOFT) 50 MG tablet Take 1 tablet (50 mg total) by mouth daily. 03/02/17   Loletta Specteroger David Gomez, PA-C   Meds Ordered and Administered this Visit  Medications - No data to display  BP (!) 161/79 (BP Location: Right Arm)   Pulse 87   Temp 97.8 F (36.6 C) (Oral)   Resp 16   LMP 01/19/2017   SpO2 98%  No data found.   Physical Exam  Constitutional: She appears well-developed. No distress.  HENT:  Head: Normocephalic and atraumatic.  Bilateral cerumen impaction noted  Eyes: Conjunctivae and EOM are normal. Pupils are equal, round, and reactive to light.  Intact peripheral vision  Neck: Normal range of motion. Neck supple.  Cardiovascular: Normal rate, regular rhythm and normal heart sounds.   Pulmonary/Chest:  Effort normal and breath sounds normal.  Abdominal: Soft. Bowel sounds are normal. She exhibits no distension. There is no tenderness.  Musculoskeletal: Normal range of motion.  Lymphadenopathy:    She has no cervical adenopathy.  Neurological:  Alert and oriented. Cranial nerves are grossly intact. Speech normal. Has good coordination. Heel-to-shin appropriate. Finger-to-nose appropriate. Has negative arm drift or leg drift. Has intact sensation. Has good gait. Had negative romberg. Has good RAM. Has good and equally strong muscle strength.    Skin: Skin is warm and dry. No rash noted.  Nursing note and vitals reviewed.   Urgent Care Course     Procedures (including critical care time)  Labs Review Labs Reviewed - No data to display  Imaging Review No results  found.  MDM   1. Bilateral impacted cerumen   2. Dizziness    1239: Offered migraine cocktail for her headache but patient declined.   Bilateral cerumen impaction resolved after ear irrigation. Patient tolerated procedure well. She reports that she is now able to hear. She reports that her dizziness is now much better. Patient is also neurologically intact; stable to be discharge home. ER precaution discussed.       Lucia Estelle, NP 03/05/17 1332

## 2017-03-07 ENCOUNTER — Ambulatory Visit (INDEPENDENT_AMBULATORY_CARE_PROVIDER_SITE_OTHER): Payer: Self-pay | Admitting: Physician Assistant

## 2017-03-09 ENCOUNTER — Encounter (INDEPENDENT_AMBULATORY_CARE_PROVIDER_SITE_OTHER): Payer: Self-pay | Admitting: Physician Assistant

## 2017-03-09 ENCOUNTER — Ambulatory Visit (INDEPENDENT_AMBULATORY_CARE_PROVIDER_SITE_OTHER): Payer: BLUE CROSS/BLUE SHIELD | Admitting: Physician Assistant

## 2017-03-09 ENCOUNTER — Other Ambulatory Visit (INDEPENDENT_AMBULATORY_CARE_PROVIDER_SITE_OTHER): Payer: Self-pay | Admitting: Physician Assistant

## 2017-03-09 VITALS — BP 137/86 | HR 77 | Temp 98.2°F | Wt 213.6 lb

## 2017-03-09 DIAGNOSIS — H9201 Otalgia, right ear: Secondary | ICD-10-CM | POA: Diagnosis not present

## 2017-03-09 DIAGNOSIS — Z91199 Patient's noncompliance with other medical treatment and regimen due to unspecified reason: Secondary | ICD-10-CM

## 2017-03-09 DIAGNOSIS — Z9119 Patient's noncompliance with other medical treatment and regimen: Secondary | ICD-10-CM | POA: Diagnosis not present

## 2017-03-09 DIAGNOSIS — G43909 Migraine, unspecified, not intractable, without status migrainosus: Secondary | ICD-10-CM

## 2017-03-09 DIAGNOSIS — F418 Other specified anxiety disorders: Secondary | ICD-10-CM

## 2017-03-09 MED ORDER — NEOMYCIN-COLIST-HC-THONZONIUM 3.3-3-10-0.5 MG/ML OT SUSP: 4.0000 [drp] | Freq: Three times a day (TID) | OTIC | 0 refills | Status: AC

## 2017-03-09 MED ORDER — RIZATRIPTAN BENZOATE 10 MG PO TABS: 10.0000 mg | ORAL_TABLET | ORAL | 5 refills | Status: AC | PRN

## 2017-03-09 MED ORDER — ACETIC ACID 2 % OT SOLN: 4.0000 [drp] | Freq: Three times a day (TID) | OTIC | 0 refills | Status: DC

## 2017-03-09 NOTE — Patient Instructions (Signed)
Living With Anxiety After being diagnosed with an anxiety disorder, you may be relieved to know why you have felt or behaved a certain way. It is natural to also feel overwhelmed about the treatment ahead and what it will mean for your life. With care and support, you can manage this condition and recover from it. How to cope with anxiety Dealing with stress  Stress is your body's reaction to life changes and events, both good and bad. Stress can last just a few hours or it can be ongoing. Stress can play a major role in anxiety, so it is important to learn both how to cope with stress and how to think about it differently. Talk with your health care provider or a counselor to learn more about stress reduction. He or she may suggest some stress reduction techniques, such as:  Music therapy. This can include creating or listening to music that you enjoy and that inspires you.  Mindfulness-based meditation. This involves being aware of your normal breaths, rather than trying to control your breathing. It can be done while sitting or walking.  Centering prayer. This is a kind of meditation that involves focusing on a word, phrase, or sacred image that is meaningful to you and that brings you peace.  Deep breathing. To do this, expand your stomach and inhale slowly through your nose. Hold your breath for 3-5 seconds. Then exhale slowly, allowing your stomach muscles to relax.  Self-talk. This is a skill where you identify thought patterns that lead to anxiety reactions and correct those thoughts.  Muscle relaxation. This involves tensing muscles then relaxing them. Choose a stress reduction technique that fits your lifestyle and personality. Stress reduction techniques take time and practice. Set aside 5-15 minutes a day to do them. Therapists can offer training in these techniques. The training may be covered by some insurance plans. Other things you can do to manage stress include:  Keeping a  stress diary. This can help you learn what triggers your stress and ways to control your response.  Thinking about how you respond to certain situations. You may not be able to control everything, but you can control your reaction.  Making time for activities that help you relax, and not feeling guilty about spending your time in this way. Therapy combined with coping and stress-reduction skills provides the best chance for successful treatment. Medicines  Medicines can help ease symptoms. Medicines for anxiety include:  Anti-anxiety drugs.  Antidepressants.  Beta-blockers. Medicines may be used as the main treatment for anxiety disorder, along with therapy, or if other treatments are not working. Medicines should be prescribed by a health care provider. Relationships  Relationships can play a big part in helping you recover. Try to spend more time connecting with trusted friends and family members. Consider going to couples counseling, taking family education classes, or going to family therapy. Therapy can help you and others better understand the condition. How to recognize changes in your condition Everyone has a different response to treatment for anxiety. Recovery from anxiety happens when symptoms decrease and stop interfering with your daily activities at home or work. This may mean that you will start to:  Have better concentration and focus.  Sleep better.  Be less irritable.  Have more energy.  Have improved memory. It is important to recognize when your condition is getting worse. Contact your health care provider if your symptoms interfere with home or work and you do not feel like your condition is  improving. Where to find help and support: You can get help and support from these sources:  Self-help groups.  Online and [employer].  A trusted spiritual leader.  Couples counseling.  Family education classes.  Family therapy. Follow these  instructions at home:  Eat a healthy diet that includes plenty of vegetables, fruits, whole grains, low-fat dairy products, and lean protein. Do not eat a lot of foods that are high in solid fats, added sugars, or salt.  Exercise. Most adults should do the following:  Exercise for at least 150 minutes each week. The exercise should increase your heart rate and make you sweat (moderate-intensity exercise).  Strengthening exercises at least twice a week.  Cut down on caffeine, tobacco, alcohol, and other potentially harmful substances.  Get the right amount and quality of sleep. Most adults need 7-9 hours of sleep each night.  Make choices that simplify your life.  Take over-the-counter and prescription medicines only as told by your health care provider.  Avoid caffeine, alcohol, and certain over-the-counter cold medicines. These may make you feel worse. Ask your pharmacist which medicines to avoid.  Keep all follow-up visits as told by your health care provider. This is important. Questions to ask your health care provider  Would I benefit from therapy?  How often should I follow up with a health care provider?  How long do I need to take medicine?  Are there any long-term side effects of my medicine?  Are there any alternatives to taking medicine? Contact a health care provider if:  You have a hard time staying focused or finishing daily tasks.  You spend many hours a day feeling worried about everyday life.  You become exhausted by worry.  You start to have headaches, feel tense, or have nausea.  You urinate more than normal.  You have diarrhea. Get help right away if:  You have a racing heart and shortness of breath.  You have thoughts of hurting yourself or others. If you ever feel like you may hurt yourself or others, or have thoughts about taking your own life, get help right away. You can go to your nearest emergency department or call:  Your local emergency  services (911 in the [place]  A suicide crisis helpline, such as the National Suicide Prevention Lifeline at 430-724-9716. This is open 24-hours a day. Summary  Taking steps to deal with stress can help calm you.  Medicines cannot cure anxiety disorders, but they can help ease symptoms.  Family, friends, and partners can play a big part in helping you recover from an anxiety disorder. This information is not intended to replace advice given to you by your health care provider. Make sure you discuss any questions you have with your health care provider. Document Released: 11/23/2016 Document Revised: 11/23/2016 Document Reviewed: 11/23/2016 Elsevier Interactive Patient Education  2017 [employer] Major Depressive Disorder, Adult Major depressive disorder (MDD) is a mental health condition. It may also be called clinical depression or unipolar depression. MDD usually causes feelings of sadness, hopelessness, or helplessness. MDD can also cause physical symptoms. It can interfere with work, school, relationships, and other everyday activities. MDD may be mild, moderate, or severe. It may occur once (single episode major depressive disorder) or it may occur multiple times (recurrent major depressive disorder). What are the causes? The exact cause of this condition is not known. MDD is most likely caused by a combination of things, which may include:  Genetic factors. These are traits that are  passed along from parent to child.  Individual factors. Your personality, your behavior, and the way you handle your thoughts and feelings may contribute to MDD. This includes personality traits and behaviors learned from others.  Physical factors, such as:  Differences in the part of your brain that controls emotion. This part of your brain may be different than it is in people who do not have MDD.  Long-term (chronic) medical or psychiatric illnesses.  Social factors. Traumatic experiences or major  life changes may play a role in the development of MDD. What increases the risk? This condition is more likely to develop in women. The following factors may also make you more likely to develop MDD:  A family history of depression.  Troubled family relationships.  Abnormally low levels of certain brain chemicals.  Traumatic events in childhood, especially abuse or the loss of a parent.  Being under a lot of stress, or long-term stress, especially from upsetting life experiences or losses.  A history of:  Chronic physical illness.  Other mental health disorders.  Substance abuse.  Poor living conditions.  Experiencing social exclusion or discrimination on a regular basis. What are the signs or symptoms? The main symptoms of MDD typically include:  Constant depressed or irritable mood.  Loss of interest in things and activities. MDD symptoms may also include:  Sleeping or eating too much or too little.  Unexplained weight change.  Fatigue or low energy.  Feelings of worthlessness or guilt.  Difficulty thinking clearly or making decisions.  Thoughts of suicide or of harming others.  Physical agitation or weakness.  Isolation. Severe cases of MDD may also occur with other symptoms, such as:  Delusions or hallucinations, in which you imagine things that are not real (psychotic depression).  Low-level depression that lasts at least a year (chronic depression or persistent depressive disorder).  Extreme sadness and hopelessness (melancholic depression).  Trouble speaking and moving (catatonic depression). How is this diagnosed? This condition may be diagnosed based on:  Your symptoms.  Your medical history, including your mental health history. This may involve tests to evaluate your mental health. You may be asked questions about your lifestyle, including any drug and alcohol use, and how long you have had symptoms of MDD.  A physical exam.  Blood tests to  rule out other conditions. You must have a depressed mood and at least four other MDD symptoms most of the day, nearly every day in the same 2-week timeframe before your health care provider can confirm a diagnosis of MDD. How is this treated? This condition is usually treated by mental health professionals, such as psychologists, psychiatrists, and clinical social workers. You may need more than one type of treatment. Treatment may include:  Psychotherapy. This is also called talk therapy or counseling. Types of psychotherapy include:  Cognitive behavioral therapy (CBT). This type of therapy teaches you to recognize unhealthy feelings, thoughts, and behaviors, and replace them with positive thoughts and actions.  Interpersonal therapy (IPT). This helps you to improve the way you relate to and communicate with others.  Family therapy. This treatment includes members of your family.  Medicine to treat anxiety and depression, or to help you control certain emotions and behaviors.  Lifestyle changes, such as:  Limiting alcohol and drug use.  Exercising regularly.  Getting plenty of sleep.  Making healthy eating choices.  Spending more time outdoors. Treatments involving stimulation of the brain can be used in situations with extremely severe symptoms, or when medicine   or other therapies do not work over time. These treatments include electroconvulsive therapy, transcranial magnetic stimulation, and vagal nerve stimulation. Follow these instructions at home: Activity   Return to your normal activities as told by your health care provider.  Exercise regularly and spend time outdoors as told by your health care provider. General instructions   Take over-the-counter and prescription medicines only as told by your health care provider.  Do not drink alcohol. If you drink alcohol, limit your alcohol intake to no more than 1 drink a day for nonpregnant women and 2 drinks a day for men. One  drink equals 12 oz of beer, 5 oz of wine, or 1 oz of hard liquor. Alcohol can affect any antidepressant medicines you are taking. Talk to your health care provider about your alcohol use.  Eat a healthy diet and get plenty of sleep.  Find activities that you enjoy doing, and make time to do them.  Consider joining a support group. Your health care provider may be able to recommend a support group.  Keep all follow-up visits as told by your health care provider. This is important. Where to find more information: [employer] on Mental Illness  www.nami.org [place] [employer] of Mental Health  http://www.maynard.net/ National Suicide Prevention Lifeline  1-800-273-TALK 607-763-1257). This is free, 24-hour help. Contact a health care provider if:  Your symptoms get worse.  You develop new symptoms. Get help right away if:  You self-harm.  You have serious thoughts about hurting yourself or others.  You see, hear, taste, smell, or feel things that are not present (hallucinate). This information is not intended to replace advice given to you by your health care provider. Make sure you discuss any questions you have with your health care provider. Document Released: 03/26/2013 Document Revised: 08/05/2016 Document Reviewed: 06/09/2016 Elsevier Interactive Patient Education  2017 [employer].

## 2017-03-09 NOTE — Progress Notes (Signed)
Pharmacy sent request for alternative to Cortisporin TC since it is obsolete.

## 2017-03-09 NOTE — Progress Notes (Signed)
Subjective:  Patient ID: [name] [name], female    DOB: 19-Jul-1981  Age: 36 y.o. MRN: [id number]  CC: change migraine medication   HPI [name] JesterRegina Paddack is a 36 y.o. female with a PMH of depression, anxiety, and migraine headache presents to request change of Sumatriptan to Rizatriptan. Felt Rizatriptan worked better for her. Continues to have migraine headache regularly several times per week. She reports not having taken Sertraline for depression and anxiety because she heard Sertraline may cause Alzheimer's disease. She has been feeling worse in regards to depression. Cites lack of concentration, extreme fatigue, and feelings of hopelessness. No suicidal/homicidal ideation or intent. Lastly, says she has right ear pain. Would like know if she has an infection. Does not endorse any other symptoms.    Outpatient Medications Prior to Visit  Medication Sig Dispense Refill  . sertraline (ZOLOFT) 50 MG tablet Take 1 tablet (50 mg total) by mouth daily. 30 tablet 3  . SUMAtriptan (IMITREX) 25 MG tablet Take 1 tablet (25 mg total) by mouth daily. May take one more tablet two hours after the first. No more than two tablets per day. 30 tablet 0   No facility-administered medications prior to visit.      ROS Review of Systems  Constitutional: Positive for malaise/fatigue. Negative for chills and fever.  HENT: Positive for ear pain.   Eyes: Negative for blurred vision.  Respiratory: Negative for shortness of breath.   Cardiovascular: Negative for chest pain and palpitations.  Gastrointestinal: Negative for abdominal pain and nausea.  Genitourinary: Negative for dysuria and hematuria.  Musculoskeletal: Negative for joint pain and myalgias.  Skin: Negative for rash.  Neurological: Positive for headaches. Negative for tingling.  Psychiatric/Behavioral: Positive for depression and memory loss. Negative for suicidal ideas. The patient is nervous/anxious.     Objective:  BP 137/86 (BP Location: Right  Arm, Patient Position: Sitting, Cuff Size: Normal)   Pulse 77   Temp 98.2 F (36.8 C) (Oral)   Wt 213 lb 9.6 oz (96.9 kg)   LMP 02/09/2017 (Approximate)   SpO2 98%   BMI 39.31 kg/m   BP/Weight 03/09/2017 03/05/2017 03/04/2017  Systolic BP 137 161 134  Diastolic BP 86 79 71  Wt. (Lbs) 213.6 - -  BMI 39.31 - -      Physical Exam  Constitutional: She is oriented to person, place, and time.  Well developed, obese, NAD, depressed  HENT:  Head: Normocephalic and atraumatic.  Eyes: No scleral icterus.  Neck: Normal range of motion. Neck supple. No thyromegaly present.  Cardiovascular: Normal rate, regular rhythm and normal heart sounds.   Pulmonary/Chest: Effort normal and breath sounds normal.  Abdominal: Soft. Bowel sounds are normal. There is no tenderness.  Musculoskeletal: She exhibits no edema.  Neurological: She is alert and oriented to person, place, and time.  Skin: Skin is warm and dry. No rash noted. No erythema. No pallor.  Psychiatric: Her behavior is normal. Thought content normal.  depressed  Vitals reviewed.    Assessment & Plan:   1. Right ear pain - Irritation of the ear canal. TMs normal - neomycin-colistin-hydrocortisone-thonzonium (CORTISPORIN-TC) 3.02-12-09-0.5 MG/ML otic suspension; Place 4 drops into both ears 3 (three) times daily.  Dispense: 10 mL; Refill: 0  2. Depression with anxiety - I have educated patient on the importance of taking Sertraline. I have reassured her about no link between Sertraline and Alzheimer's disease. Pt will consider taking Sertraline.   3. Non-compliance - To Sertraline. See above.  4. Migraine without status  migrainosus, not intractable, unspecified migraine type - Stop Sumatriptan - Rizatriptan (MAXALT) 10 MG tablet; Take 1 tablet (10 mg total) by mouth as needed for migraine. May repeat in 2 hours if needed  Dispense: 10 tablet; Refill: 5   Meds ordered this encounter  Medications  .  neomycin-colistin-hydrocortisone-thonzonium (CORTISPORIN-TC) 3.02-12-09-0.5 MG/ML otic suspension    Sig: Place 4 drops into both ears 3 (three) times daily.    Dispense:  10 mL    Refill:  0    Order Specific Question:   Supervising Provider    Answer:   Quentin Angst [id number]  . rizatriptan (MAXALT) 10 MG tablet    Sig: Take 1 tablet (10 mg total) by mouth as needed for migraine. May repeat in 2 hours if needed    Dispense:  10 tablet    Refill:  5    Order Specific Question:   Supervising Provider    Answer:   Quentin Angst [[id number]]    Follow-up: Return in about 4 weeks (around 04/06/2017) for f/u depression and anxiety.   Loletta Specter PA

## 2017-03-30 ENCOUNTER — Ambulatory Visit (HOSPITAL_COMMUNITY): Payer: Self-pay | Admitting: Psychology

## 2017-04-01 ENCOUNTER — Emergency Department (HOSPITAL_COMMUNITY)
Admission: EM | Admit: 2017-04-01 | Discharge: 2017-04-01 | Discharge status: Against Medical Advice | Payer: BLUE CROSS/BLUE SHIELD

## 2017-04-01 NOTE — ED Notes (Signed)
In room trying to triage patient.  Denies having any SI or HI.  Reports she was testing her husbands loyalty to see if he would come to the hospital.  Since he came she states I don't need to be seen.  Spoke with spouse.  Agrees that he feels safe taking patient home.  Encouraged to come back if needed.  Verbalizes understanding.

## 2017-04-04 ENCOUNTER — Ambulatory Visit (INDEPENDENT_AMBULATORY_CARE_PROVIDER_SITE_OTHER): Payer: Self-pay | Admitting: Physician Assistant

## 2017-04-20 ENCOUNTER — Telehealth (INDEPENDENT_AMBULATORY_CARE_PROVIDER_SITE_OTHER): Payer: Self-pay | Admitting: Physician Assistant

## 2017-04-20 NOTE — Telephone Encounter (Signed)
Patient was given this letter on 3/23. Patient came into clinic today and was given letter again. Maryjean Mornempestt S Roberts, CMA

## 2017-04-20 NOTE — Telephone Encounter (Signed)
Patient called stated needs a copy of TB test result.  Please call her when ready for pick up.

## 2017-04-25 ENCOUNTER — Ambulatory Visit (HOSPITAL_COMMUNITY)
Admission: RE | Admit: 2017-04-25 | Discharge: 2017-04-25 | Disposition: A | Discharge status: Home / Self Care | Payer: BLUE CROSS/BLUE SHIELD | Attending: Psychiatry | Admitting: Psychiatry

## 2017-04-25 DIAGNOSIS — Z1389 Encounter for screening for other disorder: Secondary | ICD-10-CM | POA: Diagnosis not present

## 2017-04-25 DIAGNOSIS — F329 Major depressive disorder, single episode, unspecified: Secondary | ICD-10-CM | POA: Insufficient documentation

## 2017-04-25 DIAGNOSIS — F419 Anxiety disorder, unspecified: Secondary | ICD-10-CM | POA: Insufficient documentation

## 2017-04-25 NOTE — H&P (Signed)
Behavioral Health Medical Screening Exam  [name] JesterRegina [name] is an 36 y.o. female who presented as a walk in for depression/anxiety. She denies suicidal ideation. Patient is interested in being referred for outpatient treatments.   Total Time spent with patient: 20 minutes  Psychiatric Specialty Exam: Physical Exam  Constitutional: She is oriented to person, place, and time. She appears well-developed and well-nourished.  HENT:  Head: Normocephalic and atraumatic.  Right Ear: External ear normal.  Left Ear: External ear normal.  Mouth/Throat: Oropharynx is clear and moist.  Eyes: Conjunctivae are normal. Pupils are equal, round, and reactive to light.  Neck: Normal range of motion. Neck supple.  Cardiovascular: Normal rate, regular rhythm, normal heart sounds and intact distal pulses.   Respiratory: Effort normal and breath sounds normal.  GI: Soft. Bowel sounds are normal.  Musculoskeletal: Normal range of motion.  Neurological: She is alert and oriented to person, place, and time.  Skin: Skin is warm and dry.    Review of Systems  Constitutional: Negative for chills, fever and weight loss.  HENT: Negative for ear discharge, ear pain, hearing loss, nosebleeds and tinnitus.   Respiratory: Negative for cough.   Cardiovascular: Negative for chest pain and palpitations.  Gastrointestinal: Negative for heartburn.  Skin: Negative for rash.  Psychiatric/Behavioral: Positive for depression. Negative for hallucinations, memory loss, substance abuse and suicidal ideas. The patient is nervous/anxious. The patient does not have insomnia.     Blood pressure 112/80, pulse 87, temperature 98.7 F (37.1 C), resp. rate 18.There is no height or weight on file to calculate BMI.  General Appearance: Casual  Eye Contact:  Good  Speech:  Clear and Coherent  Volume:  Normal  Mood:  Depressed  Affect:  Appropriate  Thought Process:  Coherent and Goal Directed  Orientation:  Full (Time, Place, and  Person)  Thought Content:  Recent stressors   Suicidal Thoughts:  No  Homicidal Thoughts:  No  Memory:  Immediate;   Good Recent;   Good Remote;   Good  Judgement:  Intact  Insight:  Present  Psychomotor Activity:  Normal  Concentration: Concentration: Good and Attention Span: Good  Recall:  Good  Fund of Knowledge:Good  Language: Good  Akathisia:  No  Handed:  Right  AIMS (if indicated):     Assets:  Communication Skills Desire for Improvement Housing Intimacy Leisure Time Physical Health Resilience Social Support  Sleep:       Musculoskeletal: Strength & Muscle Tone: within normal limits Gait & Station: normal Patient leans: N/A  Blood pressure 112/80, pulse 87, temperature 98.7 F (37.1 C), resp. rate 18.  Recommendations:  Based on my evaluation the patient does not appear to have an emergency medical condition.  Fransisca KaufmannAVIS, Chailyn Racette, NP 04/25/2017, 11:20 AM
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
## 2017-04-26 ENCOUNTER — Other Ambulatory Visit (HOSPITAL_COMMUNITY): Payer: BLUE CROSS/BLUE SHIELD

## 2017-04-27 ENCOUNTER — Other Ambulatory Visit (HOSPITAL_COMMUNITY): Payer: BLUE CROSS/BLUE SHIELD | Attending: Psychiatry | Admitting: Psychiatry

## 2017-04-27 ENCOUNTER — Encounter (HOSPITAL_COMMUNITY): Payer: Self-pay | Admitting: Psychiatry

## 2017-04-27 DIAGNOSIS — R4589 Other symptoms and signs involving emotional state: Secondary | ICD-10-CM | POA: Insufficient documentation

## 2017-04-27 DIAGNOSIS — F331 Major depressive disorder, recurrent, moderate: Secondary | ICD-10-CM | POA: Insufficient documentation
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
## 2017-04-28 ENCOUNTER — Other Ambulatory Visit (HOSPITAL_COMMUNITY): Payer: BLUE CROSS/BLUE SHIELD | Admitting: Psychiatry

## 2017-04-28 DIAGNOSIS — F331 Major depressive disorder, recurrent, moderate: Secondary | ICD-10-CM | POA: Diagnosis not present

## 2017-04-28 MED ORDER — ESZOPICLONE 3 MG PO TABS: 3.0000 mg | ORAL_TABLET | Freq: Every evening | ORAL | 0 refills | Status: AC | PRN

## 2017-04-28 MED ORDER — CITALOPRAM HYDROBROMIDE 20 MG PO TABS: 20.0000 mg | ORAL_TABLET | Freq: Every day | ORAL | 0 refills | Status: DC

## 2017-04-28 NOTE — Progress Notes (Signed)
Patient ID: [name] [name], female   DOB: 1981-01-07, 36 y.o.   MRN: [id number]  Complains of not sleeping at night.  Discussed trazodone, lunesta and zolpidem CR and decided to try Lunesta 3mg  hs # 30 no refills.

## 2017-04-28 NOTE — Progress Notes (Signed)
    Daily Group Progress Note  Program: IOP  Group Time: 9:00-12:00  Participation Level: Active  Behavioral Response: Appropriate  Type of Therapy:  Group Therapy  Summary of Progress: Pt.'s first day of group. Pt. Met with the case manager and psychiatrist. Pt. Shared with the group that she has been depressed for a very long time and has little hope that she will get better. Pt. Shared with the group that she does not have internal reasons for seeking treatment, but wanted to get better for her children. Pt. Participated in presentation and discussion about nutrition, exercise, and sleep hygiene facilitated by Earma Reading from the wellness department.    Nancie Neas, LPC

## 2017-04-28 NOTE — Progress Notes (Signed)
    Daily Group Progress Note  Program: IOP  Group Time: 9:00-12:00  Participation Level: Active  Behavioral Response: Appropriate  Type of Therapy:  Group Therapy  Summary of Progress: Pt. Presents with flat affect, depressed. Pt. Shared with the group that she feels "guilt", talked vaguely about something that she has done in her relationship and fears that it will end if she is found out. Pt. Discussed reluctance to participate in meditation and belief that it will not work for her. Pt. Participated in breath focused meditation and in yoga therapy facilitated by Forde RadonLeanne Yates, LPC.     Shaune PollackBrown, Richa Shor B, LPC

## 2017-04-29 ENCOUNTER — Other Ambulatory Visit (HOSPITAL_COMMUNITY): Payer: BLUE CROSS/BLUE SHIELD | Admitting: Licensed Clinical Social Worker

## 2017-04-29 DIAGNOSIS — F331 Major depressive disorder, recurrent, moderate: Secondary | ICD-10-CM | POA: Diagnosis not present

## 2017-05-02 ENCOUNTER — Other Ambulatory Visit (HOSPITAL_COMMUNITY): Payer: BLUE CROSS/BLUE SHIELD | Admitting: Psychiatry

## 2017-05-02 DIAGNOSIS — F331 Major depressive disorder, recurrent, moderate: Secondary | ICD-10-CM | POA: Diagnosis not present

## 2017-05-02 NOTE — Progress Notes (Signed)
    Daily Group Progress Note  Program: IOP  Group Time: 9:00-12:00  Participation Level: Minimal  Behavioral Response: Appropriate  Type of Therapy:  Group Therapy  Summary of Progress: Pt. Presents with flat affect and depressed mood. Pt. Shared that she feels "unrecognizable" to herself and that it was difficult to get out of bed this morning. Pt shared to decline further and was shut down in group. Pt chose to leave after 1 hour of group, stating she had a migraine. Pt denies SI/HI before leaving.     Donia GuilesJenny Ziasia Lenoir, LCSW

## 2017-05-03 ENCOUNTER — Other Ambulatory Visit (HOSPITAL_COMMUNITY): Payer: BLUE CROSS/BLUE SHIELD | Admitting: Psychiatry

## 2017-05-03 NOTE — Progress Notes (Signed)
    Daily Group Progress Note  Program: IOP  Group Time: 9:00-12:00  Participation Level: Active  Behavioral Response: Appropriate  Type of Therapy:  Group Therapy  Summary of Progress: Pt. Continues to present with flat affect, depressed, withdrawn from the group process. Pt. States that she only feels safe in the corner of the room. Pt. Was encouraged by the counselor and other group members to join the group circle but refuses to join. Pt. Was present for medication management group with the pharmacist.    Shaune PollackBrown, Jennifer B, [facility]

## 2017-05-04 ENCOUNTER — Telehealth (HOSPITAL_COMMUNITY): Payer: Self-pay | Admitting: Psychiatry

## 2017-05-04 ENCOUNTER — Other Ambulatory Visit (HOSPITAL_COMMUNITY): Payer: BLUE CROSS/BLUE SHIELD | Admitting: Psychiatry

## 2017-05-04 DIAGNOSIS — F331 Major depressive disorder, recurrent, moderate: Secondary | ICD-10-CM

## 2017-05-04 NOTE — Progress Notes (Signed)
[facility] IOP DISCHARGE NOTE  Patient:  [name] JesterRegina Dockham DOB:  [birth year]  Date of Admission: 04/27/2017  Date of Discharge: 05/04/2017  Reason for Admission:depression  IOP Course:attended and participated minimally.  Says the group was too big, she had a hard time with so many other people.  In some ways she felt worse and yesterday had suicidal thoughts for the first time.  She intentionally went to a lake with her young daughter and had a strong urge to walk into the lake and drown herself.  She was in the midst of a crying spell and her daughter was clinging to her.  She called her husband who kept her on the phone till he got there.  She wanted him to take the daughter and leave her there with the impulse to drown herself.  He would not leave her there and she got past the impulse but scared herself in that she had never had suicidal thinking herself.  She loves her children and would do nothing to hurt them and that scared her more that she was having these thoughts.  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   MRN: [id number]

## 2017-05-04 NOTE — Progress Notes (Signed)
Elon JesterRegina [name] is a 36 y.o. , employed, married, African American female who was referred per TTS; treatment for worsening depressive symptoms.   According to pt she has been depressed all her life.  No hx of psychiatric hospitalizations nor suicide attempts.  Currently, [facility]  Also, sees a psychiatrist via [facility].  Triggers:  No apparent stressors.  Pt's husband has been very concerned about her because she's been very isolative and not functioning.                                                                                     Patients Currently Reported Symptoms/Problems: Anhedonia, isolative, crying spells, no motivation, irritable, sadness, poor sleep, flucuating appetite, no energy, poor concentration, ruminating thoughts and anxiety.  Pt has attended 5 MH-IOP days, but has been requesting a higher level of care (PHP) due to increased symptoms.  "I feel that I would do better in a smaller group because I don't feel comfortable or do I relate to any of them." According to pt, she had a situation that scared her along with her husband yesterday.  Apparently, she was isolated at home yesterday and started having intrusive thoughts about leaving the home.  Pt called for a taxi to pick she and her young daughter up to transport them to a park with lakes.  Upon arrival, pt states the thought was to walk out into the lake to drown herself, but her daughter was holding onto her.  "Normally my daughter who loves water, would run around and play but she kept holding on to daughter."  Husband was able to encourage pt to get into his car.  A:  Due to incident yesterday, it is recommended that pt be transferred to another level of care (PHP).  Spoke to [employer]y T at [facility] who authorized ten PHP days.  Inform PHP team along with pt.  R:  Pt receptive.     Chestine SporeLARK, RITA, M.Ed, CNA

## 2017-05-05 ENCOUNTER — Other Ambulatory Visit (HOSPITAL_COMMUNITY): Payer: BLUE CROSS/BLUE SHIELD | Admitting: Licensed Clinical Social Worker

## 2017-05-05 ENCOUNTER — Other Ambulatory Visit (HOSPITAL_COMMUNITY): Payer: BLUE CROSS/BLUE SHIELD | Admitting: Specialist

## 2017-05-05 ENCOUNTER — Encounter (HOSPITAL_COMMUNITY): Payer: Self-pay | Admitting: Specialist

## 2017-05-05 ENCOUNTER — Other Ambulatory Visit (HOSPITAL_COMMUNITY): Payer: BLUE CROSS/BLUE SHIELD

## 2017-05-05 ENCOUNTER — Encounter (HOSPITAL_COMMUNITY): Payer: Self-pay

## 2017-05-05 ENCOUNTER — Emergency Department (HOSPITAL_COMMUNITY)
Admission: EM | Admit: 2017-05-05 | Discharge: 2017-05-05 | Disposition: A | Discharge status: Home / Self Care | Payer: BLUE CROSS/BLUE SHIELD | Attending: Emergency Medicine | Admitting: Emergency Medicine

## 2017-05-05 ENCOUNTER — Emergency Department (HOSPITAL_COMMUNITY): Payer: BLUE CROSS/BLUE SHIELD

## 2017-05-05 ENCOUNTER — Ambulatory Visit (HOSPITAL_COMMUNITY)
Admission: EM | Admit: 2017-05-05 | Discharge: 2017-05-05 | Disposition: A | Discharge status: Other Acute Inpatient Hospital | Payer: BLUE CROSS/BLUE SHIELD | Attending: Family Medicine | Admitting: Family Medicine

## 2017-05-05 DIAGNOSIS — H6121 Impacted cerumen, right ear: Secondary | ICD-10-CM | POA: Diagnosis not present

## 2017-05-05 DIAGNOSIS — R4589 Other symptoms and signs involving emotional state: Secondary | ICD-10-CM

## 2017-05-05 DIAGNOSIS — Z79899 Other long term (current) drug therapy: Secondary | ICD-10-CM | POA: Diagnosis not present

## 2017-05-05 DIAGNOSIS — I10 Essential (primary) hypertension: Secondary | ICD-10-CM | POA: Diagnosis not present

## 2017-05-05 DIAGNOSIS — R0789 Other chest pain: Secondary | ICD-10-CM | POA: Diagnosis not present

## 2017-05-05 DIAGNOSIS — F331 Major depressive disorder, recurrent, moderate: Secondary | ICD-10-CM | POA: Diagnosis not present

## 2017-05-05 DIAGNOSIS — F332 Major depressive disorder, recurrent severe without psychotic features: Secondary | ICD-10-CM

## 2017-05-05 DIAGNOSIS — H9201 Otalgia, right ear: Secondary | ICD-10-CM | POA: Diagnosis not present

## 2017-05-05 DIAGNOSIS — R079 Chest pain, unspecified: Secondary | ICD-10-CM | POA: Diagnosis present

## 2017-05-05 LAB — BASIC METABOLIC PANEL
Anion gap: 6 (ref 5–15)
BUN: 7 mg/dL (ref 6–20)
CHLORIDE: 108 mmol/L (ref 101–111)
CO2: 26 mmol/L (ref 22–32)
CREATININE: 0.85 mg/dL (ref 0.44–1.00)
Calcium: 8.7 mg/dL — ABNORMAL LOW (ref 8.9–10.3)
GFR calc non Af Amer: >60 mL/min (ref >60)
Glucose, Bld: 95 mg/dL (ref 65–99)
POTASSIUM: 3.7 mmol/L (ref 3.5–5.1)
SODIUM: 140 mmol/L (ref 135–145)

## 2017-05-05 LAB — CBC
HEMATOCRIT: 36.8 % (ref 36.0–46.0)
Hemoglobin: 12 g/dL (ref 12.0–15.0)
MCH: 26.4 pg (ref 26.0–34.0)
MCHC: 32.6 g/dL (ref 30.0–36.0)
MCV: 80.9 fL (ref 78.0–100.0)
PLATELETS: 317 10*3/uL (ref 150–400)
RBC: 4.55 MIL/uL (ref 3.87–5.11)
RDW: 13.6 % (ref 11.5–15.5)
WBC: 5.9 10*3/uL (ref 4.0–10.5)

## 2017-05-05 LAB — I-STAT TROPONIN, ED: Troponin i, poc: 0 ng/mL (ref 0.00–0.08)

## 2017-05-05 NOTE — ED Notes (Signed)
ED Provider at bedside. 

## 2017-05-05 NOTE — ED Triage Notes (Signed)
Pt arrives with left sided chest pain x 4 days. PT states when the pain is bad she feels short of breath and "stiff". Pt denies n/v, diaphoresis, dizziness. Endorses right ear pain as well

## 2017-05-05 NOTE — Therapy (Addendum)
[facility] PARTIAL HOSPITALIZATION PROGRAM [address] [place], [place], [address] Phone: [phone]   Fax:  [phone]  Occupational Therapy Evaluation  Patient Details  Name: [name] [name] MRN: [id number] Date of Birth: 16-Jul-1981 No Data Recorded  Encounter Date: 05/05/2017      OT End of Session - 05/05/17 1612    Visit Number 1   Number of Visits 6   Date for OT Re-Evaluation 05/26/17   Authorization Type [employer]     OT assessment Diagnosis:  Major Depression Past medical history:   Living situation:  Lives with husband and children ADLs:  Independent  Work:  Assisted living aide  Leisure:  Online shopping Social support:  husband Struggles:  Geophysical data processor with depression OT goal:  Learn new coping skills  General Causality Orientation Scale   Subscore Percentile Score  Autonomy 50 39.24  Control 50 48.30  Impersonal 32 8.78   Motivation Type  Motivation type Explanation  o    o Control-oriented The individual's behavior is likely determined by internal or external factors, and often associated with subjective feelings of "have-to" or sense of coercion.  Motivational interventions may benefit and prevent potential motivational deterioration over time.  o    o     Participated in stress management group this date, consisting of definition, causes, effects, coping mechanisms:  Relaxation, emotional regulation, exercise, music, nutrition.  Patient was engaged throughout group.    Assessment:  Patient demonstrates control oriented motivation type.  Patient will benefit from occupational therapy intervention in order to improve time management, financial management, stress management, job readiness skills, social skills,sleep hygiene, exercise and healthy eating habits,  and health management skills and other psychosocial skills needed for preparation to return to full time community living and to be a productive community member.   Plan:  Patient  will participate in skilled occupational therapy sessions individually or in a group setting to improve coping skills, psychosocial skills, and emotional skills required to return to prior level of function as a productive community member. Treatment will be 1-2 times per week for 2-6 weeks.       Past Medical History:  Diagnosis Date  . Depression    PTSD (1997) major depression (2013) - no meds  . Hypertension    Hx PIH with 2002 preg, no current problems  . Migraine    otc med prn  . Pregnant   . Sickle cell trait [facility])     Past Surgical History:  Procedure Laterality Date  . CESAREAN SECTION     x 6  . CESAREAN SECTION WITH BILATERAL TUBAL LIGATION Bilateral 03/02/2014   Procedure: REPEAT CESAREAN SECTION WITH BILATERAL TUBAL LIGATION;  Surgeon: Allie Bossier, MD;  Location: [facility]S;  Service: Obstetrics;  Laterality: Bilateral;  . TUBAL LIGATION    . WISDOM TOOTH EXTRACTION      There were no vitals filed for this visit.      Subjective Assessment - 05/05/17 1611    Currently in Pain? No/denies                            OT Education - 05/05/17 1612    Education provided Yes   Education Details coping skills for stress management   Person(s) Educated Patient   Methods Explanation;Handout   Comprehension Verbalized understanding          OT Short Term Goals - 05/05/17 [id number]  OT SHORT TERM GOAL #1   Title Patient will be educated on various coping skills and pyschosocial skills for community reintegration.   Time 3   Period Weeks   Status New     OT SHORT TERM GOAL #2   Title Patient will be educated on strategies to improve psychosocial skills needed to participate fully in all daily, work, and leisure activities.   Time 3   Period Weeks   Status New     OT SHORT TERM GOAL #3   Title Patient will independently apply psychosocial skills and coping mechanisms to her daily activities in order to function independently.   Time 3    Period Weeks                Patient will benefit from skilled therapeutic intervention in order to improve the following deficits and impairments:   coping skills and pyschosocial skills.    Visit Diagnosis: Severe recurrent major depression without psychotic features (HCC)  Difficulty coping    Problem List Patient Active Problem List   Diagnosis Date Noted  . Depression, major, recurrent, moderate (HCC) 04/27/2017    Class: Chronic  . Syncope 05/29/2016  . Chest pain 05/29/2016  . Leukocytosis 05/29/2016  . Depression 04/15/2014  . H/O tubal ligation 04/15/2014  . Obesity 04/15/2014  . S/P C-section 03/02/2014  . 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 Sickle cell trait (HCC) 10/01/2013  . Previous cesarean delivery, antepartum condition or complication 09/25/2013     05/05/2017, 9:40 PM  [facility] [address] [place], [place], [address] Phone: [phone]   Fax:  [phone]  Name: Elon JesterRegina Berkel MRN: [id number] Date of Birth: December 02, 1981

## 2017-05-05 NOTE — ED Provider Notes (Signed)
[facility]-EMERGENCY DEPT Provider Note   CSN: [id number] Arrival date & time: 05/05/17  1637  By signing my name below, I, Thelma Bargeick Cochran, attest that this documentation has been prepared under the direction and in the presence of Melene PlanFloyd, Akaiya Touchette, DO. Electronically Signed: Thelma BargeNick Cochran, Scribe. 05/05/17. 6:25 PM History   Chief Complaint Chief Complaint  Patient presents with  . Chest Pain   The history is provided by the patient. No language interpreter was used.  Chest Pain   This is a new problem. The current episode started more than 2 days ago. The problem occurs constantly. The problem has been gradually worsening. The pain is associated with breathing. The quality of the pain is described as brief and sharp. Pertinent negatives include no abdominal pain, no cough, no dizziness, no fever, no headaches, no nausea, no palpitations, no shortness of breath and no vomiting.    HPI Comments: [name] [name] is a 36 y.o. female who presents to the Emergency Department complaining of constant, sharp CP since 4 days that gradually worsened today. She states the CP makes her catch her breath for a second or two. When she stands up or breathes deeply, the pain worsens. She states lying down makes the pain subside. She has associated swelling in her left foot. She denies any injury to the area, fever, cough, congestion, history of blood clots, coughing up blood, swelling in leg, and abdominal pain. She also denies any recent surgeries or long travel periods.  She also complains of right ear pain since 2 days.  Past Medical History:  Diagnosis Date  . Depression    PTSD (1997) major depression (2013) - no meds  . Hypertension    Hx PIH with 2002 preg, no current problems  . Migraine    otc med prn  . Pregnant   . Sickle cell trait [facility](HCC)     Patient Active Problem List   Diagnosis Date Noted  . Depression, major, recurrent, moderate (HCC) 04/27/2017    Class: Chronic  . Syncope 05/29/2016  . Chest  pain 05/29/2016  . Leukocytosis 05/29/2016  . Depression 04/15/2014  . H/O tubal ligation 04/15/2014  . Obesity 04/15/2014  . S/P C-section 03/02/2014  . 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 Sickle cell trait (HCC) 10/01/2013  . Previous cesarean delivery, antepartum condition or complication 09/25/2013    Past Surgical History:  Procedure Laterality Date  . CESAREAN SECTION     x 6  . CESAREAN SECTION WITH BILATERAL TUBAL LIGATION Bilateral 03/02/2014   Procedure: REPEAT CESAREAN SECTION WITH BILATERAL TUBAL LIGATION;  Surgeon: Allie BossierMyra C Dove, MD;  Location: [facility];  Service: Obstetrics;  Laterality: Bilateral;  . TUBAL LIGATION    . WISDOM TOOTH EXTRACTION      OB History    Gravida Para Term Preterm AB Living   7 6 6   1 6    SAB TAB Ectopic Multiple Live Births   1       6       Home Medications    Prior to Admission medications   Medication Sig Start Date End Date Taking? Authorizing Provider  acetic acid (VOSOL) 2 % otic solution Place 4 drops into both ears 3 (three) times daily. 03/09/17   Loletta SpecterGomez, Roger David, PA-C  citalopram (CELEXA) 20 MG tablet Take 1 tablet (20 mg total) by mouth daily. 04/28/17 04/28/18  Benjaman Pottaylor, Gerald D, MD  Eszopiclone (ESZOPICLONE) 3 MG TABS Take 1 tablet (3 mg total) by mouth at bedtime as  needed. Take immediately before bedtime 04/28/17 04/28/18  Benjaman Pott, MD  neomycin-colistin-hydrocortisone-thonzonium (CORTISPORIN-TC) 3.02-12-09-0.5 MG/ML otic suspension Place 4 drops into both ears 3 (three) times daily. 03/09/17   Loletta Specter, PA-C  rizatriptan (MAXALT) 10 MG tablet Take 1 tablet (10 mg total) by mouth as needed for migraine. May repeat in 2 hours if needed 03/09/17   Loletta Specter, PA-C    Family History Family History  Problem Relation Age of Onset  . Hypertension Mother   . Hypertension Father   . Diabetes Father     Social History Social History  Substance Use Topics  . Smoking status: Never Smoker  .  Smokeless tobacco: Never Used  . Alcohol use No     Comment: ocassionaly before pregnancy     Allergies   Fish allergy and Penicillins   Review of Systems Review of Systems  Constitutional: Negative for chills and fever.  HENT: Positive for ear pain. Negative for congestion and rhinorrhea.   Eyes: Negative for redness and visual disturbance.  Respiratory: Negative for cough, shortness of breath and wheezing.   Cardiovascular: Positive for chest pain. Negative for palpitations and leg swelling.  Gastrointestinal: Negative for abdominal pain, nausea and vomiting.  Genitourinary: Negative for dysuria and urgency.  Musculoskeletal: Negative for arthralgias and myalgias.  Skin: Negative for pallor and wound.  Neurological: Negative for dizziness and headaches.     Physical Exam Updated Vital Signs BP (!) 134/92 (BP Location: Right Arm)   Pulse 72   Temp 98.8 F (37.1 C) (Oral)   Resp 14   Ht 5\' 4"  (1.626 m)   Wt 96.2 kg (212 lb)   LMP 05/05/2017   SpO2 99%   BMI 36.39 kg/m   Physical Exam  Constitutional: She is oriented to person, place, and time. She appears well-developed and well-nourished. No distress.  HENT:  Head: Normocephalic and atraumatic.  Wax to bilateral TMs  What is visualized is normal in appearance No pain with motion of pinnae    Eyes: EOM are normal. Pupils are equal, round, and reactive to light.  Neck: Normal range of motion. Neck supple.  Cardiovascular: Normal rate and regular rhythm.  Exam reveals no gallop and no friction rub.   No murmur heard. Pulmonary/Chest: Effort normal. She has no wheezes. She has no rales.   Clear lung sounds No edema  Abdominal: Soft. She exhibits no distension. There is no tenderness.  Musculoskeletal: She exhibits no edema or tenderness.  Left-sided chest wall pain about ribs 2-5 that reproduced her pain  Neurological: She is alert and oriented to person, place, and time.  Skin: Skin is warm and dry. She is not  diaphoretic.  Psychiatric: She has a normal mood and affect. Her behavior is normal.  Nursing note and vitals reviewed.    ED Treatments / Results  DIAGNOSTIC STUDIES: Oxygen Saturation is 100% on RA, normal by my interpretation.    COORDINATION OF CARE: 6:09 PM Discussed treatment plan with pt at bedside and pt agreed to plan.  Labs (all labs ordered are listed, but only abnormal results are displayed) Labs Reviewed  BASIC METABOLIC PANEL - Abnormal; Notable for the following:       Result Value   Calcium 8.7 (*)    All other components within normal limits  CBC  I-STAT TROPOININ, ED    EKG  EKG Interpretation  Date/Time:  Thursday May 05 2017 16:40:26 EDT Ventricular Rate:  83 PR Interval:  112 QRS Duration:  84 QT Interval:  378 QTC Calculation: 444 R Axis:   16 Text Interpretation:  Sinus rhythm with occasional Premature ventricular complexes Minimal voltage criteria for LVH, may be normal variant Borderline ECG No significant change since last tracing Confirmed by Horatio Bertz MD, DANIEL [phone]) on 05/05/2017 5:16:11 PM       Radiology Dg Chest 2 View  Result Date: 05/05/2017 CLINICAL DATA:  36 year old female with left chest pain progressive for 4 days. Shortness of breath today. EXAM: CHEST  2 VIEW COMPARISON:  05/29/2016 and earlier. FINDINGS: Lung volumes remain normal. Normal cardiac size and mediastinal contours. Visualized tracheal air column is within normal limits. The lungs are clear. No pneumothorax or pleural effusion. Thoracolumbar scoliosis Re demonstrated. No acute osseous abnormality identified. Negative visible bowel gas pattern. IMPRESSION: No acute cardiopulmonary abnormality. Electronically Signed   By: Odessa Fleming M.D.   On: 05/05/2017 17:47    Procedures Procedures (including critical care time)  Medications Ordered in ED Medications - No data to display   Initial Impression / Assessment and Plan / ED Course  I have reviewed the triage vital signs  and the nursing notes.  Pertinent labs & imaging results that were available during my care of the patient were reviewed by me and considered in my medical decision making (see chart for details).     36 yo F with a chief complaint of left-sided chest wall pain. Going on for the past 4 days. Reproduced on exam. Patient had an EKG chest x-ray and troponin ordered out in triage. All are unremarkable. The patient was also complaining of some right-sided ear pain. She has significant wax no signs of otitis externa or media. Will have the patient use drops over-the-counter to remove the wax. PCP follow-up.  6:39 PM:  I have discussed the diagnosis/risks/treatment options with the patient and believe the pt to be eligible for discharge home to follow-up with PCP. We also discussed returning to the ED immediately if new or worsening sx occur. We discussed the sx which are most concerning (e.g., sudden worsening pain, fever, inability to tolerate by mouth) that necessitate immediate return. Medications administered to the patient during their visit and any new prescriptions provided to the patient are listed below.  Medications given during this visit Medications - No data to display   The patient appears reasonably screen and/or stabilized for discharge and I doubt any other medical condition or other [facility] requiring further screening, evaluation, or treatment in the ED at this time prior to discharge.    Final Clinical Impressions(s) / ED Diagnoses   Final diagnoses:  Chest wall pain  Otalgia of right ear  Impacted cerumen of right ear    New Prescriptions New Prescriptions   No medications on file   I personally performed the services described in this documentation, which was scribed in my presence. The recorded information has been reviewed and is accurate.     Melene Plan, DO 05/05/17 [phone]

## 2017-05-05 NOTE — Discharge Instructions (Signed)
Take 4 over the counter ibuprofen tablets 3 times a day or 2 over-the-counter naproxen tablets twice a day for pain. Also take tylenol 1000mg(2 extra strength) four times a day.    
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
## 2017-05-05 NOTE — Addendum Note (Signed)
Addended by: Shirlean MylarMURRAY, BETHANY H on: 05/05/2017 09:50 PM   Modules accepted: Orders

## 2017-05-05 NOTE — Psych (Signed)
   [facility]  [name] JesterRegina [name] [id number]  Session Time: 9-2  Participation Level: Minimal  Behavioral Response: CasualLethargicDepressed  Type of Therapy: Group Therapy  Treatment Goals addressed: Coping  Interventions: CBT, DBT and Supportive  Summary:  9:00 - 10:30 Clinician led check-in regarding current stressors and situation, and review of patient completed daily inventory. Clinician utilized active listening and empathetic response and validated patient emotions. Clinician facilitated processing group on pertinent issues.  10:30 -11:30: OT Group 11:30 - 12:45 Clinician led psychotherapy group on how patients deal with symptoms of depression and anxiety and healthier alternatives. 12:45 - 1:50 Clinician introduced topic of "Cognitive Distortions". Patients identified cognitive distortions they often have and ways to combat these distortions. 1:50 - 2:00 Clinician led check-out. Clinician assessed for immediate needs, medication compliance and efficacy, and safety concerns.    Suicidal/Homicidal: Nowithout intent/plan  Therapist Response: [name] [name] is a 36 y.o. female who presents with with depression symptoms. Patient arrived within time allowed and reports she is feeling "depressed." Patient rates her mood at a 4 on a 1- 10 scale with 10 being great. Patient engaged in activity and discussion. Patient discussed cognitive distortions she frequently has and how to combat them. Patient has not demonstrated progress due to today being her first day in PHP. Patient denies SI/HI/self-harm thoughts at end of session.  Plan: Patient will continue in PHP and medication management while working on increasing mood stabilization.  Diagnosis: Severe recurrent major depression without psychotic features (HCC) [F33.2]    1. Severe recurrent major depression without psychotic features (HCC)       [name] [name], LPCA 05/05/2017

## 2017-05-05 NOTE — Therapy (Signed)
[facility] PARTIAL HOSPITALIZATION PROGRAM [address] [place], [place], [address] Phone: [phone]   Fax:  [phone]  Occupational Therapy Evaluation  Patient Details  Name: [name] [name] MRN: [id number] Date of Birth: [birth year] No Data Recorded  Encounter Date: 05/05/2017      OT End of Session - 05/05/17 1612    Visit Number 1   Number of Visits 6   Date for OT Re-Evaluation 05/26/17   Authorization Type [employer]      Past Medical History:  Diagnosis Date  . Depression    PTSD (1997) major depression (2013) - no meds  . Hypertension    Hx PIH with 2002 preg, no current problems  . Migraine    otc med prn  . Pregnant   . Sickle cell trait [facility])     Past Surgical History:  Procedure Laterality Date  . CESAREAN SECTION     x 6  . CESAREAN SECTION WITH BILATERAL TUBAL LIGATION Bilateral 03/02/2014   Procedure: REPEAT CESAREAN SECTION WITH BILATERAL TUBAL LIGATION;  Surgeon: Allie Bossier, MD;  Location: [facility] ORS;  Service: Obstetrics;  Laterality: Bilateral;  . TUBAL LIGATION    . WISDOM TOOTH EXTRACTION      There were no vitals filed for this visit.      Subjective Assessment - 05/05/17 1611    Currently in Pain? 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  There is clear connection between behavior and interest/personal goals.  Motivation is intact.  o Control-oriented The individual's behavior is likely determined by internal or external factors, and often associated with subjective feelings of "have-to" or sense of coercion.  Motivational interventions may benefit and prevent potential motivational  deterioration over time.  o Impersonal/amotivational There is a lack of connection between any of the individuals behavior and his or her personal goals.  The individual is likely in a passivity state or manifests non-goal-directed behavior.  This is considered a type of motivational deficit and warrants motivational interventions.  o Mixed The individual does not have a prominent motivational orientation.  Currently there is no clear theoretical explanation, and research shows this motivation type does not benefit from motivational interventions.   Assessment:  Patient demonstrates **motivation type.  Patient will benefit from occupational therapy intervention in order to improve time management, financial management, stress management, job readiness skills, social skills,sleep hygiene, exercise and healthy eating habits,  and health management skills and other psychosocial skills needed for preparation to return to full time community living and to be a productive community member.   Plan:  Patient will participate in skilled occupational therapy sessions individually or in a group setting to improve coping skills, psychosocial skills, and emotional skills required to return to prior level of function as a productive community member. Treatment will be 1-2 times per week for 2-6 weeks.                        OT Education - 05/05/17 1612    Education provided Yes   Education Details coping skills for stress management   Person(s) Educated  Patient   Methods Explanation;Handout   Comprehension Verbalized understanding          OT Short Term Goals - 05/05/17 1613      OT SHORT TERM GOAL #1   Title Patient will be educated on various coping skills and pyschosocial skills for community reintegration.   Time 3   Period Weeks   Status New     OT SHORT TERM GOAL #2   Title Patient will be educated on strategies to improve psychosocial skills needed to participate fully in all  daily, work, and leisure activities.   Time 3   Period Weeks   Status New     OT SHORT TERM GOAL #3   Title Patient will independently apply psychosocial skills and coping mechanisms to her daily activities in order to function independently.   Time 3   Period Weeks                Patient will benefit from skilled therapeutic intervention in order to improve the following deficits and impairments:     Visit Diagnosis: Severe recurrent major depression without psychotic features (HCC)  Difficulty coping    Problem List Patient Active Problem List   Diagnosis Date Noted  . Depression, major, recurrent, moderate (HCC) 04/27/2017    Class: Chronic  . Syncope 05/29/2016  . Chest pain 05/29/2016  . Leukocytosis 05/29/2016  . Depression 04/15/2014  . H/O tubal ligation 04/15/2014  . Obesity 04/15/2014  . S/P C-section 03/02/2014  . 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 Sickle cell trait (HCC) 10/01/2013  . Previous cesarean delivery, antepartum condition or complication 09/25/2013    Jacqualine CodeMurray, Dyanara Cozza Helene 05/05/2017, 4:16 PM  [facility] [address] [place], [place], [address] Phone: [phone]   Fax:  [phone]  Name: [name] JesterRegina [name] MRN: [id number] Date of Birth: 1981-09-11

## 2017-05-05 NOTE — Progress Notes (Signed)
Transfer note  I followed Ms [name] [name] in the IOP and recommended the transfer to Partial hospital due to the worsening of her depression to the point of suicidal ideation for the first time with a plan to drown herself. Diagnosis remains severe major depression, recurrent without psychosis  Plan is to continue citalopram 20 mg daily and Lunesta 3mg  hs

## 2017-05-06 ENCOUNTER — Other Ambulatory Visit (HOSPITAL_COMMUNITY): Payer: BLUE CROSS/BLUE SHIELD | Admitting: Psychiatry

## 2017-05-06 ENCOUNTER — Other Ambulatory Visit (HOSPITAL_COMMUNITY): Payer: BLUE CROSS/BLUE SHIELD

## 2017-05-06 ENCOUNTER — Encounter (HOSPITAL_COMMUNITY): Payer: Self-pay

## 2017-05-06 VITALS — BP 130/86 | HR 75 | Ht 64.0 in | Wt 214.0 lb

## 2017-05-06 DIAGNOSIS — F331 Major depressive disorder, recurrent, moderate: Secondary | ICD-10-CM | POA: Diagnosis not present

## 2017-05-06 DIAGNOSIS — F332 Major depressive disorder, recurrent severe without psychotic features: Secondary | ICD-10-CM

## 2017-05-06 NOTE — Psych (Addendum)
   [facility]  [name] JesterRegina Lerette [id number]  Session Time: 9-1  Participation Level: Minimal  Behavioral Response: CasualLethargicDepressed  Type of Therapy: Group Therapy  Treatment Goals addressed: Coping  Interventions: CBT, DBT and Supportive  Summary:  9:00 - 10:30 Clinician led check-in regarding current stressors and situation, and review of patient completed daily inventory. Clinician utilized active listening and empathetic response and validated patient emotions. Clinician facilitated processing group on pertinent issues.  10:30 -11:00: Pt led psychoeducation group on the tenets of healthy and unhealthy relationships. 11:00 - 12:00 Pt led activity on the relationships in our lives. 12:00 - 12:50 Clinician led psychotherapy group on what patients want out of a relationship and how past hurts play into our perception of relationships. 12:50 - 1:00 Clinician led check-out. Clinician assessed for immediate needs, medication compliance and efficacy, and safety concerns.    Suicidal/Homicidal: Nowithout intent/plan  Therapist Response: [name] JesterRegina Armas is a 36 y.o. female who presents with with depression symptoms. Patient arrived within time allowed and reports she is feeling "depressed." Patient rates her mood at a 2 on a 1- 10 scale with 10 being great. Patient engaged in activity and discussion. Patient discussed an argument with her husband. Patient was able to identify core qualities that are important to her for a relationship.  Patient has demonstrated some progress by participating more in group and being more open. Patient denies SI/HI/self-harm thoughts at end of session.  Plan: Patient will continue in PHP and medication management while working on increasing mood stabilization.  Diagnosis: Severe recurrent major depression without psychotic features (HCC) [F33.2]    1. Severe recurrent major depression without psychotic features (HCC)     Quinn AxeWhitney J  Valeri Sula, LPCA 05/06/2017

## 2017-05-06 NOTE — Progress Notes (Signed)
Met with patient today as she presents with flat affect, depressed mood and report her left foot has been hurting some for the past 3 days.  States she was seen in the ED 05/05/17 due to some chest discomfort and leg pain but was thought to be muscular related.  Patient denies any suicidal or homicidal ideations with denial of these in the past too but reported she felt she needed the program due to worsening symptoms of depression and feeling hopeless.  States she had to take a leave from work and cannot currently think about returning even though she knows keeping busy helps when she is depressed.  Patient stated her depression today was at a 10, anxiety at a 0 and hopelessness at a 10 but again denies any current suicidal or homicidal ideation with no plan or intent to harm self or others.  States her kids keep her going as has 62, three boy and three girls ranging  From 31-55 years of age.  States they and her husband are supportive and feels PHP may be helpful with managing depression as reports she has stopped medications and would like to not have to take medication to improve her mood.  Patient reports problems at times with sleep, only a few hours a night so discussed good sleep hygiene as does not currently want to try medication to assist.  Patient reports increased weight gain over the past 6 months to be is to be significant but has only changed 6 pounds since 07/20/16.  Patient agreed to consider medication to assist with mood and agreed to contact this nurse or PHP staff if any symptoms worsened or if ever began to have thoughts of wanting to harm self or others. Patient denies auditory or visual hallucinations and no other symptoms at this time.

## 2017-05-09 ENCOUNTER — Other Ambulatory Visit (HOSPITAL_COMMUNITY): Payer: Self-pay

## 2017-05-10 ENCOUNTER — Other Ambulatory Visit (HOSPITAL_COMMUNITY): Payer: BLUE CROSS/BLUE SHIELD

## 2017-05-10 ENCOUNTER — Other Ambulatory Visit (HOSPITAL_COMMUNITY): Payer: BLUE CROSS/BLUE SHIELD | Admitting: Psychiatry

## 2017-05-10 ENCOUNTER — Other Ambulatory Visit (HOSPITAL_COMMUNITY): Payer: BLUE CROSS/BLUE SHIELD | Admitting: Occupational Therapy

## 2017-05-10 DIAGNOSIS — F332 Major depressive disorder, recurrent severe without psychotic features: Secondary | ICD-10-CM

## 2017-05-10 DIAGNOSIS — F331 Major depressive disorder, recurrent, moderate: Secondary | ICD-10-CM | POA: Diagnosis not present

## 2017-05-10 NOTE — Therapy (Signed)
[facility] PARTIAL HOSPITALIZATION PROGRAM [address] [place], [place], [address] Phone: [phone]   Fax:  [phone]  Occupational Therapy Treatment  Patient Details  Name: Veida Spira MRN: [id number] Date of Birth: 18-Nov-1981 No Data Recorded  Encounter Date: 05/10/2017      OT End of Session - 05/10/17 1217    Visit Number 2   Number of Visits 6   Date for OT Re-Evaluation 05/26/17   Authorization Type [employer]   OT Start Time 1030   OT Stop Time 1135   OT Time Calculation (min) 65 min   Activity Tolerance Patient tolerated treatment well   Behavior During Therapy [facility] for tasks assessed/performed      Past Medical History:  Diagnosis Date  . Depression    PTSD (1997) major depression (2013) - no meds  . Hypertension    Hx PIH with 2002 preg, no current problems  . Migraine    otc med prn  . Pregnant   . Sickle cell trait [facility])     Past Surgical History:  Procedure Laterality Date  . CESAREAN SECTION     x 6  . CESAREAN SECTION WITH BILATERAL TUBAL LIGATION Bilateral 03/02/2014   Procedure: REPEAT CESAREAN SECTION WITH BILATERAL TUBAL LIGATION;  Surgeon: Allie Bossier, MD;  Location: [facility];  Service: Obstetrics;  Laterality: Bilateral;  . TUBAL LIGATION    . WISDOM TOOTH EXTRACTION      There were no vitals filed for this visit.      Subjective Assessment - 05/10/17 1217    Currently in Pain? No/denies      OT Treatment Session: Exercise   S:  "I take the stairs when I go in buildings." O:  Patient actively participated in the following skilled occupational therapy group this date:  Exercise: Discussed strategies to begin an exercises program, contraindications to exercise, and tips for incorporating exercise into daily routine. Pt participated in leading group in stretching and exercise activity, as well as brainstorming how to adapt exercises when necessary. Pt participated in exercise "scattegories" game focusing on  exercise, stress reduction, and health benefits of exercise. Pt participated in group discussions and activities throughout session.  A:  Patient participated in skilled occupational therapy group for exercise skills this date.  Patient was engaged and appears open to strategies introduced.  Patient is not actively exercising at this time, is interested in beginning an exercise routine.  Pt provided with tips for incorporating exercises into routine, at home workouts, benefits of exercise, and contraindications for exercise handouts. Pt provided with chart to begin documenting daily exercise-intentional exercise or non-intentional.    P:  Continue participation in skilled occupational therapy groups 1-2 times per week for 2 weeks in order to gain the necessary skills needed to return to full time community living and learn effective coping strategies to be a productive community resident.          OT Short Term Goals - 05/10/17 1219      OT SHORT TERM GOAL #1   Title Patient will be educated on various coping skills and pyschosocial skills for community reintegration.   Time 3   Period Weeks   Status On-going     OT SHORT TERM GOAL #2   Title Patient will be educated on strategies to improve psychosocial skills needed to participate fully in all daily, work, and leisure activities.   Time 3   Period Weeks   Status On-going  OT SHORT TERM GOAL #3   Title Patient will independently apply psychosocial skills and coping mechanisms to her daily activities in order to function independently.   Time 3   Period Weeks   Status On-going                  Plan - 05/10/17 1218    Occupational performance deficits (Please refer to evaluation for details): IADL's;Rest and Sleep;Education;Work;Play;Leisure;Social Participation;Other;ADL's  decreased coping skills, decreased psychosocial skills   Rehab Potential Good   OT Frequency 1x / week   OT Duration --  3 weeks   OT  Treatment/Interventions Self-care/ADL training  coping skills training, psychosocial skills training, community reintegration   Consulted and Agree with Plan of Care Patient      Patient will benefit from skilled therapeutic intervention in order to improve the following deficits and impairments:   decreased coping skills, decreased psychosocial skills  Visit Diagnosis: Severe recurrent major depression without psychotic features [facility](HCC)    Problem List Patient Active Problem List   Diagnosis Date Noted  . Depression, major, recurrent, moderate (HCC) 04/27/2017    Class: Chronic  . Syncope 05/29/2016  . Chest pain 05/29/2016  . Leukocytosis 05/29/2016  . Depression 04/15/2014  . H/O tubal ligation 04/15/2014  . Obesity 04/15/2014  . S/P C-section 03/02/2014  . 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 Sickle cell trait (HCC) 10/01/2013  . Previous cesarean delivery, antepartum condition or complication 09/25/2013   Ezra SitesLeslie Abiel Antrim, OTR/L  [phone] 05/10/2017, 12:20 PM  [facility] PARTIAL HOSPITAL[facility]ATION PROGRAM [address] [place], [place], [address] Phone: [phone]   Fax:  [phone]  Name: Elon JesterRegina Desantiago MRN: [id number] Date of Birth: Feb 17, 1981

## 2017-05-10 NOTE — Psych (Signed)
   [facility] PHP THERAPIST PROGRESS NOTE  Elon JesterRegina Dulski [phone]  Session Time: 9-2  Participation Level: Minimal  Behavioral Response: CasualLethargicDepressed  Type of Therapy: Group Therapy  Treatment Goals addressed: Coping  Interventions: CBT, DBT and Supportive  Summary:  9:00 - 10:30 Clinician led check-in regarding current stressors and situation, and review of patient completed daily inventory. Clinician utilized active listening and empathetic response and validated patient emotions. Clinician facilitated processing group on pertinent issues.  10:30 -11:30: OT Group 11:30 - 12:45 Clinician led psychotherapy group on how societal pressure and stigma impacts mental health.  12:45 - 1:50 Clinician introduced topic of "Boundaries". Patients discussed the difference between rigid, porous, and healthy boundaries. Patients identified different areas in life where boundaries need to be addressed. 1:50 - 2:00 Clinician led check-out. Clinician assessed for immediate needs, medication compliance and efficacy, and safety concerns.   Suicidal/Homicidal: Nowithout intent/plan  Therapist Response: Elon JesterRegina Trefry is a 36 y.o. female who presents with with depression symptoms. Patient arrived within time allowed and reports she is feeling "depressed." Patient rates her mood at a 4 on a 1- 10 scale with 10 being great. Patient engaged minimally in activity and discussion. Patient discussed trouble with surprises and husband.  Patient has demonstrated progress by participating more in group. Patient denies SI/HI/self-harm thoughts at end of session.  Plan: Patient will continue in PHP and medication management while working on increasing mood stabilization.  Diagnosis: Severe recurrent major depression without psychotic features (HCC) [F33.2]    1. Severe recurrent major depression without psychotic features (HCC)       Quinn AxeWhitney J Tauriel Scronce, LPCA 05/10/2017

## 2017-05-11 ENCOUNTER — Other Ambulatory Visit (HOSPITAL_COMMUNITY): Payer: BLUE CROSS/BLUE SHIELD

## 2017-05-11 ENCOUNTER — Other Ambulatory Visit (HOSPITAL_COMMUNITY): Payer: Self-pay

## 2017-05-12 ENCOUNTER — Other Ambulatory Visit (HOSPITAL_COMMUNITY): Payer: BLUE CROSS/BLUE SHIELD

## 2017-05-12 ENCOUNTER — Other Ambulatory Visit (HOSPITAL_COMMUNITY): Payer: BLUE CROSS/BLUE SHIELD | Admitting: Specialist

## 2017-05-12 ENCOUNTER — Other Ambulatory Visit (HOSPITAL_COMMUNITY): Payer: Self-pay | Admitting: Psychiatry

## 2017-05-12 DIAGNOSIS — H9201 Otalgia, right ear: Secondary | ICD-10-CM

## 2017-05-12 MED ORDER — CITALOPRAM HYDROBROMIDE 20 MG PO TABS: ORAL_TABLET | ORAL | 0 refills | Status: AC

## 2017-05-12 MED ORDER — ACETIC ACID 2 % OT SOLN: 4.0000 [drp] | Freq: Three times a day (TID) | OTIC | 0 refills | Status: AC

## 2017-05-12 NOTE — Progress Notes (Signed)
Progress note  Ms [name] [name] says she remains depressed.  Cannot see any benefit from the program but is somewhat optimistic that it will help.  Says there are no new issues causing stress.  Continues flat and depressed Plan:  Increase the citalopram to 40 mg daily .  Nothing new to try for sleep

## 2017-05-13 ENCOUNTER — Other Ambulatory Visit (HOSPITAL_COMMUNITY): Payer: Self-pay

## 2017-05-13 ENCOUNTER — Other Ambulatory Visit (HOSPITAL_COMMUNITY): Payer: BLUE CROSS/BLUE SHIELD

## 2017-05-16 ENCOUNTER — Other Ambulatory Visit (HOSPITAL_COMMUNITY): Payer: BLUE CROSS/BLUE SHIELD

## 2017-05-16 ENCOUNTER — Other Ambulatory Visit (HOSPITAL_COMMUNITY): Payer: Self-pay

## 2017-05-17 ENCOUNTER — Other Ambulatory Visit (HOSPITAL_COMMUNITY): Payer: BLUE CROSS/BLUE SHIELD

## 2017-05-17 ENCOUNTER — Ambulatory Visit (HOSPITAL_COMMUNITY): Payer: Self-pay

## 2017-05-17 ENCOUNTER — Other Ambulatory Visit (HOSPITAL_COMMUNITY): Payer: Self-pay

## 2017-05-17 ENCOUNTER — Encounter (HOSPITAL_COMMUNITY): Payer: Self-pay | Admitting: Emergency Medicine

## 2017-05-18 ENCOUNTER — Other Ambulatory Visit (HOSPITAL_COMMUNITY): Payer: Self-pay

## 2017-05-18 ENCOUNTER — Other Ambulatory Visit (HOSPITAL_COMMUNITY): Payer: BLUE CROSS/BLUE SHIELD

## 2017-05-18 NOTE — Psych (Signed)
  [facility] Psych Discharge Summary  [name] [name] [id number]  Admission date: 05/05/2017 Discharge date: 05/18/2017  Reason for admission: depression and not responding to IOP therapy  Progress in Program Toward Treatment Goals: none apparent.  Stopped attending and no return of phone calls but indicated the groups were not helping  Progress (rationale): [name] [name] did no seem to accept feedback in a way that led to understanding some of her behavior and feelings and did not see the coping skills training as helpful and then did not attend for the past week  Discharge Plan: will try to contact her for follow up if she responds to phone calls.  Current medication:  Citalopram 20 mg daily ( to early to see results).  Lunesta 3 mg hs ( not taking she said because it was not helping)    Carolanne GrumblingGerald Taylor, MD 05/18/2017

## 2017-05-19 ENCOUNTER — Other Ambulatory Visit (HOSPITAL_COMMUNITY): Payer: Self-pay

## 2017-05-19 ENCOUNTER — Ambulatory Visit (HOSPITAL_COMMUNITY): Payer: Self-pay

## 2017-05-19 ENCOUNTER — Other Ambulatory Visit (HOSPITAL_COMMUNITY): Payer: BLUE CROSS/BLUE SHIELD

## 2017-05-20 ENCOUNTER — Other Ambulatory Visit (HOSPITAL_COMMUNITY): Payer: Self-pay

## 2017-05-20 ENCOUNTER — Other Ambulatory Visit (HOSPITAL_COMMUNITY): Payer: BLUE CROSS/BLUE SHIELD

## 2017-05-23 ENCOUNTER — Other Ambulatory Visit (HOSPITAL_COMMUNITY): Payer: BLUE CROSS/BLUE SHIELD

## 2017-05-23 ENCOUNTER — Other Ambulatory Visit (HOSPITAL_COMMUNITY): Payer: Self-pay

## 2017-05-24 ENCOUNTER — Other Ambulatory Visit (HOSPITAL_COMMUNITY): Payer: Self-pay

## 2017-05-24 ENCOUNTER — Other Ambulatory Visit (HOSPITAL_COMMUNITY): Payer: BLUE CROSS/BLUE SHIELD

## 2017-05-24 ENCOUNTER — Ambulatory Visit (HOSPITAL_COMMUNITY): Payer: Self-pay

## 2017-05-25 ENCOUNTER — Other Ambulatory Visit (HOSPITAL_COMMUNITY): Payer: BLUE CROSS/BLUE SHIELD

## 2017-05-25 ENCOUNTER — Other Ambulatory Visit (HOSPITAL_COMMUNITY): Payer: Self-pay

## 2017-05-26 ENCOUNTER — Ambulatory Visit (HOSPITAL_COMMUNITY): Payer: Self-pay

## 2017-05-26 ENCOUNTER — Other Ambulatory Visit (HOSPITAL_COMMUNITY): Payer: BLUE CROSS/BLUE SHIELD

## 2017-05-26 ENCOUNTER — Other Ambulatory Visit (HOSPITAL_COMMUNITY): Payer: Self-pay

## 2017-05-27 ENCOUNTER — Other Ambulatory Visit (HOSPITAL_COMMUNITY): Payer: BLUE CROSS/BLUE SHIELD

## 2017-05-27 ENCOUNTER — Other Ambulatory Visit (HOSPITAL_COMMUNITY): Payer: Self-pay

## 2017-05-30 ENCOUNTER — Other Ambulatory Visit (HOSPITAL_COMMUNITY): Payer: BLUE CROSS/BLUE SHIELD

## 2017-05-30 ENCOUNTER — Other Ambulatory Visit (HOSPITAL_COMMUNITY): Payer: Self-pay

## 2017-05-31 ENCOUNTER — Other Ambulatory Visit (HOSPITAL_COMMUNITY): Payer: BLUE CROSS/BLUE SHIELD

## 2017-05-31 ENCOUNTER — Other Ambulatory Visit (HOSPITAL_COMMUNITY): Payer: Self-pay

## 2017-05-31 ENCOUNTER — Ambulatory Visit (HOSPITAL_COMMUNITY): Payer: Self-pay

## 2017-06-01 ENCOUNTER — Other Ambulatory Visit (HOSPITAL_COMMUNITY): Payer: Self-pay

## 2017-06-01 ENCOUNTER — Other Ambulatory Visit (HOSPITAL_COMMUNITY): Payer: BLUE CROSS/BLUE SHIELD

## 2017-06-02 ENCOUNTER — Other Ambulatory Visit (HOSPITAL_COMMUNITY): Payer: Self-pay

## 2017-06-02 ENCOUNTER — Other Ambulatory Visit (HOSPITAL_COMMUNITY): Payer: BLUE CROSS/BLUE SHIELD

## 2017-06-02 ENCOUNTER — Ambulatory Visit (HOSPITAL_COMMUNITY): Payer: Self-pay

## 2017-06-03 ENCOUNTER — Other Ambulatory Visit (HOSPITAL_COMMUNITY): Payer: Self-pay

## 2017-06-03 ENCOUNTER — Other Ambulatory Visit (HOSPITAL_COMMUNITY): Payer: BLUE CROSS/BLUE SHIELD

## 2017-06-06 ENCOUNTER — Other Ambulatory Visit (HOSPITAL_COMMUNITY): Payer: BLUE CROSS/BLUE SHIELD

## 2017-06-07 ENCOUNTER — Other Ambulatory Visit (HOSPITAL_COMMUNITY): Payer: BLUE CROSS/BLUE SHIELD

## 2017-06-08 ENCOUNTER — Other Ambulatory Visit (HOSPITAL_COMMUNITY): Payer: BLUE CROSS/BLUE SHIELD

## 2017-06-09 ENCOUNTER — Other Ambulatory Visit (HOSPITAL_COMMUNITY): Payer: BLUE CROSS/BLUE SHIELD

## 2017-06-10 ENCOUNTER — Other Ambulatory Visit (HOSPITAL_COMMUNITY): Payer: BLUE CROSS/BLUE SHIELD

## 2017-06-13 ENCOUNTER — Other Ambulatory Visit (HOSPITAL_COMMUNITY): Payer: BLUE CROSS/BLUE SHIELD

## 2017-06-14 ENCOUNTER — Other Ambulatory Visit (HOSPITAL_COMMUNITY): Payer: BLUE CROSS/BLUE SHIELD

## 2017-06-16 ENCOUNTER — Other Ambulatory Visit (HOSPITAL_COMMUNITY): Payer: BLUE CROSS/BLUE SHIELD

## 2017-06-17 ENCOUNTER — Other Ambulatory Visit (HOSPITAL_COMMUNITY): Payer: BLUE CROSS/BLUE SHIELD

## 2017-06-20 ENCOUNTER — Other Ambulatory Visit (HOSPITAL_COMMUNITY): Payer: BLUE CROSS/BLUE SHIELD

## 2017-06-21 ENCOUNTER — Other Ambulatory Visit (HOSPITAL_COMMUNITY): Payer: BLUE CROSS/BLUE SHIELD

## 2017-07-13 ENCOUNTER — Ambulatory Visit (INDEPENDENT_AMBULATORY_CARE_PROVIDER_SITE_OTHER): Payer: Self-pay | Admitting: Physician Assistant

## 2017-08-04 ENCOUNTER — Ambulatory Visit: Payer: BLUE CROSS/BLUE SHIELD

## 2017-08-04 ENCOUNTER — Encounter (INDEPENDENT_AMBULATORY_CARE_PROVIDER_SITE_OTHER): Payer: BLUE CROSS/BLUE SHIELD | Admitting: Podiatry

## 2017-08-04 DIAGNOSIS — M778 Other enthesopathies, not elsewhere classified: Secondary | ICD-10-CM

## 2017-08-04 DIAGNOSIS — M7752 Other enthesopathy of left foot: Secondary | ICD-10-CM

## 2017-08-04 DIAGNOSIS — M779 Enthesopathy, unspecified: Principal | ICD-10-CM

## 2017-08-04 NOTE — Progress Notes (Signed)
This encounter was created in error - please disregard.

## 2017-09-16 ENCOUNTER — Encounter: Payer: BLUE CROSS/BLUE SHIELD | Admitting: Obstetrics and Gynecology

## 2018-07-15 IMAGING — DX DG CHEST 2V
2 series · 2 of 2 positions shown · non-contrast
Comparison: 05/29/2016 and earlier.

CLINICAL DATA: 36-year-old female with left chest pain progressive
for 4 days. Shortness of breath today.

EXAM:
CHEST  2 VIEW

[chest pa]
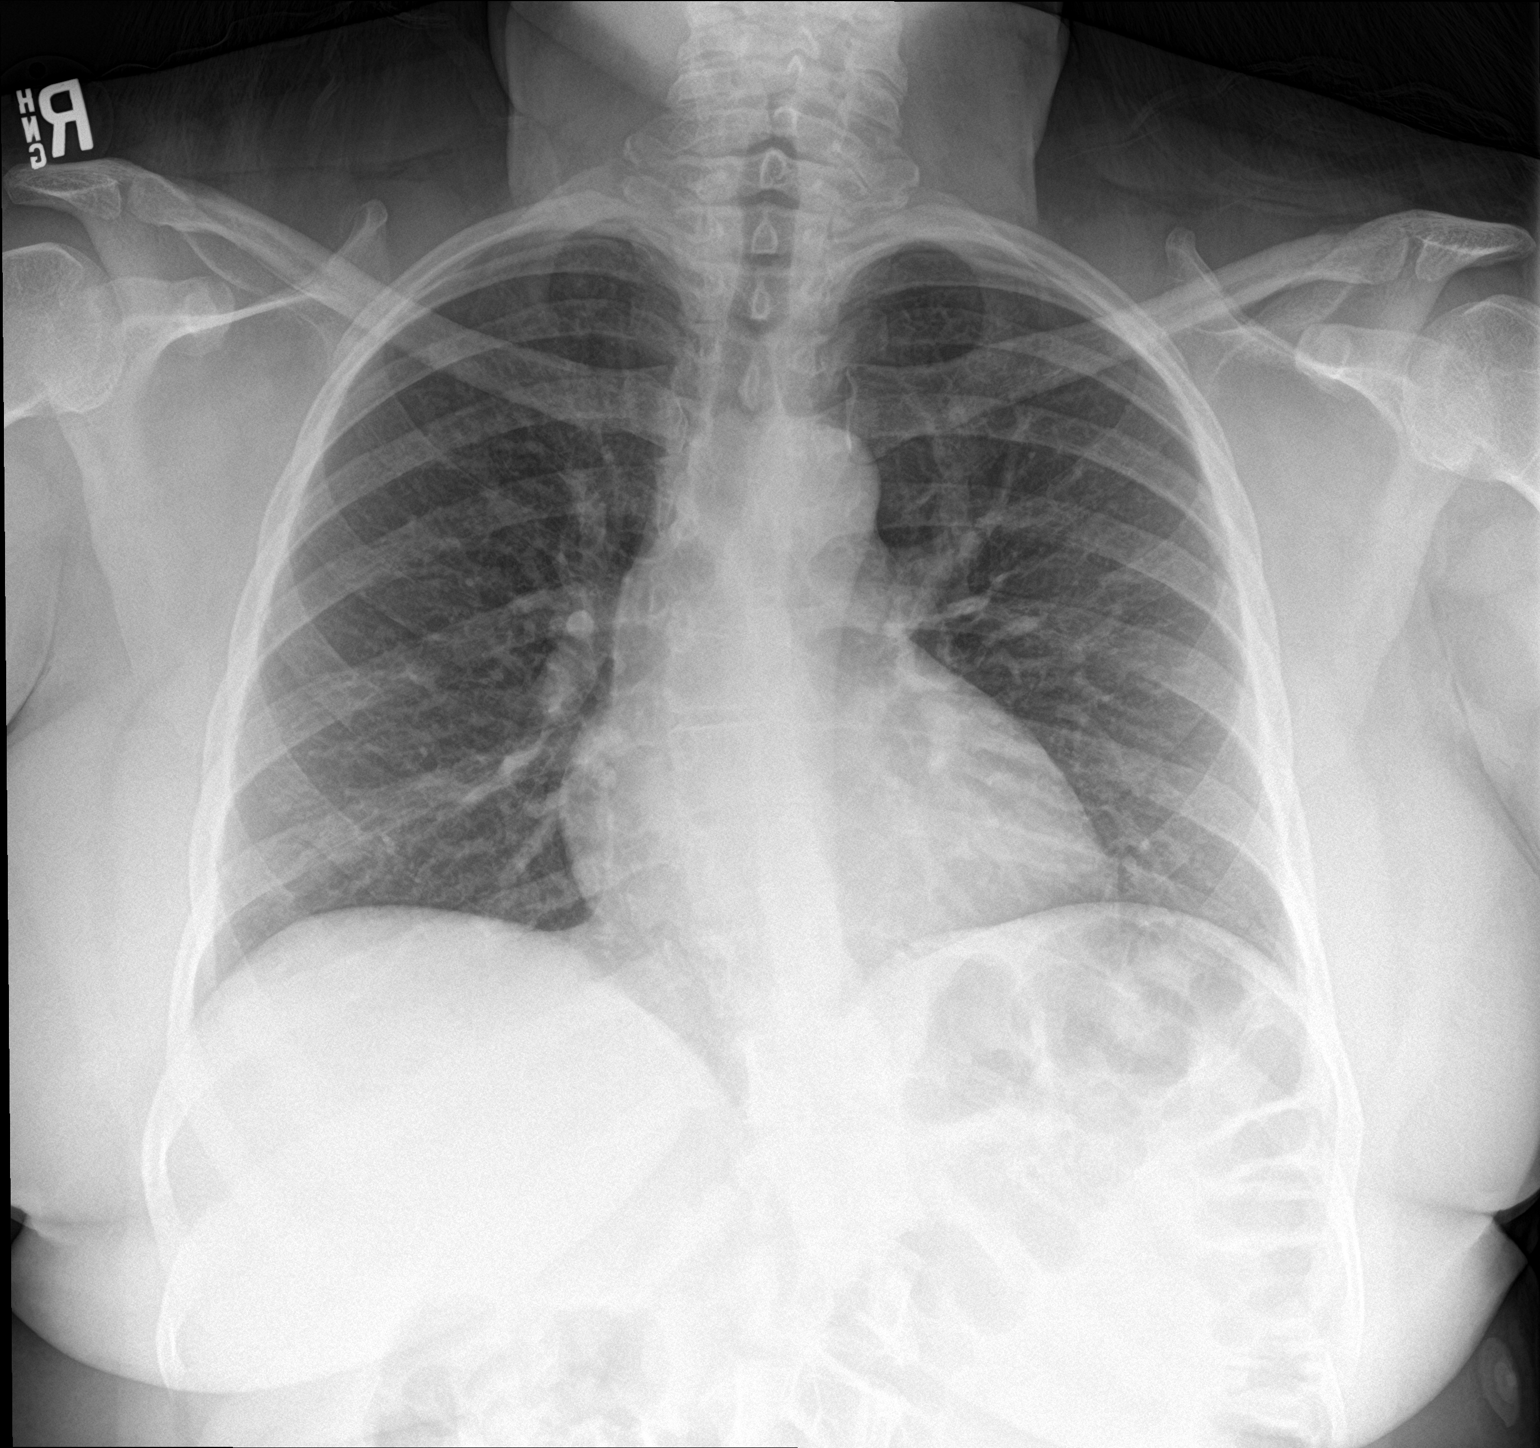

[chest lat]
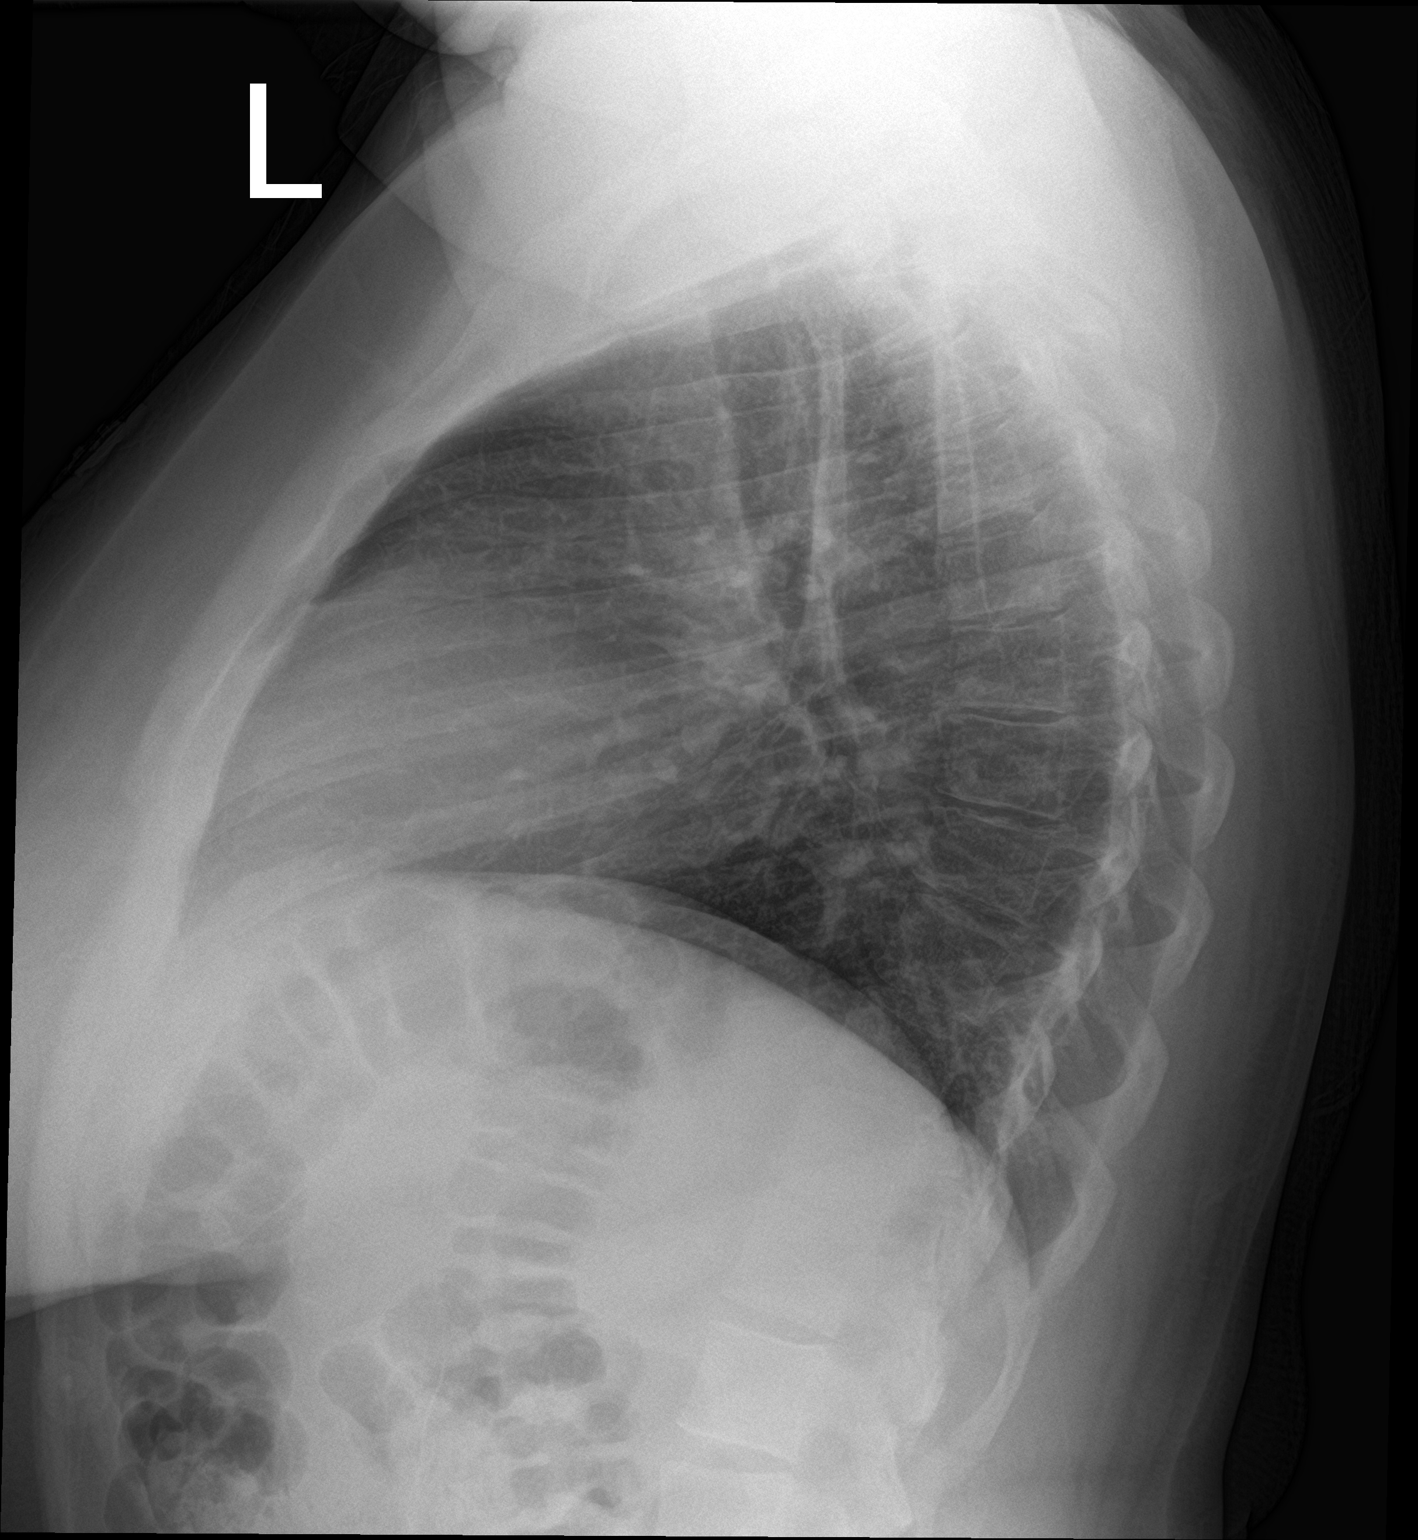

[2 of 2 positions shown; findings below may reference images not displayed]

FINDINGS: Lung volumes remain normal. Normal cardiac size and mediastinal
contours. Visualized tracheal air column is within normal limits.
The lungs are clear. No pneumothorax or pleural effusion.
Thoracolumbar scoliosis Re demonstrated. No acute osseous
abnormality identified. Negative visible bowel gas pattern.
IMPRESSION: No acute cardiopulmonary abnormality.
